# Patient Record
Sex: Male | Born: 1965 | Hispanic: No | Marital: Single | Smoking: Never smoker
Health system: Southern US, Community
[De-identification: ages and names within clinical notes are randomized; demographics above are authoritative.]

## PROBLEM LIST (undated history)

## (undated) DIAGNOSIS — G4730 Sleep apnea, unspecified: Secondary | ICD-10-CM

## (undated) DIAGNOSIS — L570 Actinic keratosis: Secondary | ICD-10-CM

## (undated) DIAGNOSIS — F419 Anxiety disorder, unspecified: Secondary | ICD-10-CM

## (undated) DIAGNOSIS — F32A Depression, unspecified: Secondary | ICD-10-CM

## (undated) DIAGNOSIS — G2581 Restless legs syndrome: Secondary | ICD-10-CM

## (undated) DIAGNOSIS — K219 Gastro-esophageal reflux disease without esophagitis: Secondary | ICD-10-CM

## (undated) DIAGNOSIS — F329 Major depressive disorder, single episode, unspecified: Secondary | ICD-10-CM

## (undated) DIAGNOSIS — F79 Unspecified intellectual disabilities: Secondary | ICD-10-CM

## (undated) HISTORY — PX: OTHER SURGICAL HISTORY: SHX169

## (undated) HISTORY — PX: NOSE SURGERY: SHX723

## (undated) HISTORY — DX: Sleep apnea, unspecified: G47.30

## (undated) HISTORY — DX: Anxiety disorder, unspecified: F41.9

## (undated) HISTORY — PX: COLONOSCOPY: SHX174

## (undated) HISTORY — DX: Restless legs syndrome: G25.81

## (undated) HISTORY — DX: Depression, unspecified: F32.A

## (undated) HISTORY — DX: Major depressive disorder, single episode, unspecified: F32.9

---
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
## 2004-04-20 ENCOUNTER — Ambulatory Visit: Payer: Self-pay | Admitting: Family Medicine

## 2004-04-28 ENCOUNTER — Ambulatory Visit: Payer: Self-pay | Admitting: Family Medicine

## 2004-05-28 ENCOUNTER — Ambulatory Visit: Payer: Self-pay | Admitting: Family Medicine

## 2004-06-30 ENCOUNTER — Ambulatory Visit: Payer: Self-pay | Admitting: General Surgery

## 2004-07-01 ENCOUNTER — Ambulatory Visit: Payer: Self-pay | Admitting: Family Medicine

## 2004-07-12 ENCOUNTER — Emergency Department: Payer: Self-pay | Admitting: Emergency Medicine

## 2004-11-03 ENCOUNTER — Ambulatory Visit: Payer: Self-pay | Admitting: Psychiatry

## 2008-05-09 ENCOUNTER — Ambulatory Visit: Payer: Self-pay | Admitting: Gastroenterology

## 2008-11-15 ENCOUNTER — Ambulatory Visit: Payer: Self-pay | Admitting: Family Medicine

## 2008-11-27 ENCOUNTER — Encounter: Payer: Self-pay | Admitting: Family Medicine

## 2010-05-08 ENCOUNTER — Ambulatory Visit: Payer: Self-pay

## 2010-05-28 ENCOUNTER — Ambulatory Visit: Payer: Self-pay | Admitting: Family Medicine

## 2010-07-26 ENCOUNTER — Emergency Department: Payer: Self-pay | Admitting: Emergency Medicine

## 2010-11-11 ENCOUNTER — Ambulatory Visit: Payer: Self-pay | Admitting: Neurology

## 2010-11-20 ENCOUNTER — Ambulatory Visit: Payer: Self-pay | Admitting: Neurology

## 2011-07-01 DIAGNOSIS — J309 Allergic rhinitis, unspecified: Secondary | ICD-10-CM | POA: Diagnosis not present

## 2011-07-06 DIAGNOSIS — M79609 Pain in unspecified limb: Secondary | ICD-10-CM | POA: Diagnosis not present

## 2011-07-06 DIAGNOSIS — B351 Tinea unguium: Secondary | ICD-10-CM | POA: Diagnosis not present

## 2011-07-08 DIAGNOSIS — J309 Allergic rhinitis, unspecified: Secondary | ICD-10-CM | POA: Diagnosis not present

## 2011-07-09 DIAGNOSIS — E559 Vitamin D deficiency, unspecified: Secondary | ICD-10-CM | POA: Diagnosis not present

## 2011-07-09 DIAGNOSIS — E782 Mixed hyperlipidemia: Secondary | ICD-10-CM | POA: Diagnosis not present

## 2011-07-09 DIAGNOSIS — Z0000 Encounter for general adult medical examination without abnormal findings: Secondary | ICD-10-CM | POA: Diagnosis not present

## 2011-07-15 DIAGNOSIS — J309 Allergic rhinitis, unspecified: Secondary | ICD-10-CM | POA: Diagnosis not present

## 2011-07-22 DIAGNOSIS — G4730 Sleep apnea, unspecified: Secondary | ICD-10-CM | POA: Diagnosis not present

## 2011-07-22 DIAGNOSIS — E785 Hyperlipidemia, unspecified: Secondary | ICD-10-CM | POA: Diagnosis not present

## 2011-07-22 DIAGNOSIS — F319 Bipolar disorder, unspecified: Secondary | ICD-10-CM | POA: Diagnosis not present

## 2011-07-22 DIAGNOSIS — G40309 Generalized idiopathic epilepsy and epileptic syndromes, not intractable, without status epilepticus: Secondary | ICD-10-CM | POA: Diagnosis not present

## 2011-07-22 DIAGNOSIS — R6250 Unspecified lack of expected normal physiological development in childhood: Secondary | ICD-10-CM | POA: Diagnosis not present

## 2011-07-22 DIAGNOSIS — Z0000 Encounter for general adult medical examination without abnormal findings: Secondary | ICD-10-CM | POA: Diagnosis not present

## 2011-07-22 DIAGNOSIS — J309 Allergic rhinitis, unspecified: Secondary | ICD-10-CM | POA: Diagnosis not present

## 2011-07-22 DIAGNOSIS — E559 Vitamin D deficiency, unspecified: Secondary | ICD-10-CM | POA: Diagnosis not present

## 2011-07-22 DIAGNOSIS — R5381 Other malaise: Secondary | ICD-10-CM | POA: Diagnosis not present

## 2011-07-22 DIAGNOSIS — G4710 Hypersomnia, unspecified: Secondary | ICD-10-CM | POA: Diagnosis not present

## 2011-07-29 DIAGNOSIS — J309 Allergic rhinitis, unspecified: Secondary | ICD-10-CM | POA: Diagnosis not present

## 2011-08-05 DIAGNOSIS — J309 Allergic rhinitis, unspecified: Secondary | ICD-10-CM | POA: Diagnosis not present

## 2011-08-12 DIAGNOSIS — J309 Allergic rhinitis, unspecified: Secondary | ICD-10-CM | POA: Diagnosis not present

## 2011-08-19 DIAGNOSIS — J309 Allergic rhinitis, unspecified: Secondary | ICD-10-CM | POA: Diagnosis not present

## 2011-08-26 DIAGNOSIS — J309 Allergic rhinitis, unspecified: Secondary | ICD-10-CM | POA: Diagnosis not present

## 2011-09-02 DIAGNOSIS — J309 Allergic rhinitis, unspecified: Secondary | ICD-10-CM | POA: Diagnosis not present

## 2011-09-07 DIAGNOSIS — J309 Allergic rhinitis, unspecified: Secondary | ICD-10-CM | POA: Diagnosis not present

## 2011-09-07 DIAGNOSIS — E669 Obesity, unspecified: Secondary | ICD-10-CM | POA: Diagnosis not present

## 2011-09-09 DIAGNOSIS — J309 Allergic rhinitis, unspecified: Secondary | ICD-10-CM | POA: Diagnosis not present

## 2011-09-16 DIAGNOSIS — J309 Allergic rhinitis, unspecified: Secondary | ICD-10-CM | POA: Diagnosis not present

## 2011-09-23 DIAGNOSIS — J309 Allergic rhinitis, unspecified: Secondary | ICD-10-CM | POA: Diagnosis not present

## 2011-09-30 DIAGNOSIS — J309 Allergic rhinitis, unspecified: Secondary | ICD-10-CM | POA: Diagnosis not present

## 2011-10-01 DIAGNOSIS — G4720 Circadian rhythm sleep disorder, unspecified type: Secondary | ICD-10-CM | POA: Diagnosis not present

## 2011-10-01 DIAGNOSIS — G4730 Sleep apnea, unspecified: Secondary | ICD-10-CM | POA: Diagnosis not present

## 2011-10-06 DIAGNOSIS — M79609 Pain in unspecified limb: Secondary | ICD-10-CM | POA: Diagnosis not present

## 2011-10-06 DIAGNOSIS — B351 Tinea unguium: Secondary | ICD-10-CM | POA: Diagnosis not present

## 2011-10-07 DIAGNOSIS — G4720 Circadian rhythm sleep disorder, unspecified type: Secondary | ICD-10-CM | POA: Diagnosis not present

## 2011-10-07 DIAGNOSIS — G4710 Hypersomnia, unspecified: Secondary | ICD-10-CM | POA: Diagnosis not present

## 2011-10-07 DIAGNOSIS — G4730 Sleep apnea, unspecified: Secondary | ICD-10-CM | POA: Diagnosis not present

## 2011-10-13 DIAGNOSIS — G4710 Hypersomnia, unspecified: Secondary | ICD-10-CM | POA: Diagnosis not present

## 2011-10-14 DIAGNOSIS — J309 Allergic rhinitis, unspecified: Secondary | ICD-10-CM | POA: Diagnosis not present

## 2011-10-19 DIAGNOSIS — G4733 Obstructive sleep apnea (adult) (pediatric): Secondary | ICD-10-CM | POA: Diagnosis not present

## 2011-10-21 DIAGNOSIS — G4710 Hypersomnia, unspecified: Secondary | ICD-10-CM | POA: Diagnosis not present

## 2011-10-21 DIAGNOSIS — G4730 Sleep apnea, unspecified: Secondary | ICD-10-CM | POA: Diagnosis not present

## 2011-10-21 DIAGNOSIS — F319 Bipolar disorder, unspecified: Secondary | ICD-10-CM | POA: Diagnosis not present

## 2011-10-21 DIAGNOSIS — E785 Hyperlipidemia, unspecified: Secondary | ICD-10-CM | POA: Diagnosis not present

## 2011-10-21 DIAGNOSIS — R6250 Unspecified lack of expected normal physiological development in childhood: Secondary | ICD-10-CM | POA: Diagnosis not present

## 2011-10-21 DIAGNOSIS — G40909 Epilepsy, unspecified, not intractable, without status epilepticus: Secondary | ICD-10-CM | POA: Diagnosis not present

## 2011-10-21 DIAGNOSIS — J309 Allergic rhinitis, unspecified: Secondary | ICD-10-CM | POA: Diagnosis not present

## 2011-10-28 DIAGNOSIS — J309 Allergic rhinitis, unspecified: Secondary | ICD-10-CM | POA: Diagnosis not present

## 2011-11-03 DIAGNOSIS — G4710 Hypersomnia, unspecified: Secondary | ICD-10-CM | POA: Diagnosis not present

## 2011-11-03 DIAGNOSIS — G4730 Sleep apnea, unspecified: Secondary | ICD-10-CM | POA: Diagnosis not present

## 2011-11-04 DIAGNOSIS — J309 Allergic rhinitis, unspecified: Secondary | ICD-10-CM | POA: Diagnosis not present

## 2011-11-18 DIAGNOSIS — J309 Allergic rhinitis, unspecified: Secondary | ICD-10-CM | POA: Diagnosis not present

## 2011-11-25 DIAGNOSIS — G4720 Circadian rhythm sleep disorder, unspecified type: Secondary | ICD-10-CM | POA: Diagnosis not present

## 2011-11-25 DIAGNOSIS — G4730 Sleep apnea, unspecified: Secondary | ICD-10-CM | POA: Diagnosis not present

## 2011-11-29 DIAGNOSIS — F319 Bipolar disorder, unspecified: Secondary | ICD-10-CM | POA: Diagnosis not present

## 2011-11-29 DIAGNOSIS — J309 Allergic rhinitis, unspecified: Secondary | ICD-10-CM | POA: Diagnosis not present

## 2011-11-29 DIAGNOSIS — Z0000 Encounter for general adult medical examination without abnormal findings: Secondary | ICD-10-CM | POA: Diagnosis not present

## 2011-11-29 DIAGNOSIS — R300 Dysuria: Secondary | ICD-10-CM | POA: Diagnosis not present

## 2011-11-29 DIAGNOSIS — G40309 Generalized idiopathic epilepsy and epileptic syndromes, not intractable, without status epilepticus: Secondary | ICD-10-CM | POA: Diagnosis not present

## 2011-11-29 DIAGNOSIS — E785 Hyperlipidemia, unspecified: Secondary | ICD-10-CM | POA: Diagnosis not present

## 2011-11-29 DIAGNOSIS — G4733 Obstructive sleep apnea (adult) (pediatric): Secondary | ICD-10-CM | POA: Diagnosis not present

## 2011-12-01 DIAGNOSIS — M722 Plantar fascial fibromatosis: Secondary | ICD-10-CM | POA: Diagnosis not present

## 2011-12-02 DIAGNOSIS — J309 Allergic rhinitis, unspecified: Secondary | ICD-10-CM | POA: Diagnosis not present

## 2011-12-16 DIAGNOSIS — J309 Allergic rhinitis, unspecified: Secondary | ICD-10-CM | POA: Diagnosis not present

## 2011-12-29 DIAGNOSIS — J309 Allergic rhinitis, unspecified: Secondary | ICD-10-CM | POA: Diagnosis not present

## 2011-12-29 DIAGNOSIS — M722 Plantar fascial fibromatosis: Secondary | ICD-10-CM | POA: Diagnosis not present

## 2012-01-12 DIAGNOSIS — B351 Tinea unguium: Secondary | ICD-10-CM | POA: Diagnosis not present

## 2012-01-12 DIAGNOSIS — M79609 Pain in unspecified limb: Secondary | ICD-10-CM | POA: Diagnosis not present

## 2012-01-13 DIAGNOSIS — J309 Allergic rhinitis, unspecified: Secondary | ICD-10-CM | POA: Diagnosis not present

## 2012-01-28 DIAGNOSIS — E785 Hyperlipidemia, unspecified: Secondary | ICD-10-CM | POA: Diagnosis not present

## 2012-01-28 DIAGNOSIS — G40309 Generalized idiopathic epilepsy and epileptic syndromes, not intractable, without status epilepticus: Secondary | ICD-10-CM | POA: Diagnosis not present

## 2012-01-28 DIAGNOSIS — R6250 Unspecified lack of expected normal physiological development in childhood: Secondary | ICD-10-CM | POA: Diagnosis not present

## 2012-01-28 DIAGNOSIS — J309 Allergic rhinitis, unspecified: Secondary | ICD-10-CM | POA: Diagnosis not present

## 2012-01-28 DIAGNOSIS — R5381 Other malaise: Secondary | ICD-10-CM | POA: Diagnosis not present

## 2012-01-28 DIAGNOSIS — G4733 Obstructive sleep apnea (adult) (pediatric): Secondary | ICD-10-CM | POA: Diagnosis not present

## 2012-01-28 DIAGNOSIS — R5383 Other fatigue: Secondary | ICD-10-CM | POA: Diagnosis not present

## 2012-02-09 DIAGNOSIS — G4730 Sleep apnea, unspecified: Secondary | ICD-10-CM | POA: Diagnosis not present

## 2012-02-09 DIAGNOSIS — G4710 Hypersomnia, unspecified: Secondary | ICD-10-CM | POA: Diagnosis not present

## 2012-02-10 DIAGNOSIS — J309 Allergic rhinitis, unspecified: Secondary | ICD-10-CM | POA: Diagnosis not present

## 2012-02-16 DIAGNOSIS — G4720 Circadian rhythm sleep disorder, unspecified type: Secondary | ICD-10-CM | POA: Diagnosis not present

## 2012-02-16 DIAGNOSIS — G4710 Hypersomnia, unspecified: Secondary | ICD-10-CM | POA: Diagnosis not present

## 2012-02-16 DIAGNOSIS — G4730 Sleep apnea, unspecified: Secondary | ICD-10-CM | POA: Diagnosis not present

## 2012-03-02 DIAGNOSIS — J309 Allergic rhinitis, unspecified: Secondary | ICD-10-CM | POA: Diagnosis not present

## 2012-03-09 DIAGNOSIS — G4720 Circadian rhythm sleep disorder, unspecified type: Secondary | ICD-10-CM | POA: Diagnosis not present

## 2012-03-09 DIAGNOSIS — G4710 Hypersomnia, unspecified: Secondary | ICD-10-CM | POA: Diagnosis not present

## 2012-03-15 DIAGNOSIS — G4730 Sleep apnea, unspecified: Secondary | ICD-10-CM | POA: Diagnosis not present

## 2012-03-16 DIAGNOSIS — J309 Allergic rhinitis, unspecified: Secondary | ICD-10-CM | POA: Diagnosis not present

## 2012-03-30 DIAGNOSIS — J309 Allergic rhinitis, unspecified: Secondary | ICD-10-CM | POA: Diagnosis not present

## 2012-04-13 DIAGNOSIS — J309 Allergic rhinitis, unspecified: Secondary | ICD-10-CM | POA: Diagnosis not present

## 2012-04-21 DIAGNOSIS — F3112 Bipolar disorder, current episode manic without psychotic features, moderate: Secondary | ICD-10-CM | POA: Diagnosis not present

## 2012-04-26 DIAGNOSIS — M79609 Pain in unspecified limb: Secondary | ICD-10-CM | POA: Diagnosis not present

## 2012-04-26 DIAGNOSIS — L97509 Non-pressure chronic ulcer of other part of unspecified foot with unspecified severity: Secondary | ICD-10-CM | POA: Diagnosis not present

## 2012-04-26 DIAGNOSIS — Q666 Other congenital valgus deformities of feet: Secondary | ICD-10-CM | POA: Diagnosis not present

## 2012-04-26 DIAGNOSIS — M216X9 Other acquired deformities of unspecified foot: Secondary | ICD-10-CM | POA: Diagnosis not present

## 2012-04-26 DIAGNOSIS — B351 Tinea unguium: Secondary | ICD-10-CM | POA: Diagnosis not present

## 2012-04-26 DIAGNOSIS — M76829 Posterior tibial tendinitis, unspecified leg: Secondary | ICD-10-CM | POA: Diagnosis not present

## 2012-04-27 DIAGNOSIS — J309 Allergic rhinitis, unspecified: Secondary | ICD-10-CM | POA: Diagnosis not present

## 2012-05-11 DIAGNOSIS — J309 Allergic rhinitis, unspecified: Secondary | ICD-10-CM | POA: Diagnosis not present

## 2012-05-24 DIAGNOSIS — J309 Allergic rhinitis, unspecified: Secondary | ICD-10-CM | POA: Diagnosis not present

## 2012-05-30 DIAGNOSIS — G40309 Generalized idiopathic epilepsy and epileptic syndromes, not intractable, without status epilepticus: Secondary | ICD-10-CM | POA: Diagnosis not present

## 2012-05-30 DIAGNOSIS — E785 Hyperlipidemia, unspecified: Secondary | ICD-10-CM | POA: Diagnosis not present

## 2012-05-30 DIAGNOSIS — G4710 Hypersomnia, unspecified: Secondary | ICD-10-CM | POA: Diagnosis not present

## 2012-05-30 DIAGNOSIS — G40909 Epilepsy, unspecified, not intractable, without status epilepticus: Secondary | ICD-10-CM | POA: Diagnosis not present

## 2012-05-30 DIAGNOSIS — G4733 Obstructive sleep apnea (adult) (pediatric): Secondary | ICD-10-CM | POA: Diagnosis not present

## 2012-05-30 DIAGNOSIS — R5383 Other fatigue: Secondary | ICD-10-CM | POA: Diagnosis not present

## 2012-06-15 DIAGNOSIS — J309 Allergic rhinitis, unspecified: Secondary | ICD-10-CM | POA: Diagnosis not present

## 2012-06-29 DIAGNOSIS — J309 Allergic rhinitis, unspecified: Secondary | ICD-10-CM | POA: Diagnosis not present

## 2012-07-13 DIAGNOSIS — J309 Allergic rhinitis, unspecified: Secondary | ICD-10-CM | POA: Diagnosis not present

## 2012-07-20 DIAGNOSIS — G4730 Sleep apnea, unspecified: Secondary | ICD-10-CM | POA: Diagnosis not present

## 2012-07-20 DIAGNOSIS — G4710 Hypersomnia, unspecified: Secondary | ICD-10-CM | POA: Diagnosis not present

## 2012-07-20 DIAGNOSIS — G4720 Circadian rhythm sleep disorder, unspecified type: Secondary | ICD-10-CM | POA: Diagnosis not present

## 2012-07-20 DIAGNOSIS — G4733 Obstructive sleep apnea (adult) (pediatric): Secondary | ICD-10-CM | POA: Diagnosis not present

## 2012-07-20 DIAGNOSIS — G2581 Restless legs syndrome: Secondary | ICD-10-CM | POA: Diagnosis not present

## 2012-07-27 DIAGNOSIS — J309 Allergic rhinitis, unspecified: Secondary | ICD-10-CM | POA: Diagnosis not present

## 2012-08-02 DIAGNOSIS — B351 Tinea unguium: Secondary | ICD-10-CM | POA: Diagnosis not present

## 2012-08-02 DIAGNOSIS — M79609 Pain in unspecified limb: Secondary | ICD-10-CM | POA: Diagnosis not present

## 2012-08-07 DIAGNOSIS — G4710 Hypersomnia, unspecified: Secondary | ICD-10-CM | POA: Diagnosis not present

## 2012-08-07 DIAGNOSIS — G40309 Generalized idiopathic epilepsy and epileptic syndromes, not intractable, without status epilepticus: Secondary | ICD-10-CM | POA: Diagnosis not present

## 2012-08-07 DIAGNOSIS — I10 Essential (primary) hypertension: Secondary | ICD-10-CM | POA: Diagnosis not present

## 2012-08-07 DIAGNOSIS — E069 Thyroiditis, unspecified: Secondary | ICD-10-CM | POA: Diagnosis not present

## 2012-08-07 DIAGNOSIS — R6250 Unspecified lack of expected normal physiological development in childhood: Secondary | ICD-10-CM | POA: Diagnosis not present

## 2012-08-07 DIAGNOSIS — R1310 Dysphagia, unspecified: Secondary | ICD-10-CM | POA: Diagnosis not present

## 2012-08-07 DIAGNOSIS — G4730 Sleep apnea, unspecified: Secondary | ICD-10-CM | POA: Diagnosis not present

## 2012-08-07 DIAGNOSIS — R269 Unspecified abnormalities of gait and mobility: Secondary | ICD-10-CM | POA: Diagnosis not present

## 2012-08-17 DIAGNOSIS — J309 Allergic rhinitis, unspecified: Secondary | ICD-10-CM | POA: Diagnosis not present

## 2012-08-24 ENCOUNTER — Ambulatory Visit: Payer: Self-pay

## 2012-08-24 DIAGNOSIS — R1310 Dysphagia, unspecified: Secondary | ICD-10-CM | POA: Diagnosis not present

## 2012-08-31 DIAGNOSIS — J309 Allergic rhinitis, unspecified: Secondary | ICD-10-CM | POA: Diagnosis not present

## 2012-09-05 DIAGNOSIS — R1310 Dysphagia, unspecified: Secondary | ICD-10-CM | POA: Diagnosis not present

## 2012-09-05 DIAGNOSIS — E069 Thyroiditis, unspecified: Secondary | ICD-10-CM | POA: Diagnosis not present

## 2012-09-08 DIAGNOSIS — E039 Hypothyroidism, unspecified: Secondary | ICD-10-CM | POA: Diagnosis not present

## 2012-09-08 DIAGNOSIS — F3112 Bipolar disorder, current episode manic without psychotic features, moderate: Secondary | ICD-10-CM | POA: Diagnosis not present

## 2012-09-14 DIAGNOSIS — J309 Allergic rhinitis, unspecified: Secondary | ICD-10-CM | POA: Diagnosis not present

## 2012-09-28 DIAGNOSIS — J309 Allergic rhinitis, unspecified: Secondary | ICD-10-CM | POA: Diagnosis not present

## 2012-10-02 DIAGNOSIS — R1310 Dysphagia, unspecified: Secondary | ICD-10-CM | POA: Diagnosis not present

## 2012-10-02 DIAGNOSIS — E782 Mixed hyperlipidemia: Secondary | ICD-10-CM | POA: Diagnosis not present

## 2012-10-02 DIAGNOSIS — G2581 Restless legs syndrome: Secondary | ICD-10-CM | POA: Diagnosis not present

## 2012-10-02 DIAGNOSIS — E069 Thyroiditis, unspecified: Secondary | ICD-10-CM | POA: Diagnosis not present

## 2012-10-03 DIAGNOSIS — E039 Hypothyroidism, unspecified: Secondary | ICD-10-CM | POA: Diagnosis not present

## 2012-10-05 ENCOUNTER — Ambulatory Visit: Payer: Self-pay | Admitting: Internal Medicine

## 2012-10-12 DIAGNOSIS — J309 Allergic rhinitis, unspecified: Secondary | ICD-10-CM | POA: Diagnosis not present

## 2012-10-17 DIAGNOSIS — R6889 Other general symptoms and signs: Secondary | ICD-10-CM | POA: Diagnosis not present

## 2012-10-17 DIAGNOSIS — K219 Gastro-esophageal reflux disease without esophagitis: Secondary | ICD-10-CM | POA: Diagnosis not present

## 2012-10-17 DIAGNOSIS — H9319 Tinnitus, unspecified ear: Secondary | ICD-10-CM | POA: Diagnosis not present

## 2012-10-17 DIAGNOSIS — E041 Nontoxic single thyroid nodule: Secondary | ICD-10-CM | POA: Diagnosis not present

## 2012-10-17 DIAGNOSIS — R1310 Dysphagia, unspecified: Secondary | ICD-10-CM | POA: Diagnosis not present

## 2012-10-25 DIAGNOSIS — M79609 Pain in unspecified limb: Secondary | ICD-10-CM | POA: Diagnosis not present

## 2012-10-25 DIAGNOSIS — J309 Allergic rhinitis, unspecified: Secondary | ICD-10-CM | POA: Diagnosis not present

## 2012-10-25 DIAGNOSIS — B351 Tinea unguium: Secondary | ICD-10-CM | POA: Diagnosis not present

## 2012-11-02 DIAGNOSIS — G4733 Obstructive sleep apnea (adult) (pediatric): Secondary | ICD-10-CM | POA: Diagnosis not present

## 2012-11-02 DIAGNOSIS — J309 Allergic rhinitis, unspecified: Secondary | ICD-10-CM | POA: Diagnosis not present

## 2012-11-02 DIAGNOSIS — M25559 Pain in unspecified hip: Secondary | ICD-10-CM | POA: Diagnosis not present

## 2012-11-02 DIAGNOSIS — G40909 Epilepsy, unspecified, not intractable, without status epilepticus: Secondary | ICD-10-CM | POA: Diagnosis not present

## 2012-11-02 DIAGNOSIS — I10 Essential (primary) hypertension: Secondary | ICD-10-CM | POA: Diagnosis not present

## 2012-11-03 ENCOUNTER — Ambulatory Visit: Payer: Self-pay

## 2012-11-03 DIAGNOSIS — M25559 Pain in unspecified hip: Secondary | ICD-10-CM | POA: Diagnosis not present

## 2012-11-14 DIAGNOSIS — J3489 Other specified disorders of nose and nasal sinuses: Secondary | ICD-10-CM | POA: Diagnosis not present

## 2012-11-14 DIAGNOSIS — J343 Hypertrophy of nasal turbinates: Secondary | ICD-10-CM | POA: Diagnosis not present

## 2012-11-14 DIAGNOSIS — J342 Deviated nasal septum: Secondary | ICD-10-CM | POA: Diagnosis not present

## 2012-11-14 DIAGNOSIS — J328 Other chronic sinusitis: Secondary | ICD-10-CM | POA: Diagnosis not present

## 2012-11-16 DIAGNOSIS — J309 Allergic rhinitis, unspecified: Secondary | ICD-10-CM | POA: Diagnosis not present

## 2012-11-30 DIAGNOSIS — J309 Allergic rhinitis, unspecified: Secondary | ICD-10-CM | POA: Diagnosis not present

## 2012-12-04 DIAGNOSIS — R5383 Other fatigue: Secondary | ICD-10-CM | POA: Diagnosis not present

## 2012-12-04 DIAGNOSIS — G4733 Obstructive sleep apnea (adult) (pediatric): Secondary | ICD-10-CM | POA: Diagnosis not present

## 2012-12-04 DIAGNOSIS — Z006 Encounter for examination for normal comparison and control in clinical research program: Secondary | ICD-10-CM | POA: Diagnosis not present

## 2012-12-04 DIAGNOSIS — G40909 Epilepsy, unspecified, not intractable, without status epilepticus: Secondary | ICD-10-CM | POA: Diagnosis not present

## 2012-12-04 DIAGNOSIS — I10 Essential (primary) hypertension: Secondary | ICD-10-CM | POA: Diagnosis not present

## 2012-12-04 DIAGNOSIS — Z0000 Encounter for general adult medical examination without abnormal findings: Secondary | ICD-10-CM | POA: Diagnosis not present

## 2012-12-04 DIAGNOSIS — E669 Obesity, unspecified: Secondary | ICD-10-CM | POA: Diagnosis not present

## 2012-12-04 DIAGNOSIS — J310 Chronic rhinitis: Secondary | ICD-10-CM | POA: Diagnosis not present

## 2012-12-07 ENCOUNTER — Ambulatory Visit: Payer: Self-pay | Admitting: Otolaryngology

## 2012-12-07 ENCOUNTER — Emergency Department: Payer: Self-pay | Admitting: Emergency Medicine

## 2012-12-07 DIAGNOSIS — Z8249 Family history of ischemic heart disease and other diseases of the circulatory system: Secondary | ICD-10-CM | POA: Diagnosis not present

## 2012-12-07 DIAGNOSIS — R609 Edema, unspecified: Secondary | ICD-10-CM | POA: Diagnosis not present

## 2012-12-07 DIAGNOSIS — J3489 Other specified disorders of nose and nasal sinuses: Secondary | ICD-10-CM | POA: Diagnosis not present

## 2012-12-07 DIAGNOSIS — Z79899 Other long term (current) drug therapy: Secondary | ICD-10-CM | POA: Diagnosis not present

## 2012-12-07 DIAGNOSIS — R0601 Orthopnea: Secondary | ICD-10-CM | POA: Diagnosis not present

## 2012-12-07 DIAGNOSIS — J342 Deviated nasal septum: Secondary | ICD-10-CM | POA: Diagnosis not present

## 2012-12-07 DIAGNOSIS — J343 Hypertrophy of nasal turbinates: Secondary | ICD-10-CM | POA: Diagnosis not present

## 2012-12-07 DIAGNOSIS — Z833 Family history of diabetes mellitus: Secondary | ICD-10-CM | POA: Diagnosis not present

## 2012-12-07 DIAGNOSIS — E041 Nontoxic single thyroid nodule: Secondary | ICD-10-CM | POA: Diagnosis not present

## 2012-12-07 DIAGNOSIS — K219 Gastro-esophageal reflux disease without esophagitis: Secondary | ICD-10-CM | POA: Diagnosis not present

## 2012-12-07 DIAGNOSIS — G4730 Sleep apnea, unspecified: Secondary | ICD-10-CM | POA: Diagnosis not present

## 2012-12-19 DIAGNOSIS — J3489 Other specified disorders of nose and nasal sinuses: Secondary | ICD-10-CM | POA: Diagnosis not present

## 2012-12-19 DIAGNOSIS — J343 Hypertrophy of nasal turbinates: Secondary | ICD-10-CM | POA: Diagnosis not present

## 2012-12-19 DIAGNOSIS — J342 Deviated nasal septum: Secondary | ICD-10-CM | POA: Diagnosis not present

## 2012-12-21 DIAGNOSIS — J309 Allergic rhinitis, unspecified: Secondary | ICD-10-CM | POA: Diagnosis not present

## 2012-12-28 DIAGNOSIS — G4733 Obstructive sleep apnea (adult) (pediatric): Secondary | ICD-10-CM | POA: Diagnosis not present

## 2012-12-28 DIAGNOSIS — G4720 Circadian rhythm sleep disorder, unspecified type: Secondary | ICD-10-CM | POA: Diagnosis not present

## 2012-12-28 DIAGNOSIS — G4710 Hypersomnia, unspecified: Secondary | ICD-10-CM | POA: Diagnosis not present

## 2012-12-28 DIAGNOSIS — G4730 Sleep apnea, unspecified: Secondary | ICD-10-CM | POA: Diagnosis not present

## 2013-01-04 DIAGNOSIS — J309 Allergic rhinitis, unspecified: Secondary | ICD-10-CM | POA: Diagnosis not present

## 2013-01-18 DIAGNOSIS — J309 Allergic rhinitis, unspecified: Secondary | ICD-10-CM | POA: Diagnosis not present

## 2013-01-23 DIAGNOSIS — J342 Deviated nasal septum: Secondary | ICD-10-CM | POA: Diagnosis not present

## 2013-01-23 DIAGNOSIS — J328 Other chronic sinusitis: Secondary | ICD-10-CM | POA: Diagnosis not present

## 2013-01-23 DIAGNOSIS — J3489 Other specified disorders of nose and nasal sinuses: Secondary | ICD-10-CM | POA: Diagnosis not present

## 2013-01-24 DIAGNOSIS — M79609 Pain in unspecified limb: Secondary | ICD-10-CM | POA: Diagnosis not present

## 2013-01-24 DIAGNOSIS — B351 Tinea unguium: Secondary | ICD-10-CM | POA: Diagnosis not present

## 2013-02-01 DIAGNOSIS — J309 Allergic rhinitis, unspecified: Secondary | ICD-10-CM | POA: Diagnosis not present

## 2013-02-02 DIAGNOSIS — F3112 Bipolar disorder, current episode manic without psychotic features, moderate: Secondary | ICD-10-CM | POA: Diagnosis not present

## 2013-02-15 DIAGNOSIS — J309 Allergic rhinitis, unspecified: Secondary | ICD-10-CM | POA: Diagnosis not present

## 2013-03-08 DIAGNOSIS — J309 Allergic rhinitis, unspecified: Secondary | ICD-10-CM | POA: Diagnosis not present

## 2013-03-22 DIAGNOSIS — J309 Allergic rhinitis, unspecified: Secondary | ICD-10-CM | POA: Diagnosis not present

## 2013-04-05 DIAGNOSIS — J309 Allergic rhinitis, unspecified: Secondary | ICD-10-CM | POA: Diagnosis not present

## 2013-04-13 DIAGNOSIS — R1310 Dysphagia, unspecified: Secondary | ICD-10-CM | POA: Diagnosis not present

## 2013-04-13 DIAGNOSIS — J309 Allergic rhinitis, unspecified: Secondary | ICD-10-CM | POA: Diagnosis not present

## 2013-04-13 DIAGNOSIS — M19029 Primary osteoarthritis, unspecified elbow: Secondary | ICD-10-CM | POA: Diagnosis not present

## 2013-04-13 DIAGNOSIS — Z23 Encounter for immunization: Secondary | ICD-10-CM | POA: Diagnosis not present

## 2013-04-13 DIAGNOSIS — J449 Chronic obstructive pulmonary disease, unspecified: Secondary | ICD-10-CM | POA: Diagnosis not present

## 2013-04-13 DIAGNOSIS — G40909 Epilepsy, unspecified, not intractable, without status epilepticus: Secondary | ICD-10-CM | POA: Diagnosis not present

## 2013-04-13 DIAGNOSIS — R6250 Unspecified lack of expected normal physiological development in childhood: Secondary | ICD-10-CM | POA: Diagnosis not present

## 2013-04-18 ENCOUNTER — Ambulatory Visit: Payer: Self-pay | Admitting: Otolaryngology

## 2013-04-18 DIAGNOSIS — E079 Disorder of thyroid, unspecified: Secondary | ICD-10-CM | POA: Diagnosis not present

## 2013-04-18 DIAGNOSIS — E041 Nontoxic single thyroid nodule: Secondary | ICD-10-CM | POA: Diagnosis not present

## 2013-04-27 DIAGNOSIS — F3112 Bipolar disorder, current episode manic without psychotic features, moderate: Secondary | ICD-10-CM | POA: Diagnosis not present

## 2013-05-02 DIAGNOSIS — M79609 Pain in unspecified limb: Secondary | ICD-10-CM | POA: Diagnosis not present

## 2013-05-02 DIAGNOSIS — B351 Tinea unguium: Secondary | ICD-10-CM | POA: Diagnosis not present

## 2013-05-03 DIAGNOSIS — J309 Allergic rhinitis, unspecified: Secondary | ICD-10-CM | POA: Diagnosis not present

## 2013-05-17 DIAGNOSIS — J309 Allergic rhinitis, unspecified: Secondary | ICD-10-CM | POA: Diagnosis not present

## 2013-05-31 DIAGNOSIS — J309 Allergic rhinitis, unspecified: Secondary | ICD-10-CM | POA: Diagnosis not present

## 2013-06-14 DIAGNOSIS — J309 Allergic rhinitis, unspecified: Secondary | ICD-10-CM | POA: Diagnosis not present

## 2013-06-27 DIAGNOSIS — G4733 Obstructive sleep apnea (adult) (pediatric): Secondary | ICD-10-CM | POA: Diagnosis not present

## 2013-06-27 DIAGNOSIS — J310 Chronic rhinitis: Secondary | ICD-10-CM | POA: Diagnosis not present

## 2013-07-08 ENCOUNTER — Emergency Department: Payer: Self-pay | Admitting: Emergency Medicine

## 2013-07-08 DIAGNOSIS — R52 Pain, unspecified: Secondary | ICD-10-CM | POA: Diagnosis not present

## 2013-07-08 DIAGNOSIS — K219 Gastro-esophageal reflux disease without esophagitis: Secondary | ICD-10-CM | POA: Diagnosis not present

## 2013-07-08 DIAGNOSIS — Z79899 Other long term (current) drug therapy: Secondary | ICD-10-CM | POA: Diagnosis not present

## 2013-07-08 DIAGNOSIS — R079 Chest pain, unspecified: Secondary | ICD-10-CM | POA: Diagnosis not present

## 2013-07-08 DIAGNOSIS — E785 Hyperlipidemia, unspecified: Secondary | ICD-10-CM | POA: Diagnosis not present

## 2013-07-08 LAB — BASIC METABOLIC PANEL
ANION GAP: 6 — AB (ref 7–16)
BUN: 13 mg/dL (ref 7–18)
CO2: 27 mmol/L (ref 21–32)
Calcium, Total: 9.1 mg/dL (ref 8.5–10.1)
Chloride: 107 mmol/L (ref 98–107)
Creatinine: 0.94 mg/dL (ref 0.60–1.30)
EGFR (Non-African Amer.): >60
Glucose: 105 mg/dL — ABNORMAL HIGH (ref 65–99)
Osmolality: 280 (ref 275–301)
Potassium: 3.7 mmol/L (ref 3.5–5.1)
Sodium: 140 mmol/L (ref 136–145)

## 2013-07-08 LAB — CBC
HCT: 44.2 % (ref 40.0–52.0)
HGB: 14.9 g/dL (ref 13.0–18.0)
MCH: 29.2 pg (ref 26.0–34.0)
MCHC: 33.6 g/dL (ref 32.0–36.0)
MCV: 87 fL (ref 80–100)
Platelet: 246 10*3/uL (ref 150–440)
RBC: 5.09 10*6/uL (ref 4.40–5.90)
RDW: 14.1 % (ref 11.5–14.5)
WBC: 7.5 10*3/uL (ref 3.8–10.6)

## 2013-07-08 LAB — TROPONIN I

## 2013-07-12 DIAGNOSIS — J309 Allergic rhinitis, unspecified: Secondary | ICD-10-CM | POA: Diagnosis not present

## 2013-07-25 DIAGNOSIS — J309 Allergic rhinitis, unspecified: Secondary | ICD-10-CM | POA: Diagnosis not present

## 2013-07-25 DIAGNOSIS — G4710 Hypersomnia, unspecified: Secondary | ICD-10-CM | POA: Diagnosis not present

## 2013-07-27 DIAGNOSIS — F3112 Bipolar disorder, current episode manic without psychotic features, moderate: Secondary | ICD-10-CM | POA: Diagnosis not present

## 2013-08-07 DIAGNOSIS — R143 Flatulence: Secondary | ICD-10-CM | POA: Diagnosis not present

## 2013-08-07 DIAGNOSIS — R141 Gas pain: Secondary | ICD-10-CM | POA: Diagnosis not present

## 2013-08-07 DIAGNOSIS — H109 Unspecified conjunctivitis: Secondary | ICD-10-CM | POA: Diagnosis not present

## 2013-08-07 DIAGNOSIS — K219 Gastro-esophageal reflux disease without esophagitis: Secondary | ICD-10-CM | POA: Diagnosis not present

## 2013-08-07 DIAGNOSIS — R079 Chest pain, unspecified: Secondary | ICD-10-CM | POA: Diagnosis not present

## 2013-08-07 DIAGNOSIS — J309 Allergic rhinitis, unspecified: Secondary | ICD-10-CM | POA: Diagnosis not present

## 2013-08-08 DIAGNOSIS — B351 Tinea unguium: Secondary | ICD-10-CM | POA: Diagnosis not present

## 2013-08-08 DIAGNOSIS — M79609 Pain in unspecified limb: Secondary | ICD-10-CM | POA: Diagnosis not present

## 2013-08-24 DIAGNOSIS — J309 Allergic rhinitis, unspecified: Secondary | ICD-10-CM | POA: Diagnosis not present

## 2013-09-06 DIAGNOSIS — J309 Allergic rhinitis, unspecified: Secondary | ICD-10-CM | POA: Diagnosis not present

## 2013-09-11 DIAGNOSIS — R141 Gas pain: Secondary | ICD-10-CM | POA: Diagnosis not present

## 2013-09-11 DIAGNOSIS — I10 Essential (primary) hypertension: Secondary | ICD-10-CM | POA: Diagnosis not present

## 2013-09-11 DIAGNOSIS — K219 Gastro-esophageal reflux disease without esophagitis: Secondary | ICD-10-CM | POA: Diagnosis not present

## 2013-09-20 DIAGNOSIS — J309 Allergic rhinitis, unspecified: Secondary | ICD-10-CM | POA: Diagnosis not present

## 2013-10-04 DIAGNOSIS — J309 Allergic rhinitis, unspecified: Secondary | ICD-10-CM | POA: Diagnosis not present

## 2013-10-18 DIAGNOSIS — F329 Major depressive disorder, single episode, unspecified: Secondary | ICD-10-CM | POA: Diagnosis not present

## 2013-10-18 DIAGNOSIS — F3289 Other specified depressive episodes: Secondary | ICD-10-CM | POA: Diagnosis not present

## 2013-10-19 DIAGNOSIS — J309 Allergic rhinitis, unspecified: Secondary | ICD-10-CM | POA: Diagnosis not present

## 2013-10-26 DIAGNOSIS — F3289 Other specified depressive episodes: Secondary | ICD-10-CM | POA: Diagnosis not present

## 2013-10-26 DIAGNOSIS — F329 Major depressive disorder, single episode, unspecified: Secondary | ICD-10-CM | POA: Diagnosis not present

## 2013-10-31 DIAGNOSIS — G4710 Hypersomnia, unspecified: Secondary | ICD-10-CM | POA: Diagnosis not present

## 2013-10-31 DIAGNOSIS — J309 Allergic rhinitis, unspecified: Secondary | ICD-10-CM | POA: Diagnosis not present

## 2013-10-31 DIAGNOSIS — G4730 Sleep apnea, unspecified: Secondary | ICD-10-CM | POA: Diagnosis not present

## 2013-11-14 DIAGNOSIS — M79609 Pain in unspecified limb: Secondary | ICD-10-CM | POA: Diagnosis not present

## 2013-11-14 DIAGNOSIS — B351 Tinea unguium: Secondary | ICD-10-CM | POA: Diagnosis not present

## 2013-11-15 DIAGNOSIS — J309 Allergic rhinitis, unspecified: Secondary | ICD-10-CM | POA: Diagnosis not present

## 2013-12-06 DIAGNOSIS — J309 Allergic rhinitis, unspecified: Secondary | ICD-10-CM | POA: Diagnosis not present

## 2013-12-20 DIAGNOSIS — J309 Allergic rhinitis, unspecified: Secondary | ICD-10-CM | POA: Diagnosis not present

## 2013-12-26 DIAGNOSIS — G4710 Hypersomnia, unspecified: Secondary | ICD-10-CM | POA: Diagnosis not present

## 2013-12-26 DIAGNOSIS — J309 Allergic rhinitis, unspecified: Secondary | ICD-10-CM | POA: Diagnosis not present

## 2014-01-03 DIAGNOSIS — J309 Allergic rhinitis, unspecified: Secondary | ICD-10-CM | POA: Diagnosis not present

## 2014-01-08 DIAGNOSIS — E782 Mixed hyperlipidemia: Secondary | ICD-10-CM | POA: Diagnosis not present

## 2014-01-08 DIAGNOSIS — Z125 Encounter for screening for malignant neoplasm of prostate: Secondary | ICD-10-CM | POA: Diagnosis not present

## 2014-01-08 DIAGNOSIS — H109 Unspecified conjunctivitis: Secondary | ICD-10-CM | POA: Diagnosis not present

## 2014-01-08 DIAGNOSIS — R300 Dysuria: Secondary | ICD-10-CM | POA: Diagnosis not present

## 2014-01-08 DIAGNOSIS — G40309 Generalized idiopathic epilepsy and epileptic syndromes, not intractable, without status epilepticus: Secondary | ICD-10-CM | POA: Diagnosis not present

## 2014-01-08 DIAGNOSIS — G4730 Sleep apnea, unspecified: Secondary | ICD-10-CM | POA: Diagnosis not present

## 2014-01-08 DIAGNOSIS — Z0000 Encounter for general adult medical examination without abnormal findings: Secondary | ICD-10-CM | POA: Diagnosis not present

## 2014-01-08 DIAGNOSIS — G4710 Hypersomnia, unspecified: Secondary | ICD-10-CM | POA: Diagnosis not present

## 2014-01-08 DIAGNOSIS — J449 Chronic obstructive pulmonary disease, unspecified: Secondary | ICD-10-CM | POA: Diagnosis not present

## 2014-01-08 DIAGNOSIS — J309 Allergic rhinitis, unspecified: Secondary | ICD-10-CM | POA: Diagnosis not present

## 2014-01-15 DIAGNOSIS — R5381 Other malaise: Secondary | ICD-10-CM | POA: Diagnosis not present

## 2014-01-15 DIAGNOSIS — L0291 Cutaneous abscess, unspecified: Secondary | ICD-10-CM | POA: Diagnosis not present

## 2014-01-15 DIAGNOSIS — I889 Nonspecific lymphadenitis, unspecified: Secondary | ICD-10-CM | POA: Diagnosis not present

## 2014-01-15 DIAGNOSIS — L0390 Cellulitis, unspecified: Secondary | ICD-10-CM | POA: Diagnosis not present

## 2014-01-15 DIAGNOSIS — G40909 Epilepsy, unspecified, not intractable, without status epilepticus: Secondary | ICD-10-CM | POA: Diagnosis not present

## 2014-01-15 DIAGNOSIS — R5383 Other fatigue: Secondary | ICD-10-CM | POA: Diagnosis not present

## 2014-01-15 DIAGNOSIS — J309 Allergic rhinitis, unspecified: Secondary | ICD-10-CM | POA: Diagnosis not present

## 2014-01-15 DIAGNOSIS — I10 Essential (primary) hypertension: Secondary | ICD-10-CM | POA: Diagnosis not present

## 2014-01-15 DIAGNOSIS — G4730 Sleep apnea, unspecified: Secondary | ICD-10-CM | POA: Diagnosis not present

## 2014-01-15 DIAGNOSIS — G4710 Hypersomnia, unspecified: Secondary | ICD-10-CM | POA: Diagnosis not present

## 2014-01-15 DIAGNOSIS — R6250 Unspecified lack of expected normal physiological development in childhood: Secondary | ICD-10-CM | POA: Diagnosis not present

## 2014-01-17 DIAGNOSIS — J309 Allergic rhinitis, unspecified: Secondary | ICD-10-CM | POA: Diagnosis not present

## 2014-01-18 DIAGNOSIS — F3289 Other specified depressive episodes: Secondary | ICD-10-CM | POA: Diagnosis not present

## 2014-01-18 DIAGNOSIS — F329 Major depressive disorder, single episode, unspecified: Secondary | ICD-10-CM | POA: Diagnosis not present

## 2014-01-29 DIAGNOSIS — J309 Allergic rhinitis, unspecified: Secondary | ICD-10-CM | POA: Diagnosis not present

## 2014-02-13 DIAGNOSIS — G4730 Sleep apnea, unspecified: Secondary | ICD-10-CM | POA: Diagnosis not present

## 2014-02-13 DIAGNOSIS — G4710 Hypersomnia, unspecified: Secondary | ICD-10-CM | POA: Diagnosis not present

## 2014-02-13 DIAGNOSIS — J309 Allergic rhinitis, unspecified: Secondary | ICD-10-CM | POA: Diagnosis not present

## 2014-02-14 IMAGING — RF DG UGI W/ KUB
9 of 13 series · 15 of 21 positions shown · non-contrast
Comparison: none

REASON FOR EXAM: Dysphagia
COMMENTS:

[Series 1: fluoro_barium 2fps_bw · 0.18mm/px · 3 of 14 frames shown (1 of 9)]
[frame 3/14]
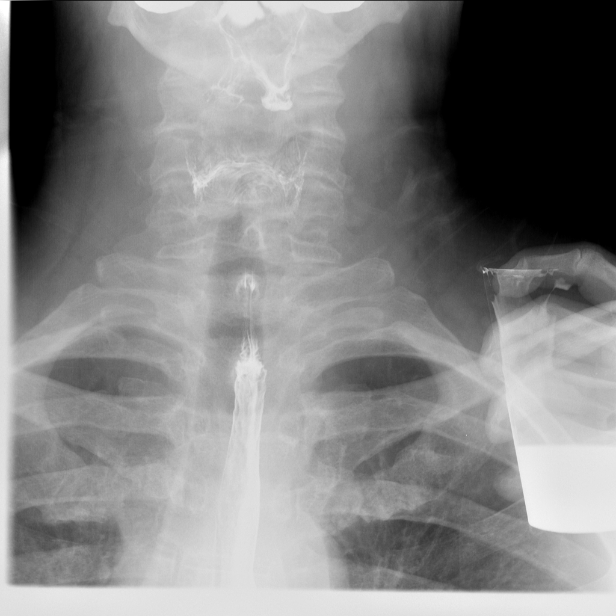
[frame 8/14]
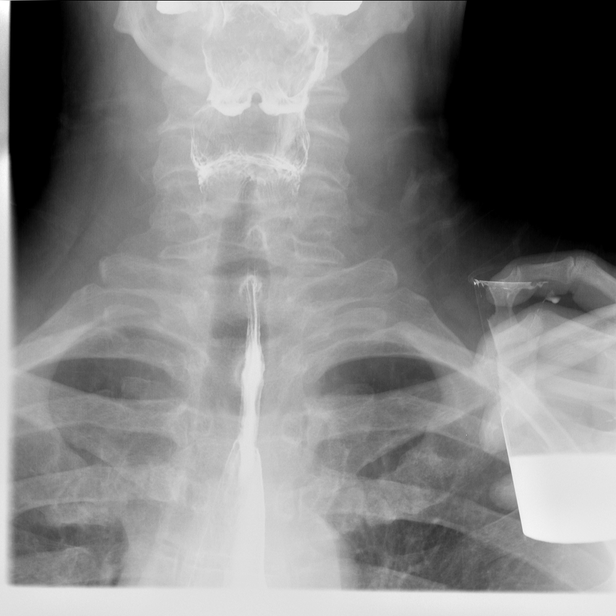
[frame 12/14]
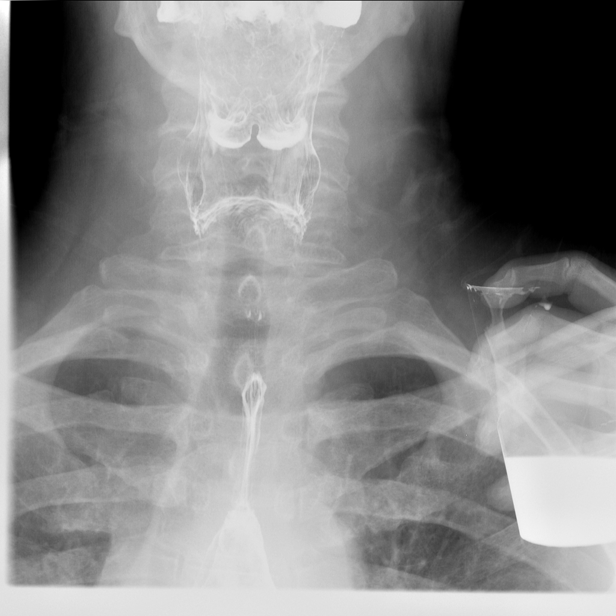

[Series 2: fluoro_barium 2fps_bw · 0.17mm/px · 3 of 13 frames shown (2 of 9)]
[frame 2/13]
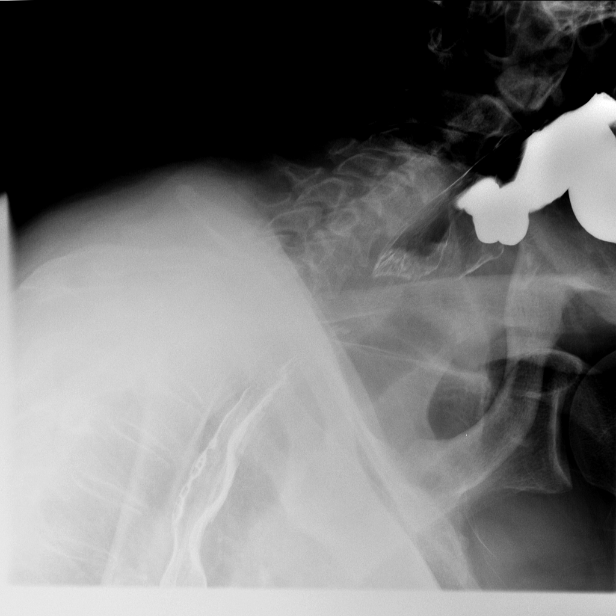
[frame 7/13]
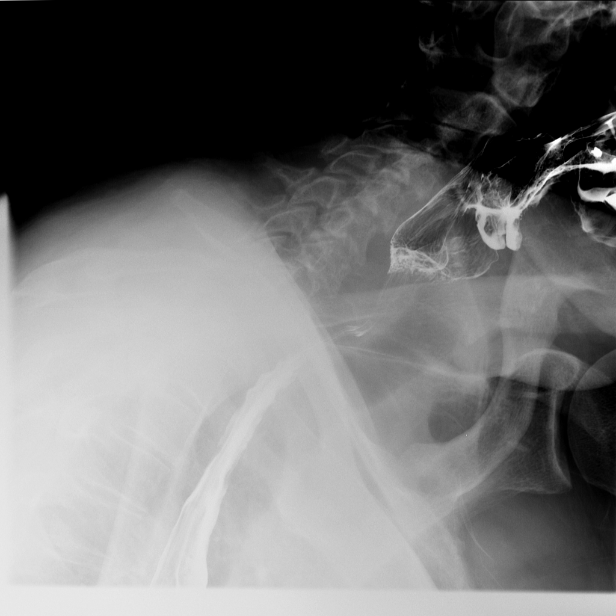
[frame 12/13]
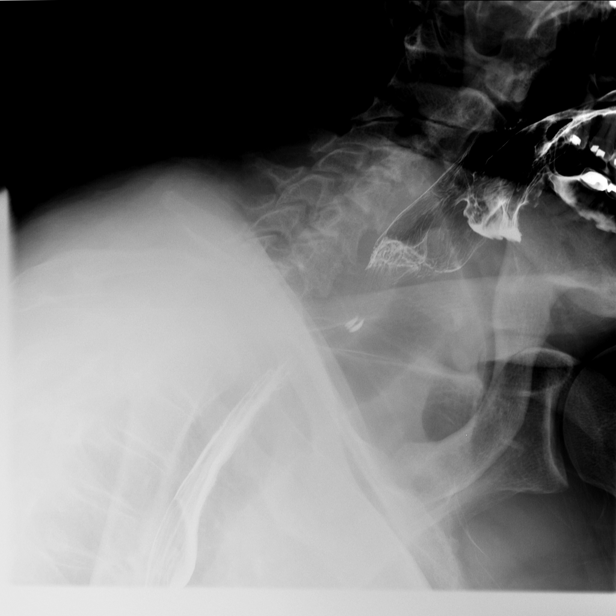

[Series 4: fluoro_barium 2fps_bw · 0.18mm/px · 1 of 1 slices shown (3 of 9)]
[im 1/1]
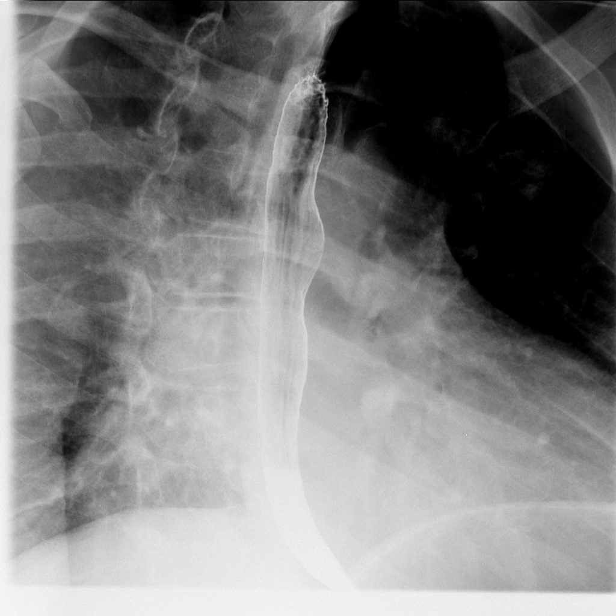

[Series 5: fluoro_barium 2fps_bw · 0.18mm/px · 2 of 2 frames shown (4 of 9)]
[frame 1/2]
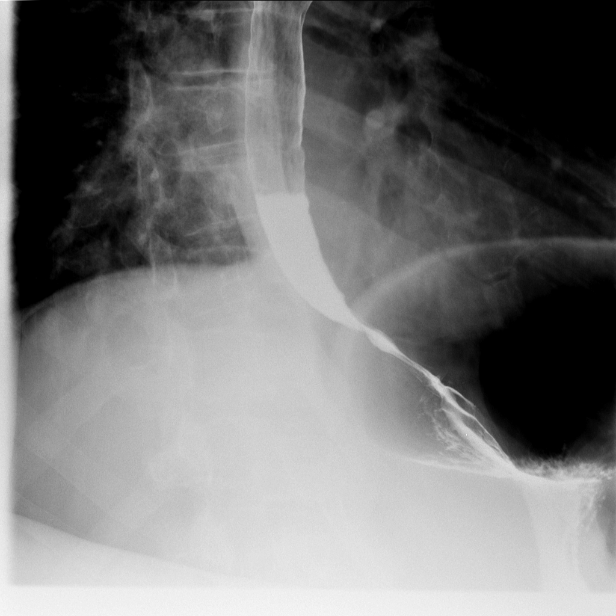
[frame 2/2]
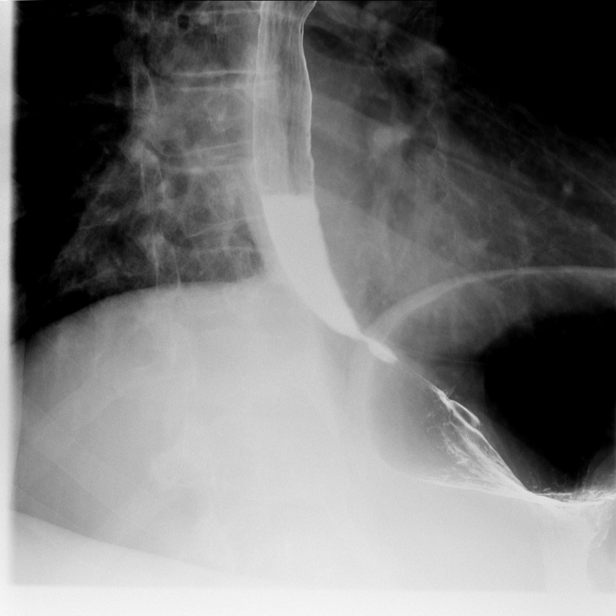

[Series 7: fluoro_barium 2fps_bw · 0.17mm/px · 1 of 1 slices shown (5 of 9)]
[im 1/1]
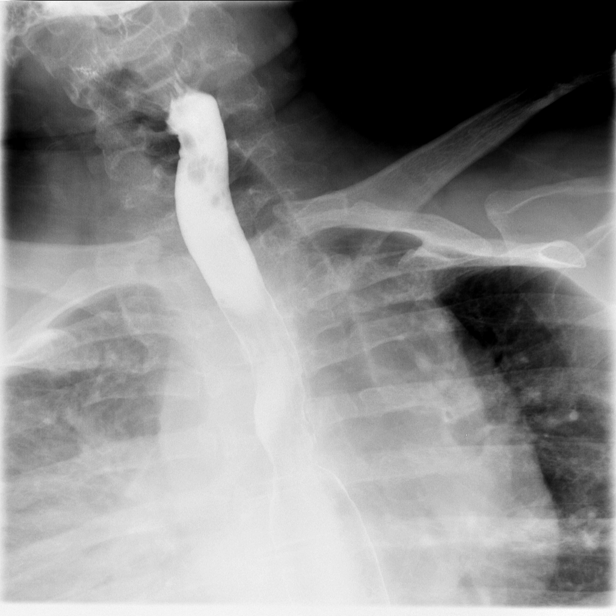

[Series 8: fluoro_barium 2fps_bw · 0.18mm/px · 1 of 1 slices shown (6 of 9)]
[im 1/1]
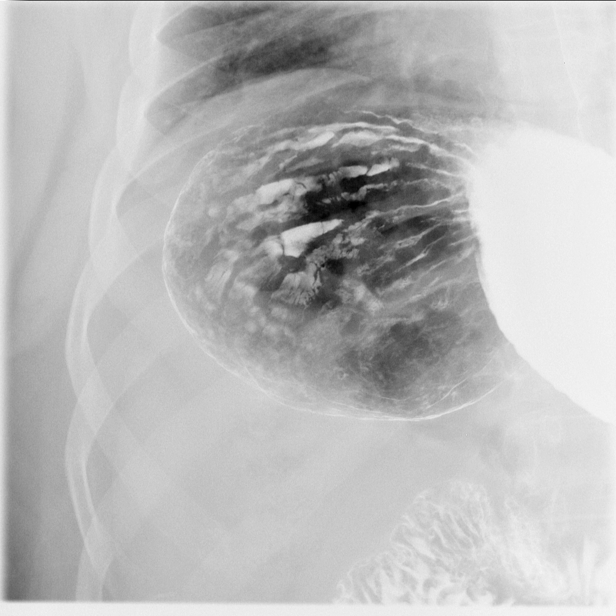

[Series 10: fluoro_barium 2fps_bw · 0.18mm/px · 2 of 2 frames shown (7 of 9)]
[frame 1/2]
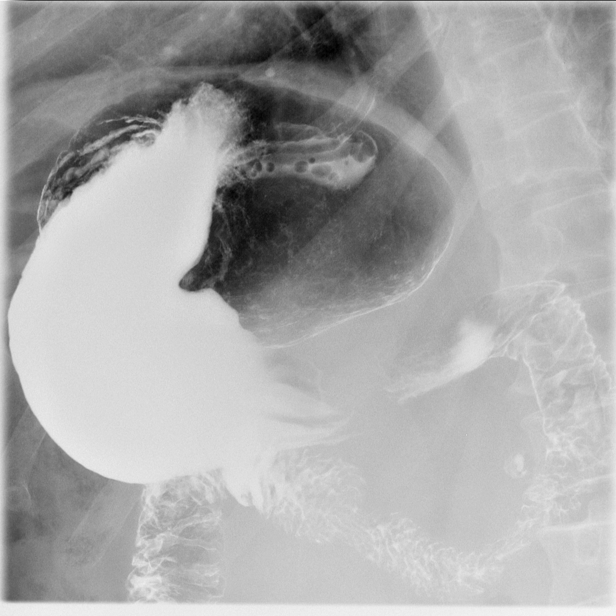
[frame 2/2]
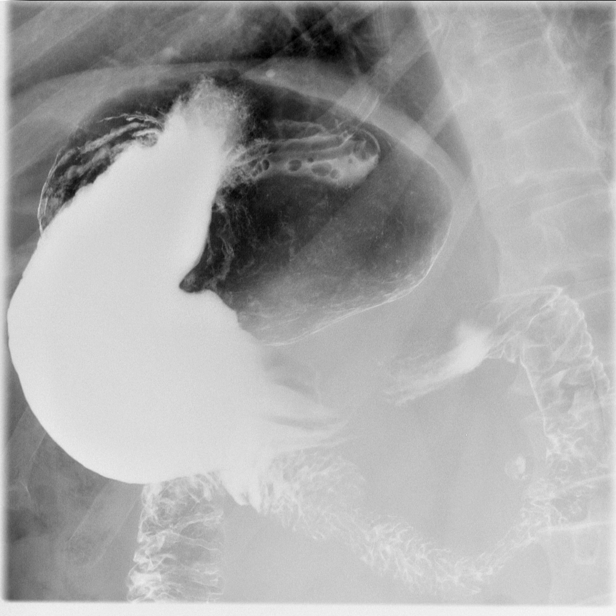

[Series 11: fluoro_barium 2fps_bw · 0.19mm/px · 1 of 1 slices shown (8 of 9)]
[im 1/1]
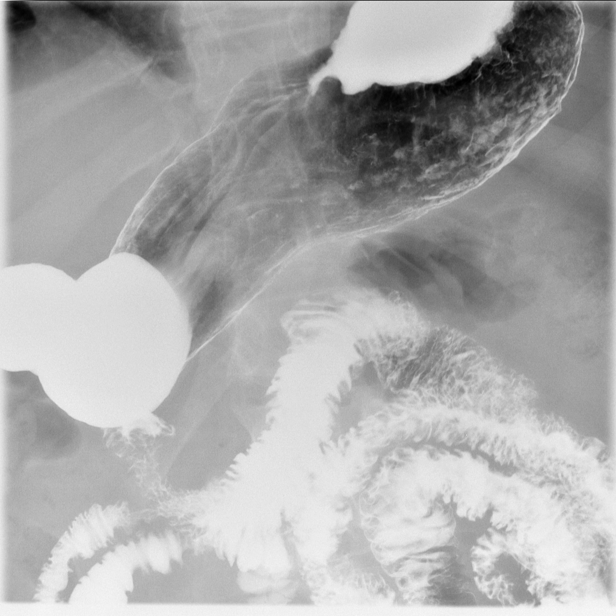

[Series 13: fluoro_barium 2fps_bw · 0.19mm/px · 1 of 1 slices shown (9 of 9)]
[im 1/1]
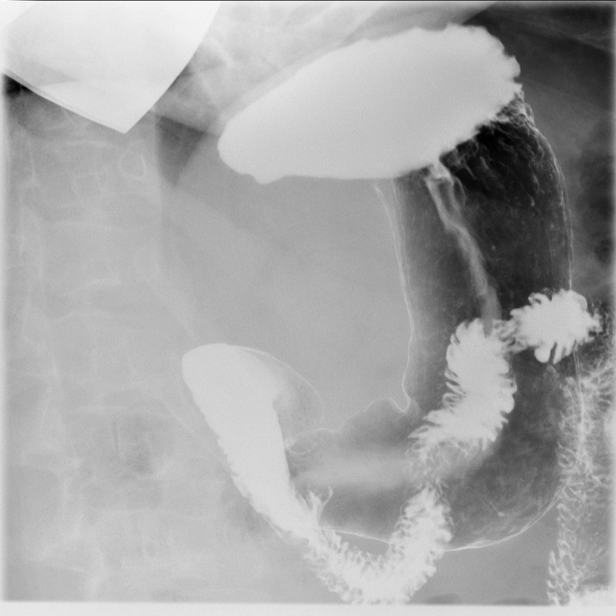

[15 of 21 positions shown; findings below may reference images not displayed]

PROCEDURE:     FL  - FL UPPER GI W/ BARIUM SWALLOW  - August 24, 2012  [DATE]

RESULT:     Standard barium swallow upper GI performed. Cervical and
thoracic esophagus reveal no focal abnormality. The stomach and duodenum are
normal. No evidence of reflux or peptic ulcer disease. Slightly limited exam
due to the patient's condition.
IMPRESSION: Negative exam.

## 2014-02-20 DIAGNOSIS — B351 Tinea unguium: Secondary | ICD-10-CM | POA: Diagnosis not present

## 2014-02-20 DIAGNOSIS — M79609 Pain in unspecified limb: Secondary | ICD-10-CM | POA: Diagnosis not present

## 2014-02-28 DIAGNOSIS — J309 Allergic rhinitis, unspecified: Secondary | ICD-10-CM | POA: Diagnosis not present

## 2014-03-08 DIAGNOSIS — Z125 Encounter for screening for malignant neoplasm of prostate: Secondary | ICD-10-CM | POA: Diagnosis not present

## 2014-03-08 DIAGNOSIS — E782 Mixed hyperlipidemia: Secondary | ICD-10-CM | POA: Diagnosis not present

## 2014-03-08 DIAGNOSIS — I10 Essential (primary) hypertension: Secondary | ICD-10-CM | POA: Diagnosis not present

## 2014-03-08 DIAGNOSIS — Z0000 Encounter for general adult medical examination without abnormal findings: Secondary | ICD-10-CM | POA: Diagnosis not present

## 2014-03-08 DIAGNOSIS — E559 Vitamin D deficiency, unspecified: Secondary | ICD-10-CM | POA: Diagnosis not present

## 2014-03-14 DIAGNOSIS — J309 Allergic rhinitis, unspecified: Secondary | ICD-10-CM | POA: Diagnosis not present

## 2014-03-28 DIAGNOSIS — J309 Allergic rhinitis, unspecified: Secondary | ICD-10-CM | POA: Diagnosis not present

## 2014-03-31 DIAGNOSIS — S80812A Abrasion, left lower leg, initial encounter: Secondary | ICD-10-CM | POA: Diagnosis not present

## 2014-03-31 DIAGNOSIS — S8012XA Contusion of left lower leg, initial encounter: Secondary | ICD-10-CM | POA: Diagnosis not present

## 2014-04-11 DIAGNOSIS — J3081 Allergic rhinitis due to animal (cat) (dog) hair and dander: Secondary | ICD-10-CM | POA: Diagnosis not present

## 2014-04-11 DIAGNOSIS — J301 Allergic rhinitis due to pollen: Secondary | ICD-10-CM | POA: Diagnosis not present

## 2014-04-23 DIAGNOSIS — J301 Allergic rhinitis due to pollen: Secondary | ICD-10-CM | POA: Diagnosis not present

## 2014-04-23 DIAGNOSIS — J3081 Allergic rhinitis due to animal (cat) (dog) hair and dander: Secondary | ICD-10-CM | POA: Diagnosis not present

## 2014-04-24 DIAGNOSIS — J3081 Allergic rhinitis due to animal (cat) (dog) hair and dander: Secondary | ICD-10-CM | POA: Diagnosis not present

## 2014-04-24 DIAGNOSIS — J301 Allergic rhinitis due to pollen: Secondary | ICD-10-CM | POA: Diagnosis not present

## 2014-04-25 DIAGNOSIS — F329 Major depressive disorder, single episode, unspecified: Secondary | ICD-10-CM | POA: Diagnosis not present

## 2014-04-25 DIAGNOSIS — Z79899 Other long term (current) drug therapy: Secondary | ICD-10-CM | POA: Diagnosis not present

## 2014-04-26 IMAGING — CR DG HIP COMPLETE 2+V*L*
1 series · 2 of 2 positions shown · non-contrast
Comparison: none

REASON FOR EXAM: pain
COMMENTS:

PROCEDURE:     KDR - KDXR HIP LEFT COMPLETE  - November 03, 2012  [DATE]
RESULT:     AP and lateral views of the left hip demonstrate no fracture,
dislocation or significant degenerative change.

[Series 1: ap · 0.17mm/px · 2 of 2 slices shown]
[im 1/2]
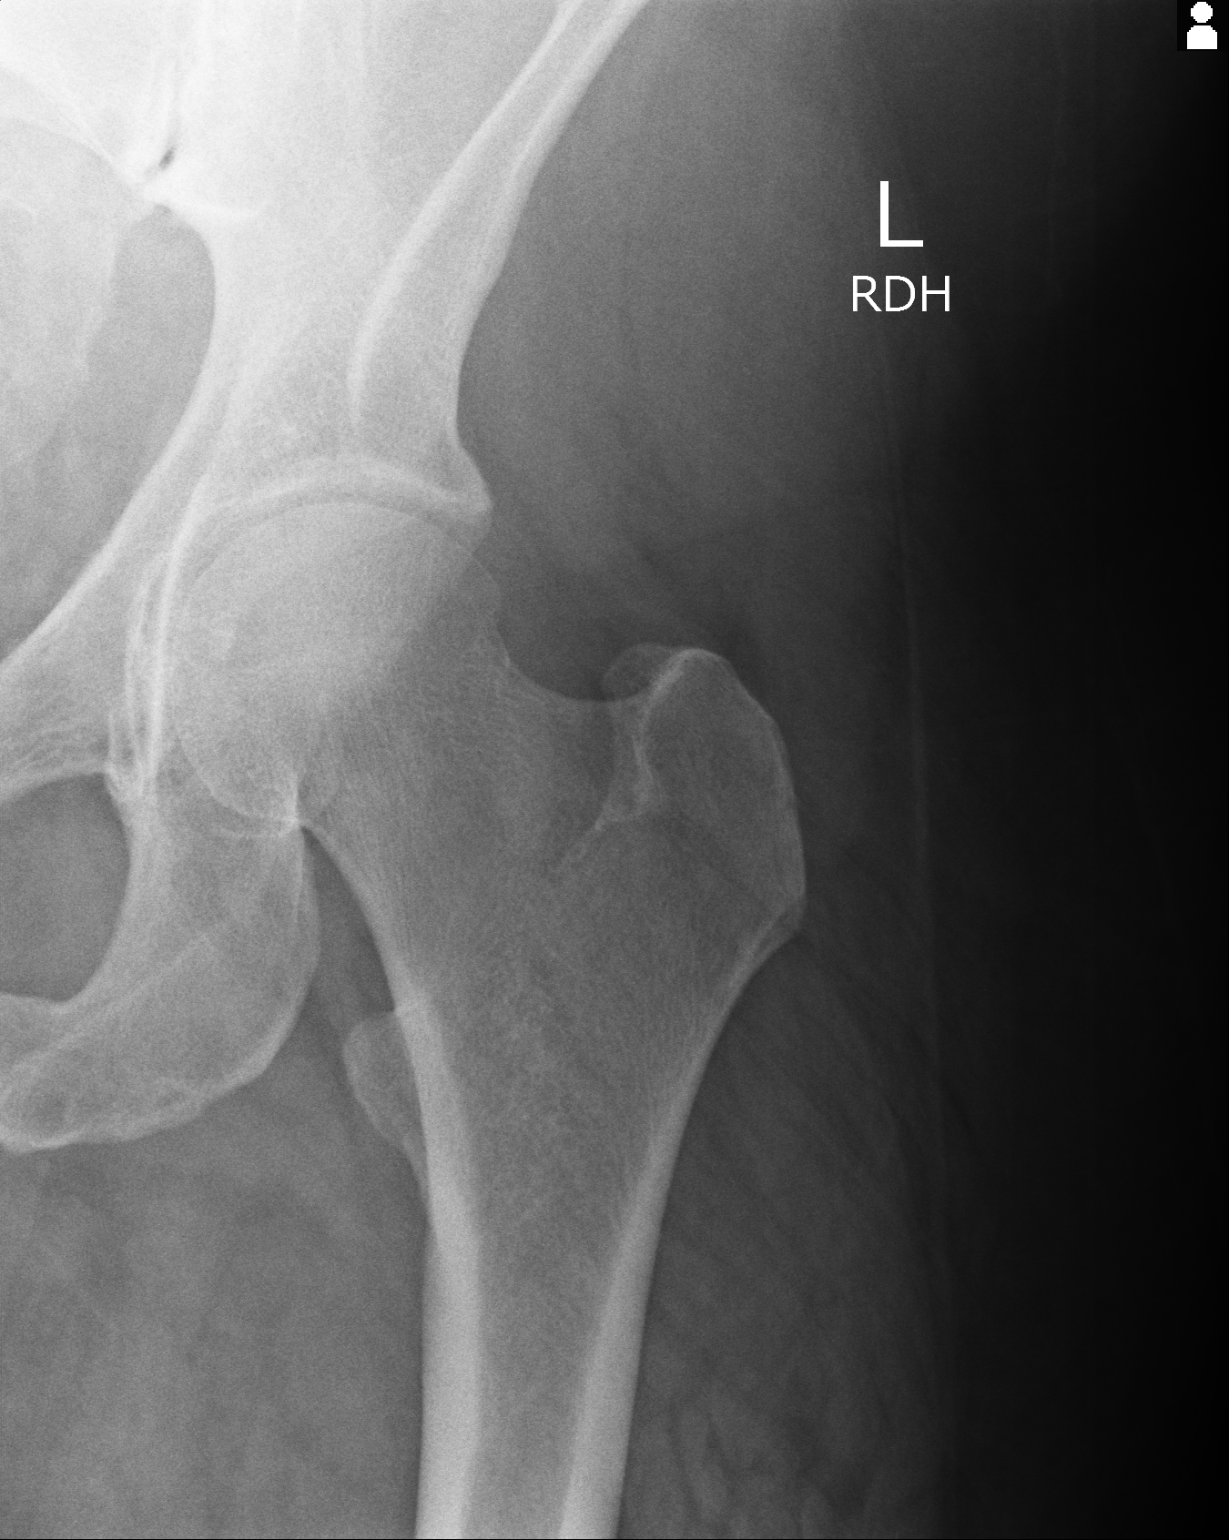
[im 2/2]
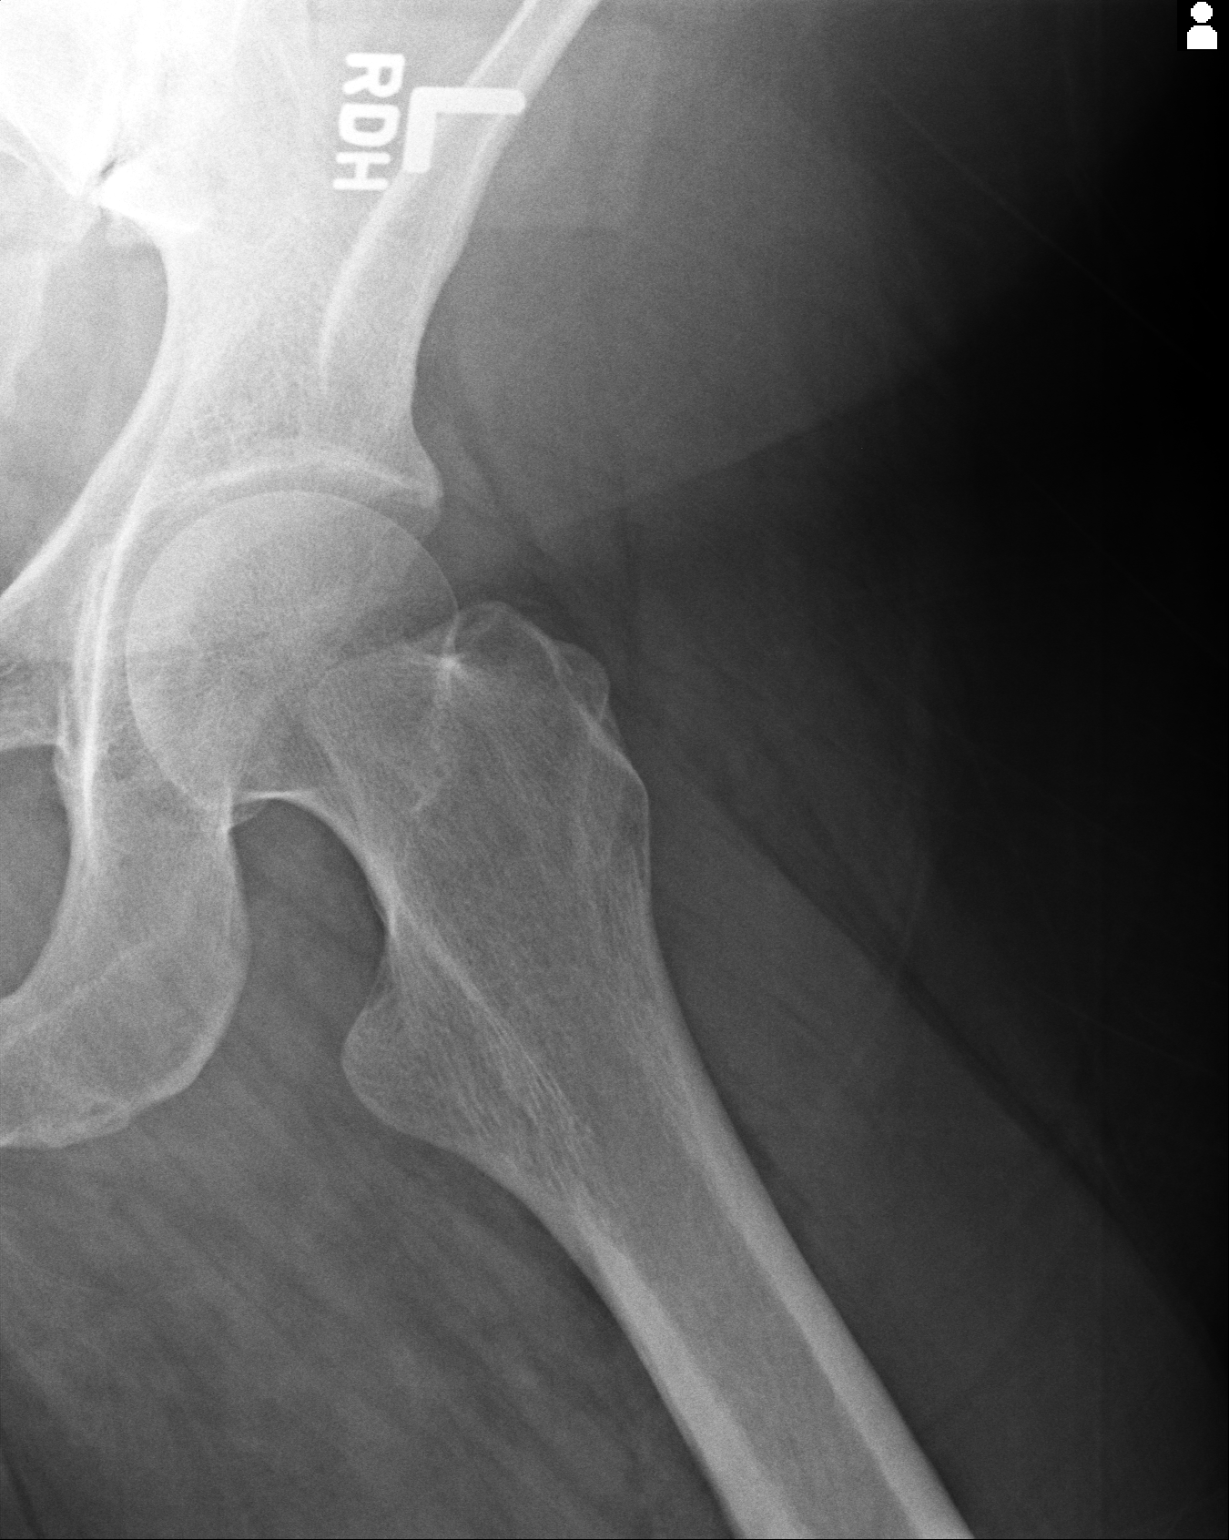

[2 of 2 positions shown; findings below may reference images not displayed]

IMPRESSION: No acute bony abnormality of the left hip.

[REDACTED]

## 2014-05-03 DIAGNOSIS — J3089 Other allergic rhinitis: Secondary | ICD-10-CM | POA: Diagnosis not present

## 2014-05-03 DIAGNOSIS — Z9889 Other specified postprocedural states: Secondary | ICD-10-CM | POA: Diagnosis not present

## 2014-05-03 DIAGNOSIS — E049 Nontoxic goiter, unspecified: Secondary | ICD-10-CM | POA: Diagnosis not present

## 2014-05-03 DIAGNOSIS — J324 Chronic pansinusitis: Secondary | ICD-10-CM | POA: Diagnosis not present

## 2014-05-09 DIAGNOSIS — J301 Allergic rhinitis due to pollen: Secondary | ICD-10-CM | POA: Diagnosis not present

## 2014-05-09 DIAGNOSIS — J3081 Allergic rhinitis due to animal (cat) (dog) hair and dander: Secondary | ICD-10-CM | POA: Diagnosis not present

## 2014-05-22 DIAGNOSIS — J301 Allergic rhinitis due to pollen: Secondary | ICD-10-CM | POA: Diagnosis not present

## 2014-05-22 DIAGNOSIS — J3081 Allergic rhinitis due to animal (cat) (dog) hair and dander: Secondary | ICD-10-CM | POA: Diagnosis not present

## 2014-05-28 DIAGNOSIS — M79672 Pain in left foot: Secondary | ICD-10-CM | POA: Diagnosis not present

## 2014-05-28 DIAGNOSIS — M79671 Pain in right foot: Secondary | ICD-10-CM | POA: Diagnosis not present

## 2014-05-28 DIAGNOSIS — B351 Tinea unguium: Secondary | ICD-10-CM | POA: Diagnosis not present

## 2014-06-06 DIAGNOSIS — F319 Bipolar disorder, unspecified: Secondary | ICD-10-CM | POA: Diagnosis not present

## 2014-06-06 DIAGNOSIS — J3081 Allergic rhinitis due to animal (cat) (dog) hair and dander: Secondary | ICD-10-CM | POA: Diagnosis not present

## 2014-06-06 DIAGNOSIS — Z6841 Body Mass Index (BMI) 40.0 and over, adult: Secondary | ICD-10-CM | POA: Diagnosis not present

## 2014-06-06 DIAGNOSIS — E782 Mixed hyperlipidemia: Secondary | ICD-10-CM | POA: Diagnosis not present

## 2014-06-06 DIAGNOSIS — E559 Vitamin D deficiency, unspecified: Secondary | ICD-10-CM | POA: Diagnosis not present

## 2014-06-06 DIAGNOSIS — J301 Allergic rhinitis due to pollen: Secondary | ICD-10-CM | POA: Diagnosis not present

## 2014-06-06 DIAGNOSIS — G4733 Obstructive sleep apnea (adult) (pediatric): Secondary | ICD-10-CM | POA: Diagnosis not present

## 2014-06-19 DIAGNOSIS — J3081 Allergic rhinitis due to animal (cat) (dog) hair and dander: Secondary | ICD-10-CM | POA: Diagnosis not present

## 2014-06-19 DIAGNOSIS — J301 Allergic rhinitis due to pollen: Secondary | ICD-10-CM | POA: Diagnosis not present

## 2014-07-04 DIAGNOSIS — G4733 Obstructive sleep apnea (adult) (pediatric): Secondary | ICD-10-CM | POA: Diagnosis not present

## 2014-07-04 DIAGNOSIS — G2581 Restless legs syndrome: Secondary | ICD-10-CM | POA: Diagnosis not present

## 2014-07-04 DIAGNOSIS — Z6841 Body Mass Index (BMI) 40.0 and over, adult: Secondary | ICD-10-CM | POA: Diagnosis not present

## 2014-07-04 DIAGNOSIS — J301 Allergic rhinitis due to pollen: Secondary | ICD-10-CM | POA: Diagnosis not present

## 2014-07-18 DIAGNOSIS — J301 Allergic rhinitis due to pollen: Secondary | ICD-10-CM | POA: Diagnosis not present

## 2014-07-24 DIAGNOSIS — G4733 Obstructive sleep apnea (adult) (pediatric): Secondary | ICD-10-CM | POA: Diagnosis not present

## 2014-07-26 DIAGNOSIS — F329 Major depressive disorder, single episode, unspecified: Secondary | ICD-10-CM | POA: Diagnosis not present

## 2014-08-07 DIAGNOSIS — J301 Allergic rhinitis due to pollen: Secondary | ICD-10-CM | POA: Diagnosis not present

## 2014-08-22 DIAGNOSIS — J301 Allergic rhinitis due to pollen: Secondary | ICD-10-CM | POA: Diagnosis not present

## 2014-08-23 DIAGNOSIS — J301 Allergic rhinitis due to pollen: Secondary | ICD-10-CM | POA: Diagnosis not present

## 2014-08-28 DIAGNOSIS — M79671 Pain in right foot: Secondary | ICD-10-CM | POA: Diagnosis not present

## 2014-08-28 DIAGNOSIS — B351 Tinea unguium: Secondary | ICD-10-CM | POA: Diagnosis not present

## 2014-08-28 DIAGNOSIS — M79672 Pain in left foot: Secondary | ICD-10-CM | POA: Diagnosis not present

## 2014-09-05 DIAGNOSIS — J301 Allergic rhinitis due to pollen: Secondary | ICD-10-CM | POA: Diagnosis not present

## 2014-09-19 DIAGNOSIS — J301 Allergic rhinitis due to pollen: Secondary | ICD-10-CM | POA: Diagnosis not present

## 2014-10-03 DIAGNOSIS — J301 Allergic rhinitis due to pollen: Secondary | ICD-10-CM | POA: Diagnosis not present

## 2014-10-08 DIAGNOSIS — F319 Bipolar disorder, unspecified: Secondary | ICD-10-CM | POA: Diagnosis not present

## 2014-10-09 IMAGING — US THYROID ULTRASOUND
1 series · 14 of 25 positions shown · non-contrast
Comparison: none

REASON FOR EXAM: 6 Mo Follow Up Thyroid Nodule
COMMENTS:

[Series 1: thyroid ultrasound · 0.08mm/px · 14 of 59 slices shown]
[im 1/59]
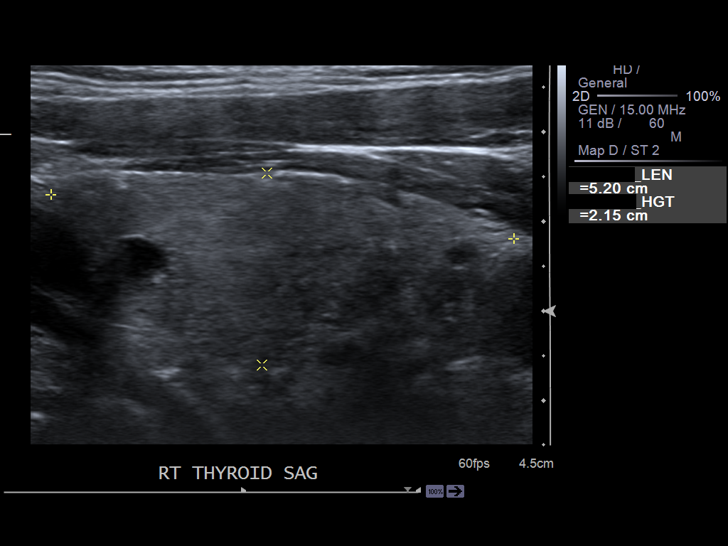
[im 5/59]
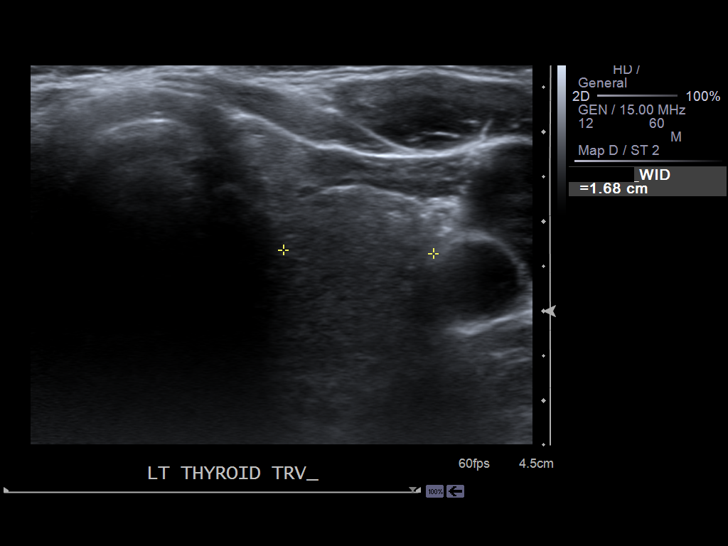
[im 10/59]
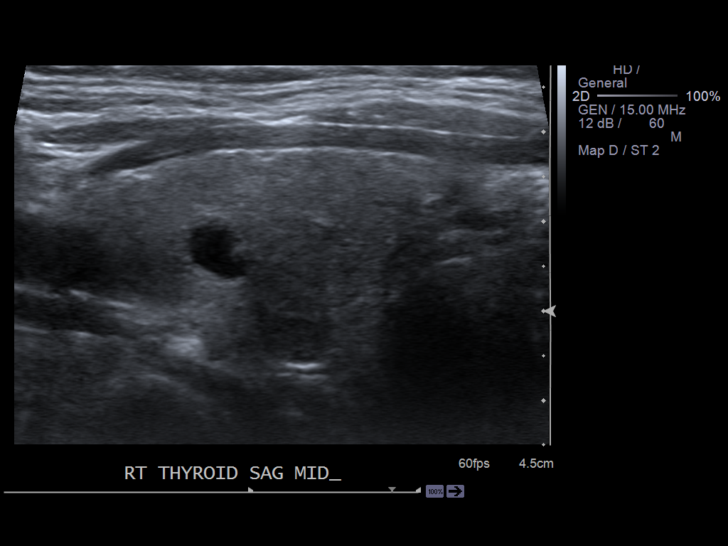
[im 15/59]
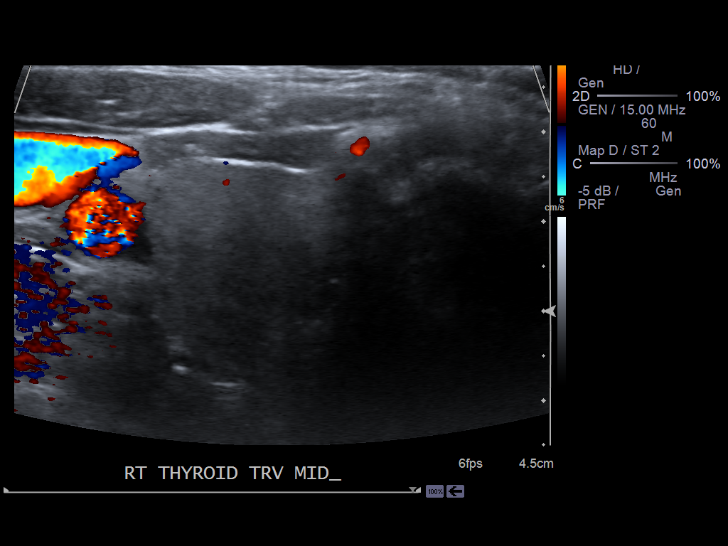
[im 20/59]
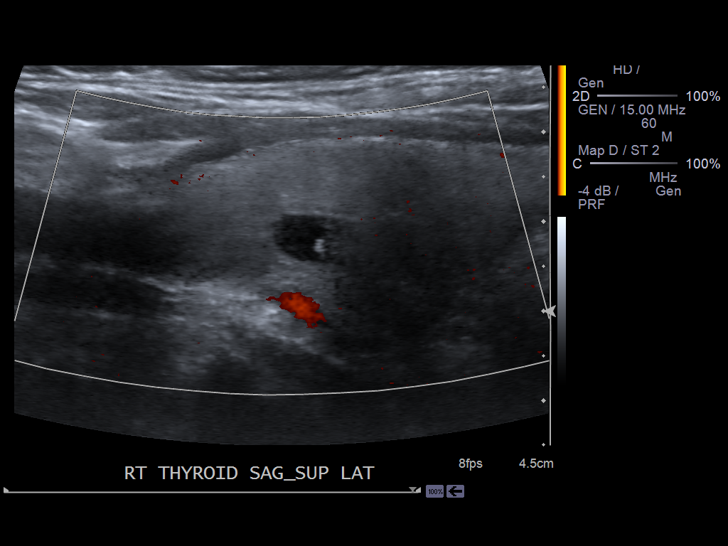
[im 22/59]
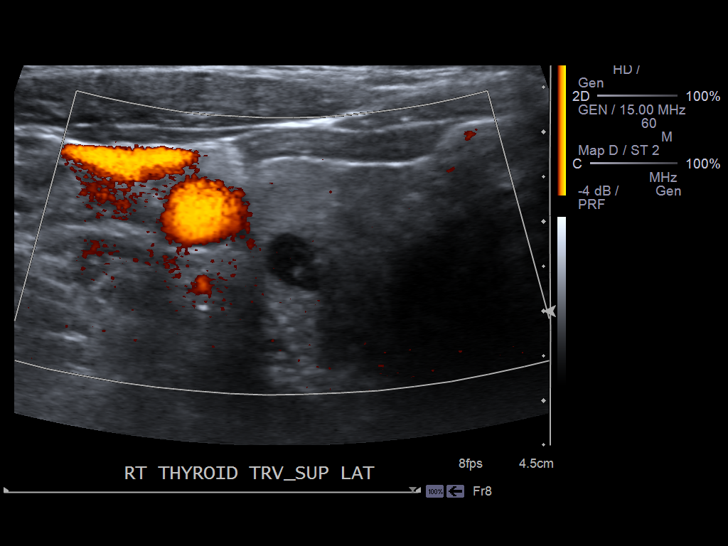
[im 27/59]
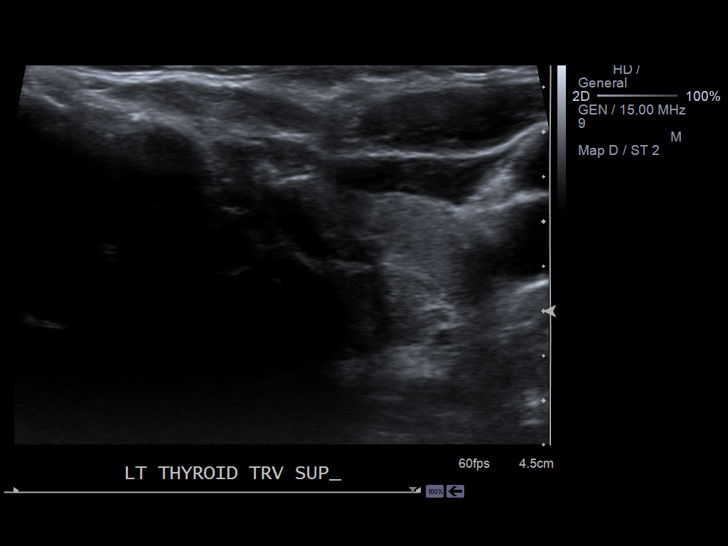
[im 32/59]
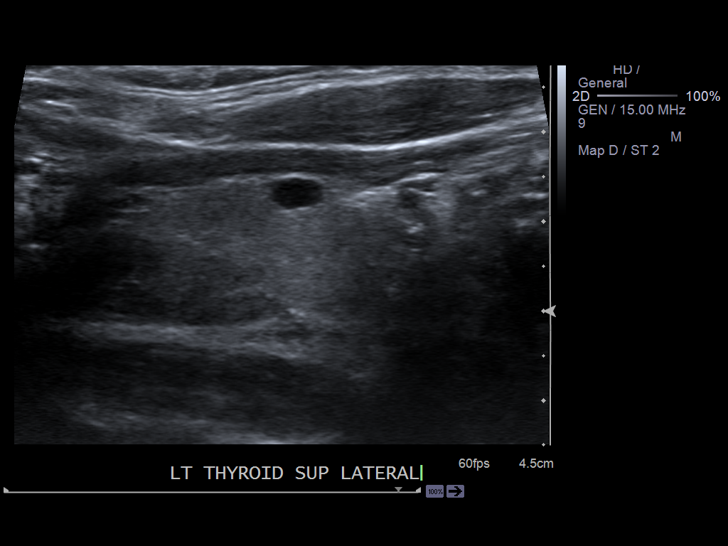
[im 37/59]
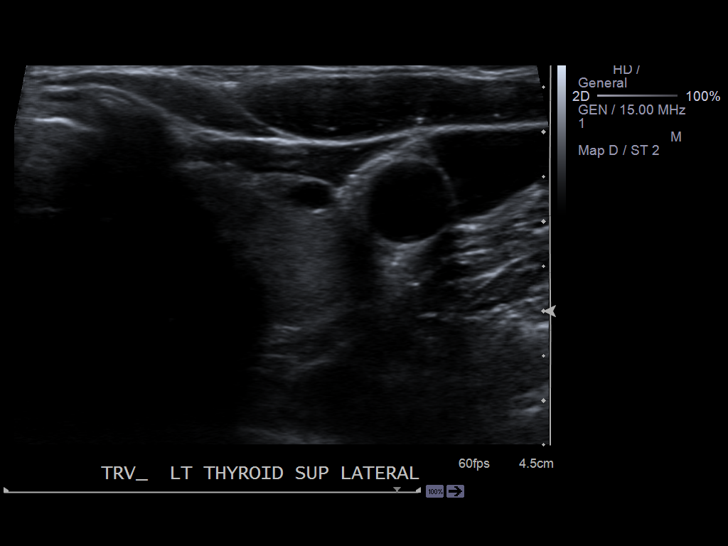
[im 39/59]
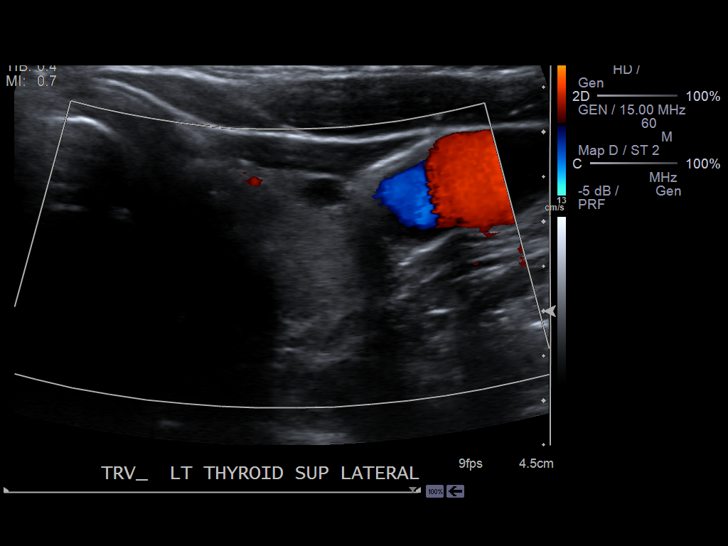
[im 44/59]
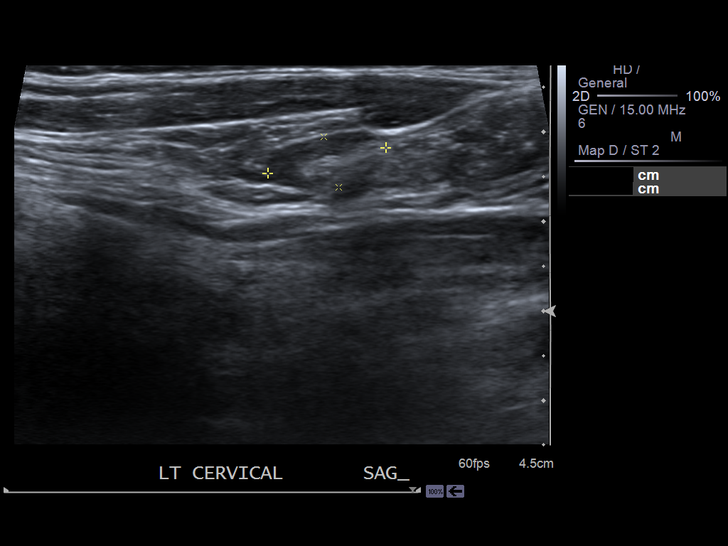
[im 49/59]
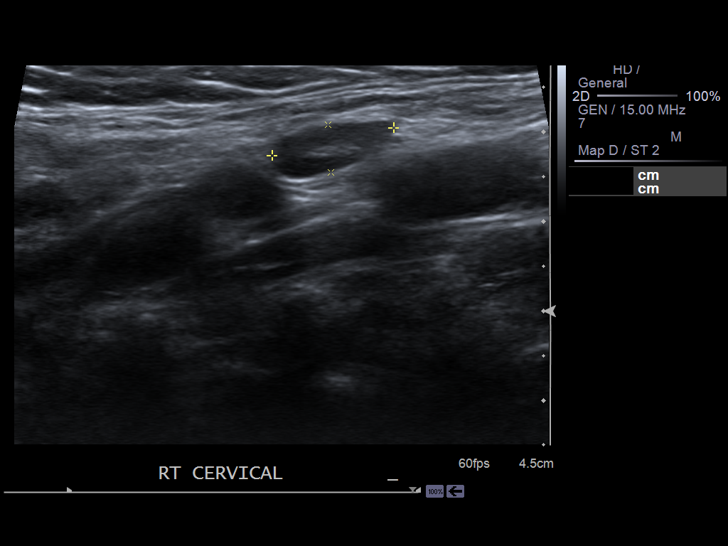
[im 54/59]
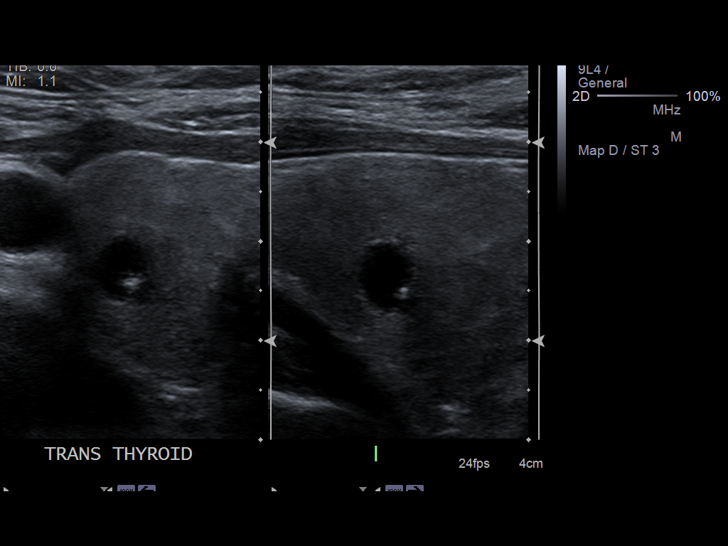
[im 59/59]
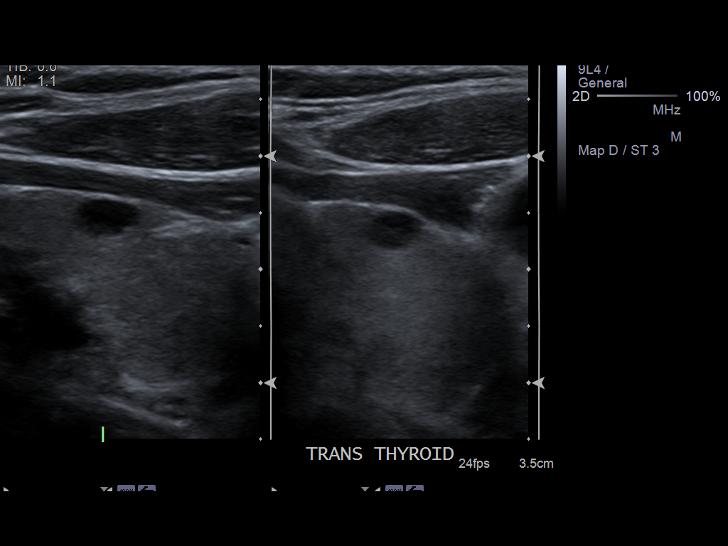

[14 of 25 positions shown; findings below may reference images not displayed]

PROCEDURE:     US  - US SOFT TISSUE HEAD/NECK/THYROID  - April 18, 2013 [DATE]

RESULT:     Grayscale and color flow Doppler techniques were employed to
evaluate the thyroid gland. No previous studies are available for comparison.

The right thyroid lobe measures 4.2 x 2.1 x 2.4 cm. In the mid to upper pole
laterally there is a 0.6 x 0.6 x 0.5 cm diameter hypoechoic nodule with
internal echoes which may reflect a colloid cyst.

The left thyroid lobe measures 3.9 x 1.6 x 1.8 cm. A cystic-appearing
structure in the upper pole is demonstrated. It measures 0.6 x 0.3 x 0.5 cm.
The thyroid isthmus appears normal measuring 0.4 cm in thickness.
IMPRESSION: 1. In the right thyroid lobe in the mid upper pole there is a hypoechoic
nodule with internal echoes which measures 6 mm in greatest dimension.
2. In the left thyroid lobe superiorly there is a cystic-appearing nodule
which measures 6 mm in greatest dimension.

[REDACTED]

## 2014-10-17 DIAGNOSIS — R635 Abnormal weight gain: Secondary | ICD-10-CM | POA: Diagnosis not present

## 2014-10-17 DIAGNOSIS — I10 Essential (primary) hypertension: Secondary | ICD-10-CM | POA: Diagnosis not present

## 2014-10-17 DIAGNOSIS — J3081 Allergic rhinitis due to animal (cat) (dog) hair and dander: Secondary | ICD-10-CM | POA: Diagnosis not present

## 2014-10-17 DIAGNOSIS — J301 Allergic rhinitis due to pollen: Secondary | ICD-10-CM | POA: Diagnosis not present

## 2014-10-17 DIAGNOSIS — K219 Gastro-esophageal reflux disease without esophagitis: Secondary | ICD-10-CM | POA: Diagnosis not present

## 2014-10-17 DIAGNOSIS — Z6841 Body Mass Index (BMI) 40.0 and over, adult: Secondary | ICD-10-CM | POA: Diagnosis not present

## 2014-10-18 NOTE — Op Note (Signed)
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   PROCEDURES: 1.  Internal nasal valve stenosis repair.  2.  Septoplasty.  3.  Harvest of conchal cartilage from the left ear.  4.  Bilateral submucous resection the inferior turbinates.   ANESTHESIA:  General endotracheal.  DESCRIPTION OF PROCEDURE:  The patient was placed in the supine position on the operating room table.  After general endotracheal anesthesia had been induced, the patient was turned 90 degrees counterclockwise from anesthesia and placed in a beach chair position.  The left ear was locally anesthetized with 0.5% Lidocaine, 0.25% Bupivacaine mixed 1:150,000 with epinephrine.  The same local was used to perform infraorbital nerve blocks and to inject the hemitransfixion incisions in the septum, the intercartilaginous incisions and subnasal SMAS plane over the nasal dorsum.  Phenylephrine/lidocaine soaked pledgets, two on each side, were placed intranasally.  The left ear and nose were prepped and draped in the usual fashion.  Additional local was placed in the inferior turbinates after removal of the phenylephrine/lidocaine soaked pledgets.    A 15 blade was used to make an incision in the antihelical fold and an anteriorly-based skin-perichondrial flap was elevated.  A concha cymba and concha caval graft was harvested in the usual fashion preserving the posterior perichondrium.  Meticulous hemostasis was achieved in the wound and the incision was closed with a running interlocking 5-0 fast absorbing gut suture.  A Telfa sandwich type dressing was  placed and secured with a through-and-through 3-0 nylon suture.    Attention was directed to the nose.  A left-sided hemitransfixion incision was made and subperichondrial, subperiosteal pockets were elevated.  The deformed portions of cartilage and bone were resected sequentially, enough to allow the septum to move back into the midline.  The septal leaflets were reapproximated with quilting-type 4-0 chromic suture.  A 4-0 plain gut on a mini-Keith needle was used to close the left hemitransfixion incision in an interrupted fashion.    The intercartilaginous incisions were then made with a 15 blade a subnasal SMAS plane was elevated over the nasal dorsum. The upper lateral cartilages were then disarticulated from the dorsal septum and the conchal cartilage graft was carved to and sutured at the two points medially and laterally at the caudal margin of the upper lateral cartilage and the caudal margin of the graft. The intercartilaginous incisions were then closed with 5-0 chromic gut. Surgiflo and Telfa pledgets were placed. The nose was then stabilized with Mastisol and 1/2-inch paper tape.   Attention was directed to the inferior turbinates.  A 15 blade was used to make an incision at the head of the inferior turbinate on the right and extended along the inferior margin.  Medial mucoperiosteum was then elevated with a Cottle elevator and lateral mucoperiosteum as well as conchal bone were resected with a Optometrist.  The inferior turbinate remnant was then lateralized with a Bouie elevator and the inferior margin was cauterized with suction cautery.  An identical procedure was performed on the left inferior turbinate.  Surgiflo was placed bilaterally.    The patient was returned to anesthesia, allowed to emerge from anesthesia in the operating room, and  taken to the recovery room in stable condition. There were no complications. Estimated blood loss 20 mL.       ____________________________ J.  Nadeen Landau, MD jmc:cc D: 12/07/2012 21:59:49 ET T: 12/07/2012 22:47:01 ET JOB#: [id number]  cc: Janalee Dane, MD, <Dictator> Nicholos Johns MD ELECTRONICALLY SIGNED 12/20/2012 7:49

## 2014-11-05 DIAGNOSIS — F319 Bipolar disorder, unspecified: Secondary | ICD-10-CM | POA: Diagnosis not present

## 2014-11-07 DIAGNOSIS — J301 Allergic rhinitis due to pollen: Secondary | ICD-10-CM | POA: Diagnosis not present

## 2014-11-18 DIAGNOSIS — Z6839 Body mass index (BMI) 39.0-39.9, adult: Secondary | ICD-10-CM | POA: Diagnosis not present

## 2014-11-18 DIAGNOSIS — R635 Abnormal weight gain: Secondary | ICD-10-CM | POA: Diagnosis not present

## 2014-11-18 DIAGNOSIS — I10 Essential (primary) hypertension: Secondary | ICD-10-CM | POA: Diagnosis not present

## 2014-11-18 DIAGNOSIS — J301 Allergic rhinitis due to pollen: Secondary | ICD-10-CM | POA: Diagnosis not present

## 2014-12-03 DIAGNOSIS — M79672 Pain in left foot: Secondary | ICD-10-CM | POA: Diagnosis not present

## 2014-12-03 DIAGNOSIS — B351 Tinea unguium: Secondary | ICD-10-CM | POA: Diagnosis not present

## 2014-12-03 DIAGNOSIS — M79671 Pain in right foot: Secondary | ICD-10-CM | POA: Diagnosis not present

## 2014-12-05 DIAGNOSIS — J301 Allergic rhinitis due to pollen: Secondary | ICD-10-CM | POA: Diagnosis not present

## 2014-12-07 ENCOUNTER — Emergency Department
Admission: EM | Admit: 2014-12-07 | Discharge: 2014-12-07 | Disposition: A | Discharge status: Home / Self Care | Payer: Medicare Other | Attending: Emergency Medicine | Admitting: Emergency Medicine

## 2014-12-07 ENCOUNTER — Other Ambulatory Visit: Payer: Self-pay

## 2014-12-07 ENCOUNTER — Encounter: Payer: Self-pay | Admitting: Emergency Medicine

## 2014-12-07 DIAGNOSIS — R42 Dizziness and giddiness: Secondary | ICD-10-CM | POA: Diagnosis not present

## 2014-12-07 DIAGNOSIS — Z91040 Latex allergy status: Secondary | ICD-10-CM | POA: Insufficient documentation

## 2014-12-07 DIAGNOSIS — R531 Weakness: Secondary | ICD-10-CM | POA: Diagnosis not present

## 2014-12-07 HISTORY — DX: Unspecified intellectual disabilities: F79

## 2014-12-07 HISTORY — DX: Gastro-esophageal reflux disease without esophagitis: K21.9

## 2014-12-07 LAB — URINALYSIS COMPLETE WITH MICROSCOPIC (ARMC ONLY)
BACTERIA UA: NONE SEEN
BILIRUBIN URINE: NEGATIVE
Glucose, UA: NEGATIVE mg/dL
Hgb urine dipstick: NEGATIVE
Ketones, ur: NEGATIVE mg/dL
Leukocytes, UA: NEGATIVE
Nitrite: NEGATIVE
PROTEIN: NEGATIVE mg/dL
Specific Gravity, Urine: 1.018 (ref 1.005–1.030)
pH: 7 (ref 5.0–8.0)

## 2014-12-07 LAB — COMPREHENSIVE METABOLIC PANEL
ALBUMIN: 4.3 g/dL (ref 3.5–5.0)
ALT: 23 U/L (ref 17–63)
AST: 24 U/L (ref 15–41)
Alkaline Phosphatase: 45 U/L (ref 38–126)
Anion gap: 9 (ref 5–15)
BILIRUBIN TOTAL: 0.5 mg/dL (ref 0.3–1.2)
BUN: 16 mg/dL (ref 6–20)
CALCIUM: 9.2 mg/dL (ref 8.9–10.3)
CHLORIDE: 107 mmol/L (ref 101–111)
CO2: 25 mmol/L (ref 22–32)
CREATININE: 1.02 mg/dL (ref 0.61–1.24)
Glucose, Bld: 101 mg/dL — ABNORMAL HIGH (ref 65–99)
POTASSIUM: 3.6 mmol/L (ref 3.5–5.1)
Sodium: 141 mmol/L (ref 135–145)
TOTAL PROTEIN: 7.1 g/dL (ref 6.5–8.1)

## 2014-12-07 LAB — CBC
HEMATOCRIT: 46 % (ref 40.0–52.0)
HEMOGLOBIN: 14.8 g/dL (ref 13.0–18.0)
MCH: 28.1 pg (ref 26.0–34.0)
MCHC: 32.2 g/dL (ref 32.0–36.0)
MCV: 87.1 fL (ref 80.0–100.0)
Platelets: 240 10*3/uL (ref 150–440)
RBC: 5.28 MIL/uL (ref 4.40–5.90)
RDW: 14.1 % (ref 11.5–14.5)
WBC: 6.6 10*3/uL (ref 3.8–10.6)

## 2014-12-07 LAB — TROPONIN I: Troponin I: <0.03 ng/mL (ref <0.031)

## 2014-12-07 NOTE — ED Notes (Signed)
States felt dizzy at home, lives with mom, states felt nauseated at time, seen by EMS, has normal VS and FSBS, states has diminished but continues, denies headache at this time but then alternately states headache is 8/10, has developmental delay

## 2014-12-07 NOTE — Discharge Instructions (Signed)

## 2014-12-07 NOTE — ED Provider Notes (Signed)
[facility] Emergency Department Provider Note  Time seen: 8:06 PM  I have reviewed the triage vital signs and the nursing notes.   HISTORY  Chief Complaint Dizziness    HPI Mario Silva is a 49 y.o. male with a history of mental retardation, gastric reflux presents the emergency department with intermittent dizzy symptoms. According to the patient and the patient's mother, the patient lives at a group home during the week but lives with his mother on the weekends. He states today he has been feeling dizzy especially when standing, mom was concerned that he might fall so she called EMS to have him evaluated. Mom states that the patient frequently has dizzy spells since he has been placed on a diet to lose weight. They have been trying to give him snacks throughout the day, and have him drink plenty of fluids. They do note that today he has only had one bottle of water as the only fluids that he is taken in. Patient denies any chest pain, shortness breath at any time. Denies any dizziness symptoms currently. States he feels very normal at this time.     Past Medical History  Diagnosis Date  . Mental retardation   . Acid reflux     There are no active problems to display for this patient.   Past Surgical History  Procedure Laterality Date  . Diviated septum      No current outpatient prescriptions on file.  Allergies Latex  No family history on file.  Social History History  Substance Use Topics  . Smoking status: Never Smoker   . Smokeless tobacco: Not on file  . Alcohol Use: No    Review of Systems Constitutional: Negative for fever. Eyes: Negative for visual changes. Cardiovascular: Negative for chest pain. Respiratory: Negative for shortness of breath. Gastrointestinal: Negative for abdominal pain Musculoskeletal: Negative for back pain. Neurological: Negative for headaches, focal weakness or numbness. Positive for intermittent  dizziness.  10-point ROS otherwise negative.  ____________________________________________   PHYSICAL EXAM:  VITAL SIGNS: ED Triage Vitals  Enc Vitals Group     BP 12/07/14 1715 123/82 mmHg     Pulse Rate 12/07/14 1715 76     Resp 12/07/14 1715 20     Temp 12/07/14 1715 98.4 F (36.9 C)     Temp src --      SpO2 12/07/14 1715 94 %     Weight 12/07/14 1715 314 lb (142.429 kg)     Height 12/07/14 1715 6\' 4"  (1.93 m)     Head Cir --      Peak Flow --      Pain Score 12/07/14 1719 8     Pain Loc --      Pain Edu? --      Excl. in [place]? --     Constitutional: Alert and oriented. Well appearing and in no distress. Eyes: Normal exam, mild strabismus. ENT   Head: Normocephalic and atraumatic.   Mouth/Throat: Mucous membranes are moist. Cardiovascular: Normal rate, regular rhythm. No murmurs Respiratory: Normal respiratory effort without tachypnea nor retractions. Breath sounds are clear  Gastrointestinal: Soft and nontender. No distention.   Musculoskeletal: Nontender with normal range of motion in all extremities. Neurologic:  Normal speech and language. No gross focal neurologic deficits no pronator drift, normal/5/5 motor in all extremities. Psychiatric: Mood and affect are normal. Speech and behavior are normal.  ____________________________________________    EKG  EKG reviewed interpreted by myself shows normal sinus rhythm  at 69 bpm, narrow QRS, left axis deviation, nonspecific but no concerning ST changes noted.  ____________________________________________   INITIAL IMPRESSION / ASSESSMENT AND PLAN / ED COURSE  Pertinent labs & imaging results that were available during my care of the patient were reviewed by me and considered in my medical decision making (see chart for details).  Patient with intermittent dizziness, labs are within normal limits, EKG is largely within normal limits. Patient appears very well currently. He states he is not drinking about  one bottle of water all day today. I discussed with the patient the need to increase by mouth fluids, and pulp with his primary care doctor exam, patient and mom are agreeable to plan. We'll discharge on this time.  ____________________________________________   FINAL CLINICAL IMPRESSION(S) / ED DIAGNOSES  Dizziness   Harvest Dark, MD 12/07/14 2018

## 2014-12-19 DIAGNOSIS — J301 Allergic rhinitis due to pollen: Secondary | ICD-10-CM | POA: Diagnosis not present

## 2014-12-24 DIAGNOSIS — H1013 Acute atopic conjunctivitis, bilateral: Secondary | ICD-10-CM | POA: Diagnosis not present

## 2014-12-24 DIAGNOSIS — R0782 Intercostal pain: Secondary | ICD-10-CM | POA: Diagnosis not present

## 2014-12-24 DIAGNOSIS — F319 Bipolar disorder, unspecified: Secondary | ICD-10-CM | POA: Diagnosis not present

## 2014-12-24 DIAGNOSIS — J301 Allergic rhinitis due to pollen: Secondary | ICD-10-CM | POA: Diagnosis not present

## 2014-12-24 DIAGNOSIS — G4733 Obstructive sleep apnea (adult) (pediatric): Secondary | ICD-10-CM | POA: Diagnosis not present

## 2014-12-29 IMAGING — CR DG CHEST 2V
1 series · 2 of 2 positions shown · non-contrast
Comparison: None.

CLINICAL DATA: Chest pain for 1 day.

EXAM:
CHEST  2 VIEW

[Series 1: w chest pa · 0.14mm/px · 2 of 2 slices shown]
[im 1/2]
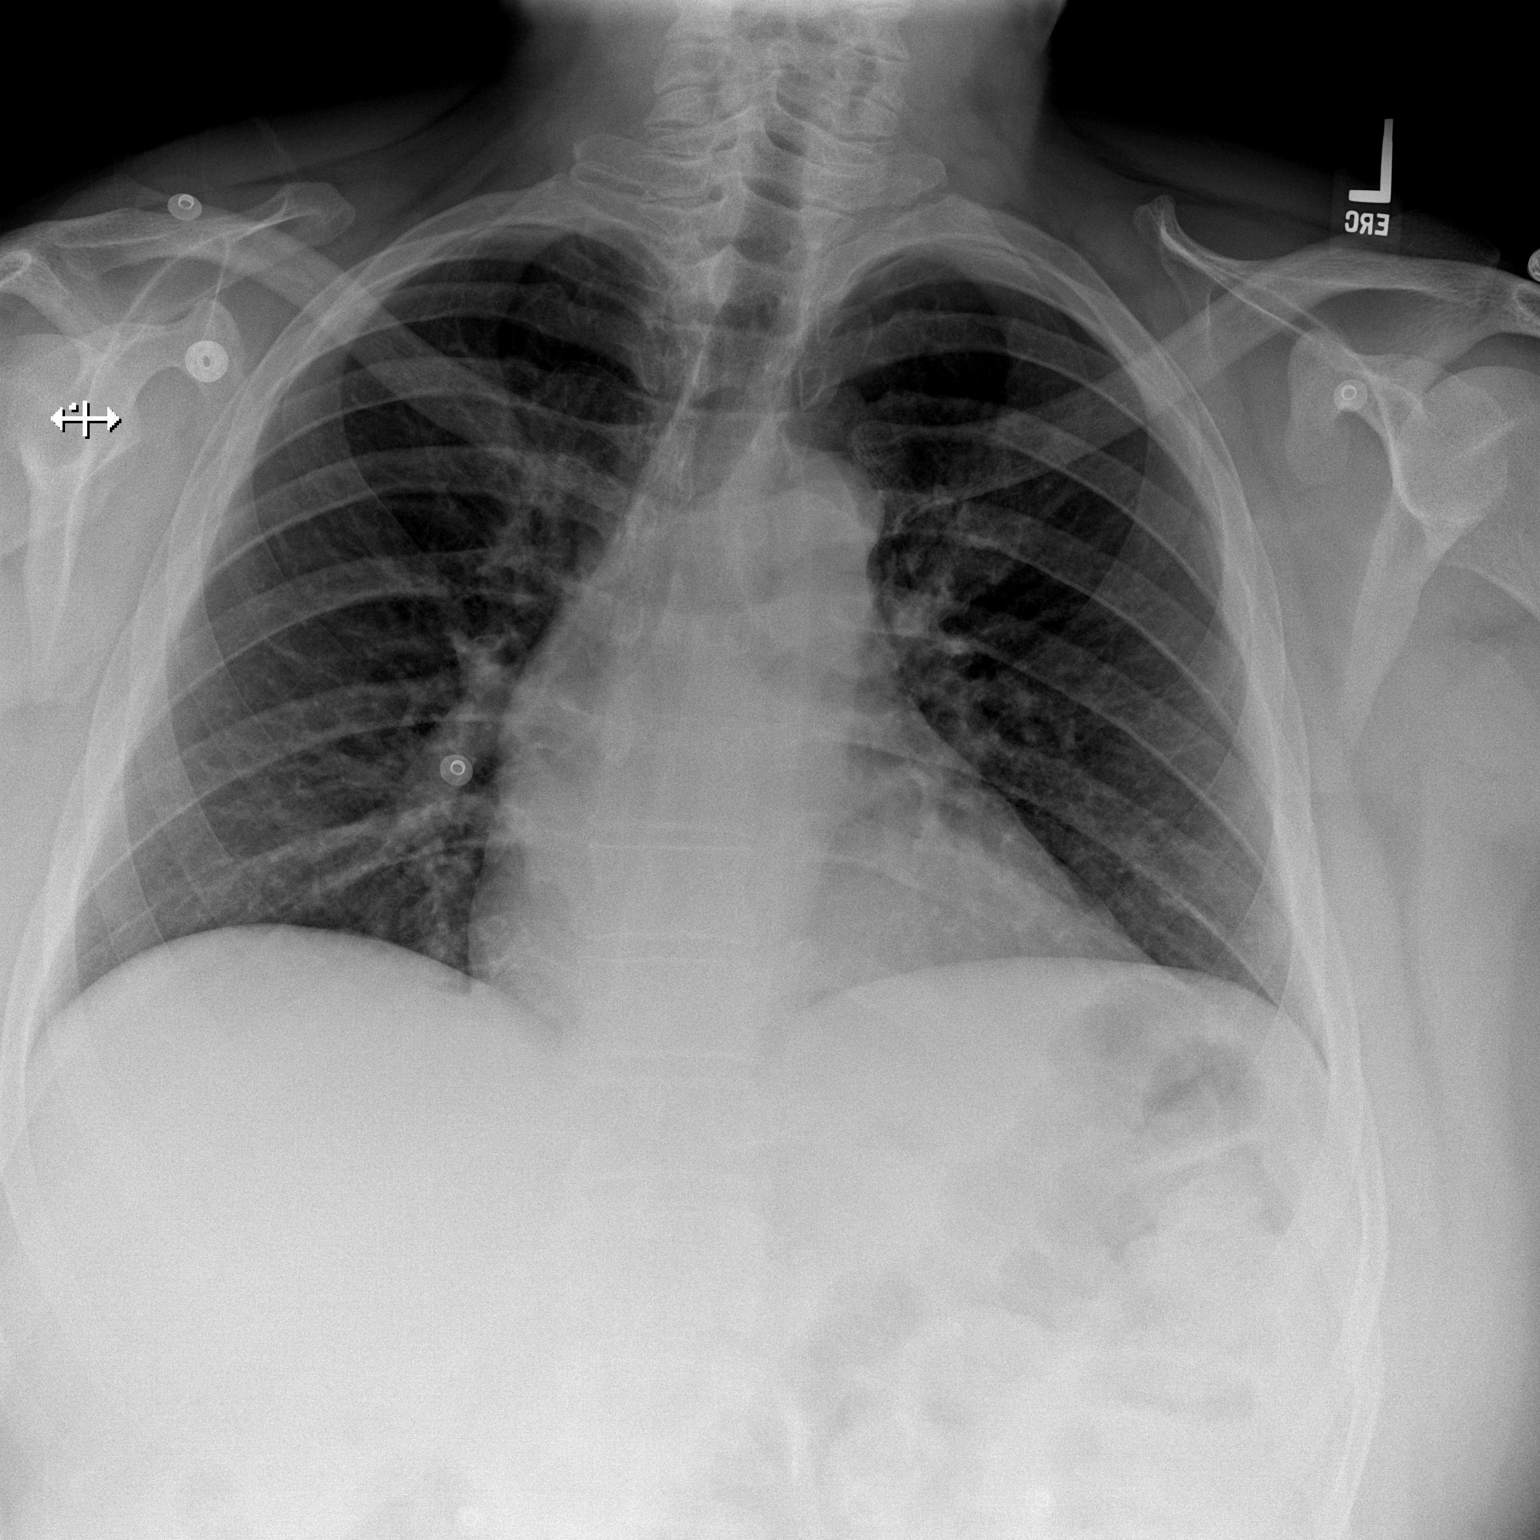
[im 2/2]
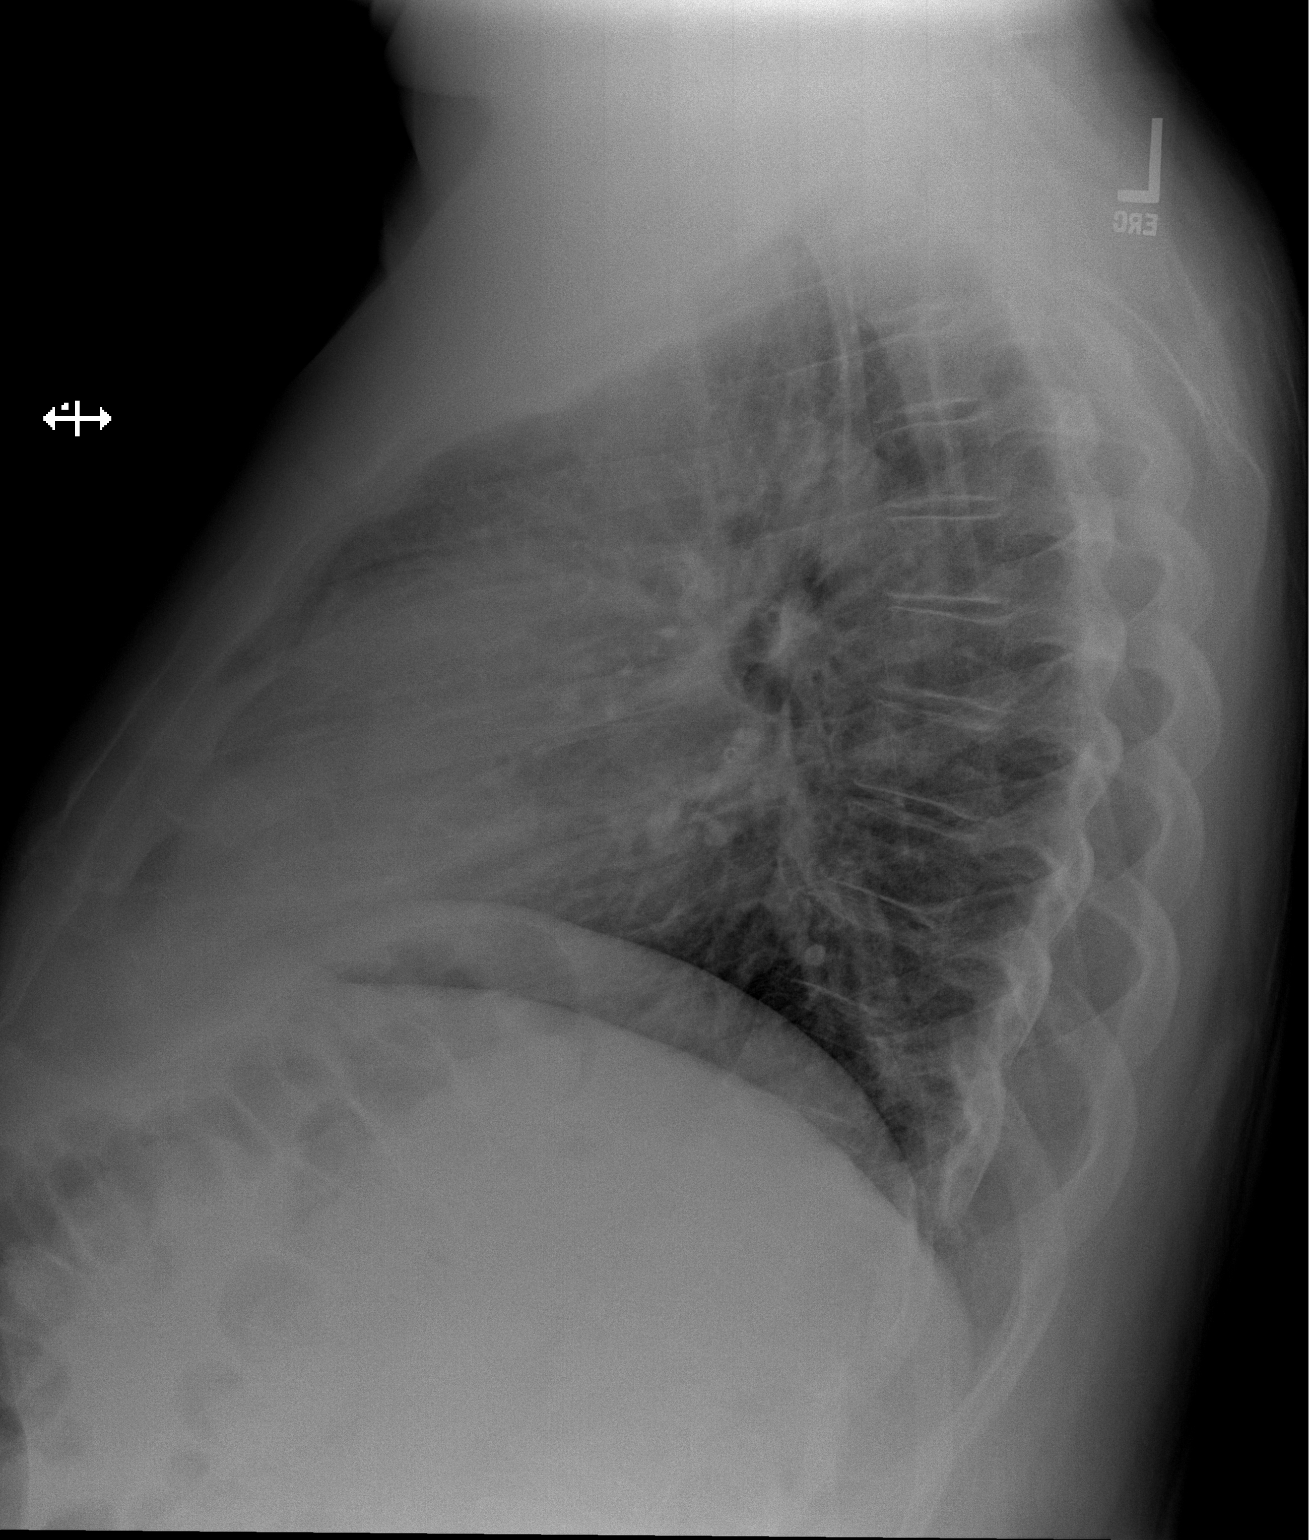

[2 of 2 positions shown; findings below may reference images not displayed]

FINDINGS: Heart size and mediastinal contours are within normal limits. Both
lungs are clear. Visualized skeletal structures are unremarkable.
IMPRESSION: Negative exam.

## 2015-01-02 DIAGNOSIS — J301 Allergic rhinitis due to pollen: Secondary | ICD-10-CM | POA: Diagnosis not present

## 2015-01-07 DIAGNOSIS — F331 Major depressive disorder, recurrent, moderate: Secondary | ICD-10-CM | POA: Diagnosis not present

## 2015-01-07 DIAGNOSIS — J301 Allergic rhinitis due to pollen: Secondary | ICD-10-CM | POA: Diagnosis not present

## 2015-01-14 DIAGNOSIS — H1013 Acute atopic conjunctivitis, bilateral: Secondary | ICD-10-CM | POA: Diagnosis not present

## 2015-01-14 DIAGNOSIS — J301 Allergic rhinitis due to pollen: Secondary | ICD-10-CM | POA: Diagnosis not present

## 2015-01-14 DIAGNOSIS — I83893 Varicose veins of bilateral lower extremities with other complications: Secondary | ICD-10-CM | POA: Diagnosis not present

## 2015-01-14 DIAGNOSIS — L03032 Cellulitis of left toe: Secondary | ICD-10-CM | POA: Diagnosis not present

## 2015-01-14 DIAGNOSIS — G4733 Obstructive sleep apnea (adult) (pediatric): Secondary | ICD-10-CM | POA: Diagnosis not present

## 2015-01-14 DIAGNOSIS — E162 Hypoglycemia, unspecified: Secondary | ICD-10-CM | POA: Diagnosis not present

## 2015-01-14 DIAGNOSIS — R0782 Intercostal pain: Secondary | ICD-10-CM | POA: Diagnosis not present

## 2015-01-14 DIAGNOSIS — Z0001 Encounter for general adult medical examination with abnormal findings: Secondary | ICD-10-CM | POA: Diagnosis not present

## 2015-01-16 DIAGNOSIS — R069 Unspecified abnormalities of breathing: Secondary | ICD-10-CM | POA: Diagnosis not present

## 2015-01-16 DIAGNOSIS — Z125 Encounter for screening for malignant neoplasm of prostate: Secondary | ICD-10-CM | POA: Diagnosis not present

## 2015-01-16 DIAGNOSIS — Z0001 Encounter for general adult medical examination with abnormal findings: Secondary | ICD-10-CM | POA: Diagnosis not present

## 2015-01-16 DIAGNOSIS — E559 Vitamin D deficiency, unspecified: Secondary | ICD-10-CM | POA: Diagnosis not present

## 2015-01-22 DIAGNOSIS — G4733 Obstructive sleep apnea (adult) (pediatric): Secondary | ICD-10-CM | POA: Diagnosis not present

## 2015-01-30 DIAGNOSIS — J301 Allergic rhinitis due to pollen: Secondary | ICD-10-CM | POA: Diagnosis not present

## 2015-02-05 DIAGNOSIS — F319 Bipolar disorder, unspecified: Secondary | ICD-10-CM | POA: Diagnosis not present

## 2015-02-05 DIAGNOSIS — B353 Tinea pedis: Secondary | ICD-10-CM | POA: Diagnosis not present

## 2015-02-05 DIAGNOSIS — I10 Essential (primary) hypertension: Secondary | ICD-10-CM | POA: Diagnosis not present

## 2015-02-05 DIAGNOSIS — R7301 Impaired fasting glucose: Secondary | ICD-10-CM | POA: Diagnosis not present

## 2015-02-05 DIAGNOSIS — E782 Mixed hyperlipidemia: Secondary | ICD-10-CM | POA: Diagnosis not present

## 2015-02-05 DIAGNOSIS — G4733 Obstructive sleep apnea (adult) (pediatric): Secondary | ICD-10-CM | POA: Diagnosis not present

## 2015-02-11 DIAGNOSIS — J301 Allergic rhinitis due to pollen: Secondary | ICD-10-CM | POA: Diagnosis not present

## 2015-02-27 DIAGNOSIS — J301 Allergic rhinitis due to pollen: Secondary | ICD-10-CM | POA: Diagnosis not present

## 2015-03-12 DIAGNOSIS — M79672 Pain in left foot: Secondary | ICD-10-CM | POA: Diagnosis not present

## 2015-03-12 DIAGNOSIS — M79671 Pain in right foot: Secondary | ICD-10-CM | POA: Diagnosis not present

## 2015-03-12 DIAGNOSIS — B351 Tinea unguium: Secondary | ICD-10-CM | POA: Diagnosis not present

## 2015-03-12 DIAGNOSIS — H2513 Age-related nuclear cataract, bilateral: Secondary | ICD-10-CM | POA: Diagnosis not present

## 2015-03-13 DIAGNOSIS — G4733 Obstructive sleep apnea (adult) (pediatric): Secondary | ICD-10-CM | POA: Diagnosis not present

## 2015-03-13 DIAGNOSIS — J301 Allergic rhinitis due to pollen: Secondary | ICD-10-CM | POA: Diagnosis not present

## 2015-03-13 DIAGNOSIS — J3081 Allergic rhinitis due to animal (cat) (dog) hair and dander: Secondary | ICD-10-CM | POA: Diagnosis not present

## 2015-04-08 DIAGNOSIS — J301 Allergic rhinitis due to pollen: Secondary | ICD-10-CM | POA: Diagnosis not present

## 2015-04-29 DIAGNOSIS — F259 Schizoaffective disorder, unspecified: Secondary | ICD-10-CM | POA: Diagnosis not present

## 2015-04-29 DIAGNOSIS — F3112 Bipolar disorder, current episode manic without psychotic features, moderate: Secondary | ICD-10-CM | POA: Diagnosis not present

## 2015-05-09 DIAGNOSIS — G4733 Obstructive sleep apnea (adult) (pediatric): Secondary | ICD-10-CM | POA: Diagnosis not present

## 2015-05-09 DIAGNOSIS — R7301 Impaired fasting glucose: Secondary | ICD-10-CM | POA: Diagnosis not present

## 2015-05-09 DIAGNOSIS — I10 Essential (primary) hypertension: Secondary | ICD-10-CM | POA: Diagnosis not present

## 2015-05-09 DIAGNOSIS — Z23 Encounter for immunization: Secondary | ICD-10-CM | POA: Diagnosis not present

## 2015-05-09 DIAGNOSIS — J301 Allergic rhinitis due to pollen: Secondary | ICD-10-CM | POA: Diagnosis not present

## 2015-05-21 DIAGNOSIS — G4733 Obstructive sleep apnea (adult) (pediatric): Secondary | ICD-10-CM | POA: Diagnosis not present

## 2015-06-04 DIAGNOSIS — F319 Bipolar disorder, unspecified: Secondary | ICD-10-CM | POA: Diagnosis not present

## 2015-06-26 DIAGNOSIS — I7389 Other specified peripheral vascular diseases: Secondary | ICD-10-CM | POA: Diagnosis not present

## 2015-06-26 DIAGNOSIS — L03116 Cellulitis of left lower limb: Secondary | ICD-10-CM | POA: Diagnosis not present

## 2015-06-27 DIAGNOSIS — L03116 Cellulitis of left lower limb: Secondary | ICD-10-CM | POA: Diagnosis not present

## 2015-06-30 DIAGNOSIS — L03116 Cellulitis of left lower limb: Secondary | ICD-10-CM | POA: Diagnosis not present

## 2015-06-30 DIAGNOSIS — I779 Disorder of arteries and arterioles, unspecified: Secondary | ICD-10-CM | POA: Diagnosis not present

## 2017-01-05 DIAGNOSIS — G4733 Obstructive sleep apnea (adult) (pediatric): Secondary | ICD-10-CM | POA: Diagnosis not present

## 2017-01-18 DIAGNOSIS — J449 Chronic obstructive pulmonary disease, unspecified: Secondary | ICD-10-CM | POA: Diagnosis not present

## 2017-01-18 DIAGNOSIS — G4733 Obstructive sleep apnea (adult) (pediatric): Secondary | ICD-10-CM | POA: Diagnosis not present

## 2017-01-20 DIAGNOSIS — Z0001 Encounter for general adult medical examination with abnormal findings: Secondary | ICD-10-CM | POA: Diagnosis not present

## 2017-01-20 DIAGNOSIS — E559 Vitamin D deficiency, unspecified: Secondary | ICD-10-CM | POA: Diagnosis not present

## 2017-01-20 DIAGNOSIS — E782 Mixed hyperlipidemia: Secondary | ICD-10-CM | POA: Diagnosis not present

## 2017-01-20 DIAGNOSIS — R7301 Impaired fasting glucose: Secondary | ICD-10-CM | POA: Diagnosis not present

## 2017-01-20 DIAGNOSIS — E069 Thyroiditis, unspecified: Secondary | ICD-10-CM | POA: Diagnosis not present

## 2017-01-20 DIAGNOSIS — I10 Essential (primary) hypertension: Secondary | ICD-10-CM | POA: Diagnosis not present

## 2017-02-15 DIAGNOSIS — G4733 Obstructive sleep apnea (adult) (pediatric): Secondary | ICD-10-CM | POA: Diagnosis not present

## 2017-02-24 DIAGNOSIS — G4733 Obstructive sleep apnea (adult) (pediatric): Secondary | ICD-10-CM | POA: Diagnosis not present

## 2017-02-24 DIAGNOSIS — J449 Chronic obstructive pulmonary disease, unspecified: Secondary | ICD-10-CM | POA: Diagnosis not present

## 2017-02-24 DIAGNOSIS — G2581 Restless legs syndrome: Secondary | ICD-10-CM | POA: Diagnosis not present

## 2017-03-02 DIAGNOSIS — K625 Hemorrhage of anus and rectum: Secondary | ICD-10-CM | POA: Diagnosis not present

## 2017-03-02 DIAGNOSIS — G4733 Obstructive sleep apnea (adult) (pediatric): Secondary | ICD-10-CM | POA: Diagnosis not present

## 2017-03-09 DIAGNOSIS — G4733 Obstructive sleep apnea (adult) (pediatric): Secondary | ICD-10-CM | POA: Diagnosis not present

## 2017-03-10 ENCOUNTER — Encounter: Payer: Self-pay | Admitting: *Deleted

## 2017-03-14 DIAGNOSIS — G4733 Obstructive sleep apnea (adult) (pediatric): Secondary | ICD-10-CM | POA: Diagnosis not present

## 2017-03-16 ENCOUNTER — Encounter: Payer: Self-pay | Admitting: General Surgery

## 2017-03-16 ENCOUNTER — Ambulatory Visit (INDEPENDENT_AMBULATORY_CARE_PROVIDER_SITE_OTHER): Payer: Medicare Other | Admitting: General Surgery

## 2017-03-16 VITALS — BP 130/74 | HR 80 | Resp 12 | Ht 76.0 in | Wt 279.0 lb

## 2017-03-16 DIAGNOSIS — K625 Hemorrhage of anus and rectum: Secondary | ICD-10-CM | POA: Diagnosis not present

## 2017-03-16 LAB — POC HEMOCCULT BLD/STL (OFFICE/1-CARD/DIAGNOSTIC): Fecal Occult Blood, POC: NEGATIVE

## 2017-03-16 NOTE — Patient Instructions (Addendum)
Return as needed. When ready scheduled colonoscopy. The patient will be encouraged to make use of a daily fiber supplement.  The patient is aware to call back for any questions or concerns.

## 2017-03-16 NOTE — Progress Notes (Signed)
Patient ID: Mario Silva, male   DOB: 1965-11-24, 51 y.o.   MRN: [id number]  Chief Complaint  Patient presents with  . Rectal Bleeding    HPI Mario Silva is a 51 y.o. male here today for evaluation of rectal bleeding. Patient states he noticed some bright red blood in his underwear about three weeks ago. No blood noticed on the paper or no pain. Moves his bowels daily. Mom, Mario Silva is present at visit.  HPI  Past Medical History:  Diagnosis Date  . Depression   . Restless leg syndrome   . Sleep apnea     Past Surgical History:  Procedure Laterality Date  . COLONOSCOPY    . NOSE SURGERY      History reviewed. No pertinent family history.  Social History Social History  Substance Use Topics  . Smoking status: Never Smoker  . Smokeless tobacco: Never Used  . Alcohol use No    Allergies  Allergen Reactions  . Penicillins   . Latex Rash    Current Outpatient Prescriptions  Medication Sig Dispense Refill  . ARIPiprazole (ABILIFY) 5 MG tablet Take 5 mg by mouth daily.  0  . LORazepam (ATIVAN) 0.5 MG tablet Take 0.5 mg by mouth every 8 (eight) hours.     No current facility-administered medications for this visit.     Review of Systems Review of Systems  Constitutional: Negative.   Respiratory: Negative.   Cardiovascular: Negative.   Gastrointestinal: Positive for anal bleeding.    Blood pressure 130/74, pulse 80, resp. rate 12, height 6\' 4"  (1.93 m), weight 279 lb (126.6 kg).  Physical Exam Physical Exam  Constitutional: He appears well-developed and well-nourished.  Cardiovascular: Normal rate, regular rhythm and normal heart sounds.   Pulmonary/Chest: Effort normal and breath sounds normal.  Neurological: He is alert.  Skin: Skin is warm and dry.  There is a small planters wart on the lateral aspect of the left forefoot.  Data Reviewed Anoscopy showed mild prominence of internal hemorrhoids with no source of bleeding.  Stools Hemoccult  negative.  No evidence of fissure or fistula.  Assessment    History of rectal bleeding on several occasions several weeks ago. None recurrent recent episodes.    Plan    Options for management include simple observation versus screening colonoscopy based on the patient's age.    Return as needed. There are some family health issues that the patient's mother is attending 2, and  she'll likely contact the office when these of resolved to schedule colonoscopy. Pick up Occlusal HP at Drug store. The patient will be encouraged to make use of a daily fiber supplement.  The patient is aware to call back for any questions or concerns.   HPI, Physical Exam, Assessment and Plan have been scribed under the direction and in the presence of Hervey Ard, MD.  Gaspar Cola, CMA  I have completed the exam and reviewed the above documentation for accuracy and completeness.  I agree with the above.  Haematologist has been used and any errors in dictation or transcription are unintentional.  Hervey Ard, M.D., F.A.C.S.  Mario Silva 03/18/2017, 7:55 PM

## 2017-03-18 DIAGNOSIS — K625 Hemorrhage of anus and rectum: Secondary | ICD-10-CM | POA: Insufficient documentation

## 2017-04-03 ENCOUNTER — Encounter: Payer: Self-pay | Admitting: General Surgery

## 2017-04-13 DIAGNOSIS — G4733 Obstructive sleep apnea (adult) (pediatric): Secondary | ICD-10-CM | POA: Diagnosis not present

## 2017-04-29 DIAGNOSIS — G4733 Obstructive sleep apnea (adult) (pediatric): Secondary | ICD-10-CM | POA: Diagnosis not present

## 2017-04-29 DIAGNOSIS — Z23 Encounter for immunization: Secondary | ICD-10-CM | POA: Diagnosis not present

## 2017-05-14 DIAGNOSIS — G4733 Obstructive sleep apnea (adult) (pediatric): Secondary | ICD-10-CM | POA: Diagnosis not present

## 2017-06-22 DIAGNOSIS — Z7689 Persons encountering health services in other specified circumstances: Secondary | ICD-10-CM | POA: Diagnosis not present

## 2017-06-22 DIAGNOSIS — F809 Developmental disorder of speech and language, unspecified: Secondary | ICD-10-CM | POA: Diagnosis not present

## 2017-06-22 DIAGNOSIS — G4733 Obstructive sleep apnea (adult) (pediatric): Secondary | ICD-10-CM | POA: Diagnosis not present

## 2017-06-22 DIAGNOSIS — R6250 Unspecified lack of expected normal physiological development in childhood: Secondary | ICD-10-CM | POA: Diagnosis not present

## 2017-08-25 ENCOUNTER — Ambulatory Visit: Payer: Self-pay | Admitting: Internal Medicine

## 2017-09-22 ENCOUNTER — Ambulatory Visit: Payer: Medicare Other | Admitting: Internal Medicine

## 2017-09-22 ENCOUNTER — Encounter: Payer: Self-pay | Admitting: Internal Medicine

## 2017-09-22 DIAGNOSIS — G2581 Restless legs syndrome: Secondary | ICD-10-CM

## 2017-09-22 DIAGNOSIS — G4730 Sleep apnea, unspecified: Secondary | ICD-10-CM | POA: Insufficient documentation

## 2017-09-22 DIAGNOSIS — G4733 Obstructive sleep apnea (adult) (pediatric): Secondary | ICD-10-CM | POA: Diagnosis not present

## 2017-09-22 DIAGNOSIS — Z9989 Dependence on other enabling machines and devices: Secondary | ICD-10-CM | POA: Diagnosis not present

## 2017-09-22 NOTE — Progress Notes (Signed)
[facility] [place], [place] [address]  Pulmonary Sleep Medicine  Office Visit Note  Patient Name: Mario Silva DOB: [birth year] MRN [id number]  Date of Service: 09/22/2017  Complaints/HPI:  Doing fairly well has been compliant with CPAP device.  Has had some issues with his mask and also cleaning.  High lipid is machine did some extensive reviewing of failure keeping the machine clean.  He is going to also call his DME provider for further instruction on his mask as well as is admitted for K shin changed.  Noted patient is to the hospital no chest pain no palpitations.  ROS  General: (-) fever, (-) chills, (-) night sweats, (-) weakness Skin: (-) rashes, (-) itching,. Eyes: (-) visual changes, (-) redness, (-) itching. Nose and Sinuses: (-) nasal stuffiness or itchiness, (-) postnasal drip, (-) nosebleeds, (-) sinus trouble. Mouth and Throat: (-) sore throat, (-) hoarseness. Neck: (-) swollen glands, (-) enlarged thyroid, (-) neck pain. Respiratory: - cough, (-) bloody sputum, - shortness of breath, - wheezing. Cardiovascular: - ankle swelling, (-) chest pain. Lymphatic: (-) lymph node enlargement. Neurologic: (-) numbness, (-) tingling. Psychiatric: (-) anxiety, (-) depression   Current Medication: Outpatient Encounter Medications as of 09/22/2017  Medication Sig  . ARIPiprazole (ABILIFY) 5 MG tablet Take 5 mg by mouth daily.  Marland Kitchen LORazepam (ATIVAN) 0.5 MG tablet Take 0.5 mg by mouth every 8 (eight) hours.   No facility-administered encounter medications on file as of 09/22/2017.     Surgical History: Past Surgical History:  Procedure Laterality Date  . COLONOSCOPY    . diviated septum    . NOSE SURGERY      Medical History: Past Medical History:  Diagnosis Date  . Acid reflux   . Depression   . Mental retardation   . Restless leg syndrome   . Sleep apnea     Family History: Family History  Problem Relation Age of Onset  .  Hypertension Mother   . Hyperlipidemia Mother   . Anxiety disorder Mother   . Thyroid disease Mother     Social History: Social History   Socioeconomic History  . Marital status: Single    Spouse name: Not on file  . Number of children: Not on file  . Years of education: Not on file  . Highest education level: Not on file  Occupational History  . Not on file  Social Needs  . Financial resource strain: Not on file  . Food insecurity:    Worry: Not on file    Inability: Not on file  . Transportation needs:    Medical: Not on file    Non-medical: Not on file  Tobacco Use  . Smoking status: Never Smoker  . Smokeless tobacco: Never Used  Substance and Sexual Activity  . Alcohol use: No  . Drug use: No  . Sexual activity: Not on file  Lifestyle  . Physical activity:    Days per week: Not on file    Minutes per session: Not on file  . Stress: Not on file  Relationships  . Social connections:    Talks on phone: Not on file    Gets together: Not on file    Attends religious service: Not on file    Active member of club or organization: Not on file    Attends meetings of clubs or organizations: Not on file    Relationship status: Not on file  . Intimate partner violence:    Fear  of current or ex partner: Not on file    Emotionally abused: Not on file    Physically abused: Not on file    Forced sexual activity: Not on file  Other Topics Concern  . Not on file  Social History Narrative   ** Merged History Encounter **        Vital Signs: Blood pressure 113/75, pulse 69, resp. rate 16, height 6\' 4"  (1.93 m), weight 282 lb 3.2 oz (128 kg), SpO2 95 %.  Examination: General Appearance: The patient is well-developed, well-nourished, and in no distress. Skin: Gross inspection of skin unremarkable. Head: normocephalic, no gross deformities. Eyes: no gross deformities noted. ENT: ears appear grossly normal no exudates. Neck: Supple. No thyromegaly. No LAD. Respiratory:  no rhonchi noted. Cardiovascular: Normal S1 and S2 without murmur or rub. Extremities: No cyanosis. pulses are equal. Neurologic: Alert and oriented. No involuntary movements.  LABS: No results found for this or any previous visit (from the past 2160 hour(s)).  Radiology: No results found.  No results found.  No results found.    Assessment and Plan: Patient Active Problem List   Diagnosis Date Noted  . Sleep apnea 09/22/2017  . Rectal bleeding 03/18/2017    1. OSA  Doing well on current pressures will be continued will continue monitor in the sleep clinic for his compliance 2. RLS under control at this time we will continue with supportive care 3. Obesity again discussed weight loss patient still wants to work on dietary control an exercise  General Counseling: I have discussed the findings of the evaluation and examination with [name].  I have also discussed any further diagnostic evaluation thatmay be needed or ordered today. [name] verbalizes understanding of the findings of todays visit. We also reviewed his medications today and discussed drug interactions and side effects including but not limited excessive drowsiness and altered mental states. We also discussed that there is always a risk not just to him but also people around him. he has been encouraged to call the office with any questions or concerns that should arise related to todays visit.    Time spent: 62min  I have personally obtained a history, examined the patient, evaluated laboratory and imaging results, formulated the assessment and plan and placed orders.    Allyne Gee, MD [facility] Pulmonary and Critical Care Sleep medicine

## 2017-09-22 NOTE — Patient Instructions (Signed)

## 2017-09-23 ENCOUNTER — Telehealth: Payer: Self-pay

## 2017-09-23 ENCOUNTER — Other Ambulatory Visit: Payer: Self-pay | Admitting: Internal Medicine

## 2017-09-23 ENCOUNTER — Other Ambulatory Visit: Payer: Self-pay

## 2017-09-26 NOTE — Telephone Encounter (Signed)
Sent Abilify rx to pharmacy.  dbs

## 2017-09-27 ENCOUNTER — Other Ambulatory Visit: Payer: Self-pay | Admitting: Internal Medicine

## 2017-10-19 ENCOUNTER — Other Ambulatory Visit: Payer: Self-pay | Admitting: Nurse Practitioner

## 2017-10-19 MED ORDER — ARIPIPRAZOLE 5 MG PO TABS: ORAL_TABLET | ORAL | 3 refills | Status: DC

## 2018-01-26 ENCOUNTER — Ambulatory Visit (INDEPENDENT_AMBULATORY_CARE_PROVIDER_SITE_OTHER): Payer: Medicare Other | Admitting: General Surgery

## 2018-01-26 ENCOUNTER — Encounter: Payer: Self-pay | Admitting: General Surgery

## 2018-01-26 VITALS — BP 114/78 | HR 68 | Resp 13 | Ht 74.0 in | Wt 280.0 lb

## 2018-01-26 DIAGNOSIS — Z1211 Encounter for screening for malignant neoplasm of colon: Secondary | ICD-10-CM | POA: Diagnosis not present

## 2018-01-26 NOTE — Patient Instructions (Signed)
Colonoscopy, Adult A colonoscopy is an exam to look at the entire large intestine. During the exam, a lubricated, bendable tube is inserted into the anus and then passed into the rectum, colon, and other parts of the large intestine. A colonoscopy is often done as a part of normal colorectal screening or in response to certain symptoms, such as anemia, persistent diarrhea, abdominal pain, and blood in the stool. The exam can help screen for and diagnose medical problems, including:  Tumors.  Polyps.  Inflammation.  Areas of bleeding.  Tell a health care provider about:  Any allergies you have.  All medicines you are taking, including vitamins, herbs, eye drops, creams, and over-the-counter medicines.  Any problems you or family members have had with anesthetic medicines.  Any blood disorders you have.  Any surgeries you have had.  Any medical conditions you have.  Any problems you have had passing stool. What are the risks? Generally, this is a safe procedure. However, problems may occur, including:  Bleeding.  A tear in the intestine.  A reaction to medicines given during the exam.  Infection (rare).  What happens before the procedure? Eating and drinking restrictions Follow instructions from your health care provider about eating and drinking, which may include:  A few days before the procedure - follow a low-fiber diet. Avoid nuts, seeds, dried fruit, raw fruits, and vegetables.  1-3 days before the procedure - follow a clear liquid diet. Drink only clear liquids, such as clear broth or bouillon, black coffee or tea, clear juice, clear soft drinks or sports drinks, gelatin dessert, and popsicles. Avoid any liquids that contain red or purple dye.  On the day of the procedure - do not eat or drink anything during the 2 hours before the procedure, or within the time period that your health care provider recommends.  Bowel prep If you were prescribed an oral bowel prep  to clean out your colon:  Take it as told by your health care provider. Starting the day before your procedure, you will need to drink a large amount of medicated liquid. The liquid will cause you to have multiple loose stools until your stool is almost clear or light green.  If your skin or anus gets irritated from diarrhea, you may use these to relieve the irritation: ? Medicated wipes, such as adult wet wipes with aloe and vitamin E. ? A skin soothing-product like petroleum jelly.  If you vomit while drinking the bowel prep, take a break for up to 60 minutes and then begin the bowel prep again. If vomiting continues and you cannot take the bowel prep without vomiting, call your health care provider.  General instructions  Ask your health care provider about changing or stopping your regular medicines. This is especially important if you are taking diabetes medicines or blood thinners.  Plan to have someone take you home from the hospital or clinic. What happens during the procedure?  An IV tube may be inserted into one of your veins.  You will be given medicine to help you relax (sedative).  To reduce your risk of infection: ? Your health care team will wash or sanitize their hands. ? Your anal area will be washed with soap.  You will be asked to lie on your side with your knees bent.  Your health care provider will lubricate a long, thin, flexible tube. The tube will have a camera and a light on the end.  The tube will be inserted into your   anus.  The tube will be gently eased through your rectum and colon.  Air will be delivered into your colon to keep it open. You may feel some pressure or cramping.  The camera will be used to take images during the procedure.  A small tissue sample may be removed from your body to be examined under a microscope (biopsy). If any potential problems are found, the tissue will be sent to a lab for testing.  If small polyps are found, your  health care provider may remove them and have them checked for cancer cells.  The tube that was inserted into your anus will be slowly removed. The procedure may vary among health care providers and hospitals. What happens after the procedure?  Your blood pressure, heart rate, breathing rate, and blood oxygen level will be monitored until the medicines you were given have worn off.  Do not drive for 24 hours after the exam.  You may have a small amount of blood in your stool.  You may pass gas and have mild abdominal cramping or bloating due to the air that was used to inflate your colon during the exam.  It is up to you to get the results of your procedure. Ask your health care provider, or the department performing the procedure, when your results will be ready. This information is not intended to replace advice given to you by your health care provider. Make sure you discuss any questions you have with your health care provider. Document Released: 06/11/2000 Document Revised: 04/14/2016 Document Reviewed: 08/26/2015 Elsevier Interactive Patient Education  2018 Elsevier Inc.  

## 2018-01-26 NOTE — Progress Notes (Signed)
Patient ID: Mario Silva, male   DOB: 05-May-1966, 52 y.o.   MRN: [id number]  Chief Complaint  Patient presents with  . Colonoscopy    HPI Mario Silva is a 52 y.o. male here today for a evaluation of a colonoscopy. No GI problems at this time. Moves his bowels daily. No rectal bleeding. Last colonoscopy 05/09/2008.  Mom, Mario Silva is present at visit.   HPI  Past Medical History:  Diagnosis Date  . Acid reflux   . Depression   . Mental retardation   . Restless leg syndrome   . Sleep apnea     Past Surgical History:  Procedure Laterality Date  . COLONOSCOPY    . diviated septum    . NOSE SURGERY      Family History  Problem Relation Age of Onset  . Hypertension Mother   . Hyperlipidemia Mother   . Anxiety disorder Mother   . Thyroid disease Mother     Social History Social History   Tobacco Use  . Smoking status: Never Smoker  . Smokeless tobacco: Never Used  Substance Use Topics  . Alcohol use: No  . Drug use: No    Allergies  Allergen Reactions  . Penicillins   . Latex Other (See Comments) and Rash    blister  . Latex Rash    Current Outpatient Medications  Medication Sig Dispense Refill  . ARIPiprazole (ABILIFY) 5 MG tablet TAKE 1 TABLET BY MOUTH EVERYDAY AT BEDTIME 90 tablet 3  . cholecalciferol (VITAMIN D) 1000 units tablet Take 1,000 Units by mouth daily.    . hydrocortisone 2.5 % cream APPLY THIN LAYER TO FACE TWICE DAILY AS NEEDED FOR 1 TO 2 WEEKS  2  . polycarbophil (FIBERCON) 625 MG tablet Take 625 mg by mouth daily.    . Thiamine HCl (VITAMIN B-1) 250 MG tablet Take 250 mg by mouth daily.     No current facility-administered medications for this visit.     Review of Systems Review of Systems  Constitutional: Negative.   Respiratory: Negative.   Cardiovascular: Negative.   Gastrointestinal: Negative.     Blood pressure 114/78, pulse 68, resp. rate 13, height 6\' 2"  (1.88 m), weight 280 lb (127 kg).  Physical Exam Physical  Exam  Constitutional: He is oriented to person, place, and time. He appears well-developed and well-nourished.  Cardiovascular: Normal rate, regular rhythm and normal heart sounds.  Pulmonary/Chest: Effort normal and breath sounds normal.  Neurological: He is alert and oriented to person, place, and time.  Skin: Skin is warm and dry.    Data Reviewed May 09, 2008 colonoscopy for chronic diarrhea was reviewed.  Normal colon.  Biopsies of the colon and terminal ileum completed.  Pathology showed ileal mucosa with intact villi.  Colonic mucosa with intact crypt architecture.  No evidence of lymphocytes or granulomas.  Laboratory studies of December 14, 2017 showed white blood cell count of 6400, hemoglobin 15.6 with an MCV of 88, platelet count of 220,000.  Normal differential.  Normal electrolytes.  Creatinine 0.8 with an estimated GFR of 102.  Normal liver function studies.  Hemoglobin A1c 5.6.  Assessment    Candidate for screening colonoscopy.    Plan    Colonoscopy with possible biopsy/polypectomy prn: Information regarding the procedure, including its potential risks and complications (including but not limited to perforation of the bowel, which may require emergency surgery to repair, and bleeding) was verbally given to the patient. Educational information regarding lower intestinal endoscopy was  given to the patient. Written instructions for how to complete the bowel prep using Miralax were provided. The importance of drinking ample fluids to avoid dehydration as a result of the prep emphasized.   The patient's mother is having follow-up mammogram films next week and wanted to defer scheduling until results of those images are available and any possible procedures completed.  HPI, Physical Exam, Assessment and Plan have been scribed under the direction and in the presence of Hervey Ard, MD.  Gaspar Cola, CMA  I have completed the exam and reviewed the above  documentation for accuracy and completeness.  I agree with the above.  Haematologist has been used and any errors in dictation or transcription are unintentional.  Hervey Ard, M.D., F.A.C.S.  [name] 01/27/2018, 4:51 PM

## 2018-01-27 DIAGNOSIS — Z1211 Encounter for screening for malignant neoplasm of colon: Secondary | ICD-10-CM | POA: Insufficient documentation

## 2018-01-30 ENCOUNTER — Telehealth: Payer: Self-pay

## 2018-01-30 MED ORDER — POLYETHYLENE GLYCOL 3350 17 GM/SCOOP PO POWD: 1.0000 | Freq: Once | ORAL | 0 refills | Status: AC

## 2018-01-30 NOTE — Telephone Encounter (Signed)
Patient's mother called to schedule the patient's colonoscopy. The patient is scheduled for a Colonoscopy at [facility] on 02/08/18. They are aware to call the day before to get their arrival time. He will bring his C-Pap machine with him the day of. Miralax prescription has been sent into the patient's pharmacy. The patient is aware of date and instructions.

## 2018-01-31 ENCOUNTER — Other Ambulatory Visit: Payer: Self-pay | Admitting: General Surgery

## 2018-01-31 DIAGNOSIS — Z1211 Encounter for screening for malignant neoplasm of colon: Secondary | ICD-10-CM

## 2018-02-02 ENCOUNTER — Telehealth: Payer: Self-pay | Admitting: *Deleted

## 2018-02-02 NOTE — Telephone Encounter (Signed)
Patient called and stated that he went to the doctor today with an infection in his right leg, they prescribed him doxycycline, he is unsure of the dosage as he has not picked up his prescription yet. He wanted to make sure it was okay to take doxycyline and still have his upcoming Colonoscopy on Wednesday 02/08/18 or would this need to be rescheduled.

## 2018-02-03 NOTE — Telephone Encounter (Signed)
OK to proceed with colonoscopy as scheduled.

## 2018-02-06 NOTE — Telephone Encounter (Signed)
Left message for patient to call the office back

## 2018-02-08 ENCOUNTER — Ambulatory Visit
Admission: RE | Admit: 2018-02-08 | Discharge: 2018-02-08 | Disposition: A | Discharge status: Home / Self Care | Payer: Medicare Other | Source: Ambulatory Visit | Attending: General Surgery | Admitting: General Surgery

## 2018-02-08 ENCOUNTER — Encounter
Admission: RE | Disposition: A | Discharge status: Home / Self Care | Payer: Self-pay | Source: Ambulatory Visit | Attending: General Surgery

## 2018-02-08 ENCOUNTER — Encounter: Payer: Self-pay | Admitting: Anesthesiology

## 2018-02-08 ENCOUNTER — Ambulatory Visit: Payer: Medicare Other | Admitting: Certified Registered Nurse Anesthetist

## 2018-02-08 DIAGNOSIS — Z79899 Other long term (current) drug therapy: Secondary | ICD-10-CM | POA: Insufficient documentation

## 2018-02-08 DIAGNOSIS — G2581 Restless legs syndrome: Secondary | ICD-10-CM | POA: Diagnosis not present

## 2018-02-08 DIAGNOSIS — Z1211 Encounter for screening for malignant neoplasm of colon: Secondary | ICD-10-CM | POA: Diagnosis not present

## 2018-02-08 DIAGNOSIS — F329 Major depressive disorder, single episode, unspecified: Secondary | ICD-10-CM | POA: Insufficient documentation

## 2018-02-08 DIAGNOSIS — F79 Unspecified intellectual disabilities: Secondary | ICD-10-CM | POA: Insufficient documentation

## 2018-02-08 DIAGNOSIS — G4730 Sleep apnea, unspecified: Secondary | ICD-10-CM | POA: Insufficient documentation

## 2018-02-08 HISTORY — PX: COLONOSCOPY WITH PROPOFOL: SHX5780

## 2018-02-08 SURGERY — COLONOSCOPY WITH PROPOFOL
Anesthesia: General

## 2018-02-08 MED ORDER — LIDOCAINE HCL (PF) 1 % IJ SOLN
INTRAMUSCULAR | Status: AC
  Administered 2018-02-08: 0.3 mL via INTRADERMAL
  Filled 2018-02-08: qty 2

## 2018-02-08 MED ORDER — SUCCINYLCHOLINE CHLORIDE 20 MG/ML IJ SOLN
INTRAMUSCULAR | Status: AC
  Filled 2018-02-08: qty 1

## 2018-02-08 MED ORDER — PHENYLEPHRINE HCL 10 MG/ML IJ SOLN
INTRAMUSCULAR | Status: DC | PRN
  Administered 2018-02-08 (×2): 50 ug via INTRAVENOUS

## 2018-02-08 MED ORDER — GLYCOPYRROLATE 0.2 MG/ML IJ SOLN
INTRAMUSCULAR | Status: AC
  Filled 2018-02-08: qty 1

## 2018-02-08 MED ORDER — PHENYLEPHRINE HCL 10 MG/ML IJ SOLN
INTRAMUSCULAR | Status: AC
  Filled 2018-02-08: qty 1

## 2018-02-08 MED ORDER — PROPOFOL 10 MG/ML IV BOLUS
INTRAVENOUS | Status: DC | PRN
  Administered 2018-02-08: 70 mg via INTRAVENOUS

## 2018-02-08 MED ORDER — LIDOCAINE HCL (PF) 1 % IJ SOLN
2.0000 mL | Freq: Once | INTRAMUSCULAR | Status: AC
  Administered 2018-02-08: 0.3 mL via INTRADERMAL

## 2018-02-08 MED ORDER — SODIUM CHLORIDE 0.9 % IV SOLN
INTRAVENOUS | Status: DC
  Administered 2018-02-08: 1000 mL via INTRAVENOUS

## 2018-02-08 MED ORDER — GLYCOPYRROLATE 0.2 MG/ML IJ SOLN
INTRAMUSCULAR | Status: DC | PRN
  Administered 2018-02-08: 0.2 mg via INTRAVENOUS

## 2018-02-08 MED ORDER — PROPOFOL 500 MG/50ML IV EMUL
INTRAVENOUS | Status: AC
  Filled 2018-02-08: qty 250

## 2018-02-08 MED ORDER — PROPOFOL 500 MG/50ML IV EMUL
INTRAVENOUS | Status: DC | PRN
  Administered 2018-02-08: 150 ug/kg/min via INTRAVENOUS

## 2018-02-08 NOTE — Anesthesia Postprocedure Evaluation (Signed)
Anesthesia Post Note  Patient: Josua Ferrebee Damman  Procedure(s) Performed: COLONOSCOPY WITH PROPOFOL (N/A )  Patient location during evaluation: Endoscopy Anesthesia Type: General Level of consciousness: awake and alert Pain management: pain level controlled Vital Signs Assessment: post-procedure vital signs reviewed and stable Respiratory status: spontaneous breathing, nonlabored ventilation, respiratory function stable and patient connected to nasal cannula oxygen Cardiovascular status: blood pressure returned to baseline and stable Postop Assessment: no apparent nausea or vomiting Anesthetic complications: no     Last Vitals:  Vitals:   02/08/18 0850 02/08/18 0927  BP: 108/80   Pulse:    Resp:  18  Temp:    SpO2:  99%    Last Pain:  Vitals:   02/08/18 0927  TempSrc:   PainSc: 0-No pain                 Martha Clan

## 2018-02-08 NOTE — Anesthesia Post-op Follow-up Note (Signed)
Anesthesia QCDR form completed.        

## 2018-02-08 NOTE — Transfer of Care (Signed)
Immediate Anesthesia Transfer of Care Note  Patient: Mario Silva  Procedure(s) Performed: COLONOSCOPY WITH PROPOFOL (N/A )  Patient Location: PACU  Anesthesia Type:General  Level of Consciousness: awake, alert  and oriented  Airway & Oxygen Therapy: Patient Spontanous Breathing and Patient connected to nasal cannula oxygen  Post-op Assessment: Report given to RN and Post -op Vital signs reviewed and stable  Post vital signs: Reviewed and stable  Last Vitals:  Vitals Value Taken Time  BP 94/61 02/08/2018  8:34 AM  Temp 36.1 C 02/08/2018  8:34 AM  Pulse 60 02/08/2018  8:38 AM  Resp 14 02/08/2018  8:38 AM  SpO2 98 % 02/08/2018  8:38 AM  Vitals shown include unvalidated device data.  Last Pain:  Vitals:   02/08/18 0834  TempSrc: Tympanic  PainSc: 0-No pain         Complications: No apparent anesthesia complications

## 2018-02-08 NOTE — Anesthesia Preprocedure Evaluation (Signed)
Anesthesia Evaluation  Patient identified by MRN, date of birth, ID band Patient awake    Reviewed: Allergy & Precautions, H&P , NPO status , Patient's Chart, lab work & pertinent test results, reviewed documented beta blocker date and time   History of Anesthesia Complications Negative for: history of anesthetic complications  Airway Mallampati: I  TM Distance: >3 FB Neck ROM: full    Dental  (+) Dental Advidsory Given, Caps, Teeth Intact   Pulmonary neg shortness of breath, sleep apnea and Continuous Positive Airway Pressure Ventilation , neg COPD, neg recent URI,           Cardiovascular Exercise Tolerance: Good negative cardio ROS       Neuro/Psych Seizures -, Well Controlled,  PSYCHIATRIC DISORDERS (Mental retardation) Depression    GI/Hepatic Neg liver ROS, GERD  ,  Endo/Other  negative endocrine ROS  Renal/GU negative Renal ROS  negative genitourinary   Musculoskeletal   Abdominal   Peds  Hematology negative hematology ROS (+)   Anesthesia Other Findings Past Medical History: No date: Acid reflux No date: Depression No date: Mental retardation No date: Restless leg syndrome No date: Sleep apnea   Reproductive/Obstetrics negative OB ROS                             Anesthesia Physical Anesthesia Plan  ASA: II  Anesthesia Plan: General   Post-op Pain Management:    Induction: Intravenous  PONV Risk Score and Plan: 2 and Propofol infusion and TIVA  Airway Management Planned: Nasal Cannula  Additional Equipment:   Intra-op Plan:   Post-operative Plan:   Informed Consent: I have reviewed the patients History and Physical, chart, labs and discussed the procedure including the risks, benefits and alternatives for the proposed anesthesia with the patient or authorized representative who has indicated his/her understanding and acceptance.   Dental Advisory Given  Plan  Discussed with: Anesthesiologist, CRNA and Surgeon  Anesthesia Plan Comments:         Anesthesia Quick Evaluation

## 2018-02-08 NOTE — Op Note (Addendum)
[facility] Patient Name: Mario Silva Procedure Date: 02/08/2018 7:21 AM MRN: [id number] Account #: [id number] Date of Birth: Sep 10, 1965 Admit Type: Outpatient Age: 52 Room: [facility] ENDO ROOM 1 Gender: Male Note Status: Finalized Procedure:            Colonoscopy Indications:          Screening for colorectal malignant neoplasm Providers:            Robert Bellow, MD Referring MD:         Tracie Harrier, MD (Referring MD) Medicines:            Monitored Anesthesia Care Complications:        No immediate complications. Procedure:            Pre-Anesthesia Assessment:                       - Prior to the procedure, a History and Physical was                        performed, and patient medications, allergies and                        sensitivities were reviewed. The patient's tolerance of                        previous anesthesia was reviewed.                       - The risks and benefits of the procedure and the                        sedation options and risks were discussed with the                        patient. All questions were answered and informed                        consent was obtained.                       After obtaining informed consent, the colonoscope was                        passed under direct vision. Throughout the procedure,                        the patient's blood pressure, pulse, and oxygen                        saturations were monitored continuously. The                        Colonoscope was introduced through the anus and                        advanced to the the cecum, identified by appendiceal                        orifice and ileocecal valve. The colonoscopy was  somewhat difficult due to poor bowel prep. The patient                        tolerated the procedure well. The quality of the bowel                        preparation was adequate to identify polyps 6 mm and               larger in size. Findings:      The entire examined colon appeared normal on direct and retroflexion       views. Impression:           - The entire examined colon is normal on direct and                        retroflexion views.                       - No specimens collected. Recommendation:       - Recommend Cologuard test in 5 years. Procedure Code(s):    --- Professional ---                       [phone], Colonoscopy, flexible; diagnostic, including                        collection of specimen(s) by brushing or washing, when                        performed (separate procedure) Diagnosis Code(s):    --- Professional ---                       Z12.11, Encounter for screening for malignant neoplasm                        of colon CPT copyright 2017 American Medical Association. All rights reserved. The codes documented in this report are preliminary and upon coder review may  be revised to meet current compliance requirements. Robert Bellow, MD 02/08/2018 8:24:12 AM This report has been signed electronically. Number of Addenda: 0 Note Initiated On: 02/08/2018 7:21 AM Scope Withdrawal Time: 0 hours 14 minutes 38 seconds  Total Procedure Duration: 0 hours 41 minutes 42 seconds       [facility]

## 2018-02-08 NOTE — Anesthesia Procedure Notes (Signed)
Performed by: Demetrius Charity, CRNA Pre-anesthesia Checklist: Patient identified, Emergency Drugs available, Patient being monitored, Suction available and Timeout performed Patient Re-evaluated:Patient Re-evaluated prior to induction Oxygen Delivery Method: Nasal cannula Induction Type: IV induction Comments: Nasal cannula with ETCO2 monitoring placed under pt's CPAP mask. CPAP on prior to induction.

## 2018-02-08 NOTE — H&P (Signed)
Mario Silva [id number] 03-05-1966     HPI:  52 y.o male for screening colonoscopy. Tolerated prep well with minimal nausea. Reported it took longer than last time for BM's to start. Clear at present.   Medications Prior to Admission  Medication Sig Dispense Refill Last Dose  . ARIPiprazole (ABILIFY) 5 MG tablet TAKE 1 TABLET BY MOUTH EVERYDAY AT BEDTIME 90 tablet 3 Taking  . cholecalciferol (VITAMIN D) 1000 units tablet Take 1,000 Units by mouth daily.   Taking  . hydrocortisone 2.5 % cream APPLY THIN LAYER TO FACE TWICE DAILY AS NEEDED FOR 1 TO 2 WEEKS  2 Taking  . polycarbophil (FIBERCON) 625 MG tablet Take 625 mg by mouth daily.   Taking  . Thiamine HCl (VITAMIN B-1) 250 MG tablet Take 250 mg by mouth daily.   Taking   Allergies  Allergen Reactions  . Penicillins   . Latex Other (See Comments) and Rash    blister  . Latex Rash   Past Medical History:  Diagnosis Date  . Acid reflux   . Depression   . Mental retardation   . Restless leg syndrome   . Sleep apnea    Past Surgical History:  Procedure Laterality Date  . COLONOSCOPY    . diviated septum    . NOSE SURGERY     Social History   Socioeconomic History  . Marital status: Single    Spouse name: Not on file  . Number of children: Not on file  . Years of education: Not on file  . Highest education level: Not on file  Occupational History  . Not on file  Social Needs  . Financial resource strain: Not on file  . Food insecurity:    Worry: Not on file    Inability: Not on file  . Transportation needs:    Medical: Not on file    Non-medical: Not on file  Tobacco Use  . Smoking status: Never Smoker  . Smokeless tobacco: Never Used  Substance and Sexual Activity  . Alcohol use: No  . Drug use: No  . Sexual activity: Not on file  Lifestyle  . Physical activity:    Days per week: Not on file    Minutes per session: Not on file  . Stress: Not on file  Relationships  . Social connections:    Talks on  phone: Not on file    Gets together: Not on file    Attends religious service: Not on file    Active member of club or organization: Not on file    Attends meetings of clubs or organizations: Not on file    Relationship status: Not on file  . Intimate partner violence:    Fear of current or ex partner: Not on file    Emotionally abused: Not on file    Physically abused: Not on file    Forced sexual activity: Not on file  Other Topics Concern  . Not on file  Social History Narrative   ** Merged History Encounter **       Social History   Social History Narrative   ** Merged History Encounter **         ROS: Negative.     PE: HEENT: Negative. Lungs: Clear. Cardio: RR.  Assessment/Plan:  Proceed with planned endoscopy. Forest Gleason [facility] 02/08/2018

## 2018-02-13 ENCOUNTER — Encounter: Payer: Self-pay | Admitting: General Surgery

## 2018-03-30 ENCOUNTER — Ambulatory Visit: Payer: Self-pay | Admitting: Internal Medicine

## 2018-04-26 DIAGNOSIS — F819 Developmental disorder of scholastic skills, unspecified: Secondary | ICD-10-CM | POA: Insufficient documentation

## 2018-04-26 DIAGNOSIS — F3341 Major depressive disorder, recurrent, in partial remission: Secondary | ICD-10-CM | POA: Insufficient documentation

## 2018-04-26 DIAGNOSIS — R6250 Unspecified lack of expected normal physiological development in childhood: Secondary | ICD-10-CM | POA: Insufficient documentation

## 2018-04-26 DIAGNOSIS — I872 Venous insufficiency (chronic) (peripheral): Secondary | ICD-10-CM | POA: Insufficient documentation

## 2019-01-29 ENCOUNTER — Ambulatory Visit (INDEPENDENT_AMBULATORY_CARE_PROVIDER_SITE_OTHER): Payer: Medicare Other | Admitting: Podiatry

## 2019-01-29 ENCOUNTER — Encounter: Payer: Self-pay | Admitting: Podiatry

## 2019-01-29 ENCOUNTER — Other Ambulatory Visit: Payer: Self-pay

## 2019-01-29 ENCOUNTER — Ambulatory Visit: Payer: Medicare Other

## 2019-01-29 VITALS — Temp 97.9°F

## 2019-01-29 DIAGNOSIS — M2042 Other hammer toe(s) (acquired), left foot: Secondary | ICD-10-CM

## 2019-01-29 NOTE — Progress Notes (Signed)
  Subjective:  Patient ID: Mario Silva, male    DOB: Sep 06, 1965,  MRN: [id number] HPI Chief Complaint  Patient presents with  . Callouses    Patient presents today for painful callouses tips of right 2nd,4th toes and left 2nd toe x months.  He states "it hurts when I wear shoes all day."  He has only taken Tylenol with relief    53 y.o. male presents with the above complaint.   ROS: Denies fever chills nausea vomiting muscle aches pains calf pain back pain chest pain shortness of breath.  Past Medical History:  Diagnosis Date  . Acid reflux   . Depression   . Mental retardation   . Restless leg syndrome   . Sleep apnea    Past Surgical History:  Procedure Laterality Date  . COLONOSCOPY    . COLONOSCOPY WITH PROPOFOL N/A 02/08/2018   Procedure: COLONOSCOPY WITH PROPOFOL;  Surgeon: Robert Bellow, MD;  Location: [facility] ENDOSCOPY;  Service: General;  Laterality: N/A;  . diviated septum    . NOSE SURGERY      Current Outpatient Medications:  .  Cholecalciferol (VITAMIN D3 PO), Take by mouth., Disp: , Rfl:  .  vitamin B-12 (CYANOCOBALAMIN) 100 MCG tablet, Take 100 mcg by mouth daily., Disp: , Rfl:  .  ARIPiprazole (ABILIFY) 5 MG tablet, TAKE 1 TABLET BY MOUTH EVERYDAY AT BEDTIME, Disp: 90 tablet, Rfl: 3 .  LORazepam (ATIVAN) 0.5 MG tablet, TAKE 1 TABLET BY MOUTH ONCE DAILY AS NEEDED FOR ANXIETY, Disp: , Rfl:  .  naproxen (NAPROSYN) 500 MG tablet, Take by mouth., Disp: , Rfl:   Allergies  Allergen Reactions  . Penicillins   . Latex Other (See Comments) and Rash    blister  . Latex Rash   Review of Systems Objective:   Vitals:   01/29/19 1523  Temp: 97.9 F (36.6 C)    General: Well developed, nourished, in no acute distress, alert and oriented x3   Dermatological: Skin is warm, dry and supple bilateral. Nails x 10 are well maintained; remaining integument appears unremarkable at this time. There are no open sores, no preulcerative lesions, no rash or signs of  infection present.  No open lesions or wounds distal clavi are noted associated with hammertoe deformity in shoe gear.  Vascular: Dorsalis Pedis artery and Posterior Tibial artery pedal pulses are 2/4 bilateral with immedate capillary fill time. Pedal hair growth present. No varicosities and no lower extremity edema present bilateral.   Neruologic: Grossly intact via light touch bilateral. Vibratory intact via tuning fork bilateral. Protective threshold with Semmes Wienstein monofilament intact to all pedal sites bilateral. Patellar and Achilles deep tendon reflexes 2+ bilateral. No Babinski or clonus noted bilateral.   Musculoskeletal: No gross boney pedal deformities bilateral. No pain, crepitus, or limitation noted with foot and ankle range of motion bilateral. Muscular strength 5/5 in all groups tested bilateral.  Severe rigid hammertoe deformity 2 through 5 bilateral.  Gait: Unassisted, Nonantalgic.    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  Plan: Debrided reactive hyperkeratotic tissue discussed surgical need.     Max T. [place], [place]

## 2019-02-23 ENCOUNTER — Ambulatory Visit (INDEPENDENT_AMBULATORY_CARE_PROVIDER_SITE_OTHER): Payer: Medicare Other | Admitting: Nurse Practitioner

## 2019-02-23 ENCOUNTER — Encounter: Payer: Self-pay | Admitting: Nurse Practitioner

## 2019-02-23 ENCOUNTER — Other Ambulatory Visit: Payer: Self-pay

## 2019-02-23 VITALS — BP 112/75 | HR 75 | Ht 74.0 in | Wt 296.8 lb

## 2019-02-23 DIAGNOSIS — E162 Hypoglycemia, unspecified: Secondary | ICD-10-CM

## 2019-02-23 DIAGNOSIS — Z23 Encounter for immunization: Secondary | ICD-10-CM | POA: Diagnosis not present

## 2019-02-23 DIAGNOSIS — Z7689 Persons encountering health services in other specified circumstances: Secondary | ICD-10-CM

## 2019-02-23 DIAGNOSIS — G629 Polyneuropathy, unspecified: Secondary | ICD-10-CM

## 2019-02-23 MED ORDER — GABAPENTIN 100 MG PO CAPS: 100.0000 mg | ORAL_CAPSULE | Freq: Two times a day (BID) | ORAL | 5 refills | Status: DC

## 2019-02-23 NOTE — Progress Notes (Signed)
Subjective:    Patient ID: Mario Silva, male    DOB: May 25, 1966, 53 y.o.   MRN: [id number]  Mario Silva is a 53 y.o. male presenting on 02/23/2019 for Establish Care   HPI Establish Care New Provider Pt last seen by PCP Dr. Ginette Pitman about 1 month ago.  Obtain records from [facility].  Hypoglycemia 2016-2017.  Last months ago.    Paresthesias bilateral feet  Patient is having pain in feet.  Patient has hammertoes recently evaluated by [facility] podiatry.  Problem has been present for over 1 year, instructed to wait another 6 months to address.  Patient has loss of feeling, tingling.  Improves at night when he lies down.   - Patient also has varicose veins, is concerned this is contributing.  Depression Patient has lorazepam 0.5 mg tab takes prn for overwhelming sadness.  Only takes this about 1-2 times per month.  Patient states sadness is getting better.  Walking helps some with the sadness - limited by foot pain. - Patient usually lives in a group home, but is currently living with his mother.   - Patient also has developmental delays/cognitive impairment  Depression screen [facility] 2/9 02/23/2019 09/22/2017  Decreased Interest 0 1  Down, Depressed, Hopeless 1 1  PHQ - 2 Score 1 2  Altered sleeping 3 -  Tired, decreased energy 0 -  Change in appetite 2 -  Feeling bad or failure about yourself  0 -  Trouble concentrating 0 -  Moving slowly or fidgety/restless 0 -  Suicidal thoughts 0 -  PHQ-9 Score 6 -  Difficult doing work/chores Not difficult at all -   GAD 7 : Generalized Anxiety Score 02/23/2019  Nervous, Anxious, on Edge 1  Control/stop worrying 0  Worry too much - different things 0  Trouble relaxing 0  Restless 0  Easily annoyed or irritable 1  Afraid - awful might happen 0  Total GAD 7 Score 2  Anxiety Difficulty Not difficult at all     Past Medical History:  Diagnosis Date  . Acid reflux   . Anxiety   . Depression   . Mental retardation   . Restless leg syndrome    . Sleep apnea    Past Surgical History:  Procedure Laterality Date  . COLONOSCOPY    . COLONOSCOPY WITH PROPOFOL N/A 02/08/2018   Procedure: COLONOSCOPY WITH PROPOFOL;  Surgeon: Robert Bellow, MD;  Location: [facility] ENDOSCOPY;  Service: General;  Laterality: N/A;  . diviated septum    . NOSE SURGERY     Social History   Socioeconomic History  . Marital status: Single    Spouse name: Not on file  . Number of children: Not on file  . Years of education: Not on file  . Highest education level: Not on file  Occupational History  . Not on file  Social Needs  . Financial resource strain: Not on file  . Food insecurity    Worry: Not on file    Inability: Not on file  . Transportation needs    Medical: Not on file    Non-medical: Not on file  Tobacco Use  . Smoking status: Never Smoker  . Smokeless tobacco: Never Used  Substance and Sexual Activity  . Alcohol use: No  . Drug use: No  . Sexual activity: Not on file  Lifestyle  . Physical activity    Days per week: Not on file    Minutes per session: Not on file  .  Stress: Not on file  Relationships  . Social Herbalist on phone: Not on file    Gets together: Not on file    Attends religious service: Not on file    Active member of club or organization: Not on file    Attends meetings of clubs or organizations: Not on file    Relationship status: Not on file  . Intimate partner violence    Fear of current or ex partner: Not on file    Emotionally abused: Not on file    Physically abused: Not on file    Forced sexual activity: Not on file  Other Topics Concern  . Not on file  Social History Narrative   ** Merged History Encounter **       Family History  Problem Relation Age of Onset  . Hypertension Mother   . Hyperlipidemia Mother   . Anxiety disorder Mother   . Thyroid disease Mother    Current Outpatient Medications on File Prior to Visit  Medication Sig  . ARIPiprazole (ABILIFY) 5 MG tablet  TAKE 1 TABLET BY MOUTH EVERYDAY AT BEDTIME  . b complex vitamins tablet Take 1 tablet by mouth daily.  . Calcium Polycarbophil (FIBER-CAPS PO) Take by mouth.  . Cholecalciferol (VITAMIN D3 PO) Take by mouth.  Marland Kitchen LORazepam (ATIVAN) 0.5 MG tablet TAKE 1 TABLET BY MOUTH ONCE DAILY AS NEEDED FOR ANXIETY  . Probiotic Product (PROBIOTIC DAILY PO) Take by mouth.  . vitamin B-12 (CYANOCOBALAMIN) 100 MCG tablet Take 100 mcg by mouth daily.  . naproxen (NAPROSYN) 500 MG tablet Take by mouth.   No current facility-administered medications on file prior to visit.     Review of Systems Per HPI unless specifically indicated above     Objective:    BP 112/75 (BP Location: Left Arm, Patient Position: Sitting, Cuff Size: Normal)   Pulse 75   Ht 6\' 2"  (1.88 m)   Wt 296 lb 12.8 oz (134.6 kg)   SpO2 97%   BMI 38.11 kg/m   Wt Readings from Last 3 Encounters:  02/23/19 296 lb 12.8 oz (134.6 kg)  02/08/18 272 lb (123.4 kg)  01/26/18 280 lb (127 kg)    Physical Exam Vitals signs reviewed.  Constitutional:      General: He is not in acute distress.    Appearance: He is well-developed. He is obese.  HENT:     Head: Normocephalic and atraumatic.  Cardiovascular:     Rate and Rhythm: Normal rate and regular rhythm.     Pulses:          Radial pulses are 2+ on the right side and 2+ on the left side.       Posterior tibial pulses are 1+ on the right side and 1+ on the left side.     Heart sounds: Normal heart sounds, S1 normal and S2 normal. No murmur. No friction rub. No gallop.   Pulmonary:     Effort: Pulmonary effort is normal. No respiratory distress.     Breath sounds: Normal breath sounds and air entry. No stridor. No wheezing or rhonchi.  Abdominal:     General: There is no distension.     Palpations: There is no mass.     Tenderness: There is no abdominal tenderness.     Hernia: No hernia is present.  Musculoskeletal:     Right lower leg: No edema.     Left lower leg: No edema.  Skin:  General: Skin is warm and dry.     Capillary Refill: Capillary refill takes less than 2 seconds.  Neurological:     Mental Status: He is alert and oriented to person, place, and time.     Sensory: Sensory deficit (Pt has loss of monofilament sensation on feet bilaterally except 5th toes) present.  Psychiatric:        Attention and Perception: Attention normal.        Mood and Affect: Mood and affect normal.        Behavior: Behavior normal. Behavior is cooperative.     Results for orders placed or performed in visit on 02/23/19  B12  Result Value Ref Range   Vitamin B-12 656 200 - 1,100 pg/mL  Hemoglobin A1c  Result Value Ref Range   Hgb A1c MFr Bld 5.3 <5.7 % of total Hgb   Mean Plasma Glucose 105 (calc)   eAG (mmol/L) 5.8 (calc)  Magnesium  Result Value Ref Range   Magnesium 2.0 1.5 - 2.5 mg/dL      Assessment & Plan:   Problem List Items Addressed This Visit    None    Visit Diagnoses    Neuropathy    -  Primary Paresthesias Patient with new paresthesias about 12 months ago.  No evaluation to date.  Patient has had discussion with prior PCP that could be related to B12 deficiency, but no check of lab.  Abnormal monofilament on exam suggests possible idiopathic cause.  Alcohol, DM not causes as these are not part of patient's history.    Plan: 1. Treat pain/numbness with gabapentin 100 mg bedtime x 7 days, then increase to 100 mg in am and pm. 2. Recommended consider neurology evaluation.  I do not recommend considering nerve conduction test at first visit due to patient cognitive limitations. 3. Educated mother than she can evaluate effectiveness of gabapentin if patient starts walking more. 4. Follow-up 3 months.   Relevant Medications   gabapentin (NEURONTIN) 100 MG capsule   Other Relevant Orders   B12 (Completed)   Magnesium (Completed)   Hypoglycemia     Patient with past hypoglycemic events.  Same presentation with hypoglycemia prior to DM dx for his family  member.  Lab today.  Follow-up prn.   Relevant Orders   Hemoglobin A1c (Completed)   Establish Care [facility].  [facility] are [facility] in [facility] be requested.  Past medical, family, and surgical history reviewed w/ pt.     Needs flu shot       Relevant Orders   Flu Vaccine QUAD 6+ mos PF IM (Fluarix Quad PF) (Completed)      Meds ordered this encounter  Medications  . gabapentin (NEURONTIN) 100 MG capsule    Sig: Take 1 capsule (100 mg total) by mouth 2 (two) times daily. Take only 1 tablet at bedtime for first 2 weeks.    Dispense:  60 capsule    Refill:  5    Order Specific Question:   Supervising Provider    Answer:   Olin Hauser [[id number]]     Follow up plan: Return in about 3 months (around 05/26/2019) for neuropathy.  Cassell Smiles, DNP, AGPCNP-BC Adult Gerontology Primary Care Nurse Practitioner [facility] 02/25/2019, 11:43 PM

## 2019-02-23 NOTE — Patient Instructions (Addendum)
Mario Silva,   Thank you for coming in to clinic today.  1. You have neuropathy in your feet.  There are many causes for this.  Sometimes it is not reversed.  2. START gabapentin 100 mg at bedtime.  After 2 weeks, start taking another dose in the morning.  - Take gabapentin 100 mg in morning and at bedtime.  Please schedule a follow-up appointment with Cassell Smiles, AGNP. Return in about 3 months (around 05/26/2019) for neuropathy.  If you have any other questions or concerns, please feel free to call the clinic or send a message through [facility]. You may also schedule an earlier appointment if necessary.  You will receive a survey after today's visit either digitally by e-mail or paper by [employer]. Your experiences and feedback matter to [place].  Please respond so we know how we are doing as we provide care for you.  Cassell Smiles, DNP, AGNP-BC Adult Gerontology Nurse Practitioner [facility], [facility]   Neuropathic Pain Neuropathic pain is pain caused by damage to the nerves that are responsible for certain sensations in your body (sensory nerves). The pain can be caused by:  Damage to the sensory nerves that send signals to your spinal cord and brain (peripheral nervous system).  Damage to the sensory nerves in your brain or spinal cord (central nervous system). Neuropathic pain can make you more sensitive to pain. Even a minor sensation can feel very painful. This is usually a long-term condition that can be difficult to treat. The type of pain differs from person to person. It may:  Start suddenly (acute), or it may develop slowly and last for a long time (chronic).  Come and go as damaged nerves heal, or it may stay at the same level for years.  Cause emotional distress, loss of sleep, and a lower quality of life. What are the causes? The most common cause of this condition is diabetes. Many other diseases and conditions can also cause neuropathic pain. Causes of  neuropathic pain can be classified as:  Toxic. This is caused by medicines and chemicals. The most common cause of toxic neuropathic pain is damage from cancer treatments (chemotherapy).  Metabolic. This can be caused by: ? Diabetes. This is the most common disease that damages the nerves. ? Lack of vitamin B from long-term alcohol abuse.  Traumatic. Any injury that cuts, crushes, or stretches a nerve can cause damage and pain. A common example is feeling pain after losing an arm or leg (phantom limb pain).  Compression-related. If a sensory nerve gets trapped or compressed for a long period of time, the blood supply to the nerve can be cut off.  Vascular. Many blood vessel diseases can cause neuropathic pain by decreasing blood supply and oxygen to nerves.  Autoimmune. This type of pain results from diseases in which the body's defense system (immune system) mistakenly attacks sensory nerves. Examples of autoimmune diseases that can cause neuropathic pain include lupus and multiple sclerosis.  Infectious. Many types of viral infections can damage sensory nerves and cause pain. Shingles infection is a common cause of this type of pain.  Inherited. Neuropathic pain can be a symptom of many diseases that are passed down through families (genetic). What increases the risk? You are more likely to develop this condition if:  You have diabetes.  You smoke.  You drink too much alcohol.  You are taking certain medicines, including medicines that kill cancer cells (chemotherapy) or that treat immune system  disorders. What are the signs or symptoms? The main symptom is pain. Neuropathic pain is often described as:  Burning.  Shock-like.  Stinging.  Hot or cold.  Itching. How is this diagnosed? No single test can diagnose neuropathic pain. It is diagnosed based on:  Physical exam and your symptoms. Your health care provider will ask you about your pain. You may be asked to use a pain  scale to describe how bad your pain is.  Tests. These may be done to see if you have a high sensitivity to pain and to help find the cause and location of any sensory nerve damage. They include: ? Nerve conduction studies to test how well nerve signals travel through your sensory nerves (electrodiagnostic testing). ? Stimulating your sensory nerves through electrodes on your skin and measuring the response in your spinal cord and brain (somatosensory evoked potential).  Imaging studies, such as: ? X-rays. ? CT scan. ? MRI. How is this treated? Treatment for neuropathic pain may change over time. You may need to try different treatment options or a combination of treatments. Some options include:  Treating the underlying cause of the neuropathy, such as diabetes, kidney disease, or vitamin deficiencies.  Stopping medicines that can cause neuropathy, such as chemotherapy.  Medicine to relieve pain. Medicines may include: ? Prescription or over-the-counter pain medicine. ? Anti-seizure medicine. ? Antidepressant medicines. ? Pain-relieving patches that are applied to painful areas of skin. ? A medicine to numb the area (local anesthetic), which can be injected as a nerve block.  Transcutaneous nerve stimulation. This uses electrical currents to block painful nerve signals. The treatment is painless.  Alternative treatments, such as: ? Acupuncture. ? Meditation. ? Massage. ? Physical therapy. ? Pain management programs. ? Counseling. Follow these instructions at home: Medicines   Take over-the-counter and prescription medicines only as told by your health care provider.  Do not drive or use heavy machinery while taking prescription pain medicine.  If you are taking prescription pain medicine, take actions to prevent or treat constipation. Your health care provider may recommend that you: ? Drink enough fluid to keep your urine pale yellow. ? Eat foods that are high in fiber,  such as fresh fruits and vegetables, whole grains, and beans. ? Limit foods that are high in fat and processed sugars, such as fried or sweet foods. ? Take an over-the-counter or prescription medicine for constipation. Lifestyle   Have a good support system at home.  Consider joining a chronic pain support group.  Do not use any products that contain nicotine or tobacco, such as cigarettes and e-cigarettes. If you need help quitting, ask your health care provider.  Do not drink alcohol. General instructions  Learn as much as you can about your condition.  Work closely with all your health care providers to find the treatment plan that works best for you.  Ask your health care provider what activities are safe for you.  Keep all follow-up visits as told by your health care provider. This is important. Contact a health care provider if:  Your pain treatments are not working.  You are having side effects from your medicines.  You are struggling with tiredness (fatigue), mood changes, depression, or anxiety. Summary  Neuropathic pain is pain caused by damage to the nerves that are responsible for certain sensations in your body (sensory nerves).  Neuropathic pain may come and go as damaged nerves heal, or it may stay at the same level for years.  Neuropathic  pain is usually a long-term condition that can be difficult to treat. Consider joining a chronic pain support group. This information is not intended to replace advice given to you by your health care provider. Make sure you discuss any questions you have with your health care provider. Document Released: 03/11/2004 Document Revised: 10/05/2018 Document Reviewed: 07/01/2017 Elsevier Patient Education  2020 [employer].

## 2019-02-24 LAB — MAGNESIUM: Magnesium: 2 mg/dL (ref 1.5–2.5)

## 2019-02-24 LAB — HEMOGLOBIN A1C
Hgb A1c MFr Bld: 5.3 % of total Hgb (ref <5.7)
Mean Plasma Glucose: 105 (calc)
eAG (mmol/L): 5.8 (calc)

## 2019-02-24 LAB — VITAMIN B12: Vitamin B-12: 656 pg/mL (ref 200–1100)

## 2019-02-25 ENCOUNTER — Encounter: Payer: Self-pay | Admitting: Nurse Practitioner

## 2019-02-26 ENCOUNTER — Telehealth: Payer: Self-pay

## 2019-02-26 DIAGNOSIS — G629 Polyneuropathy, unspecified: Secondary | ICD-10-CM

## 2019-02-26 DIAGNOSIS — R202 Paresthesia of skin: Secondary | ICD-10-CM

## 2019-02-26 NOTE — Telephone Encounter (Signed)
The pt mother states that the gabapentin is making the patient really tired. She want to know if he can try a half tablet at bedtime.

## 2019-02-26 NOTE — Telephone Encounter (Signed)
STOP gabapentin due to drowsiness.  Could consider Lyrica, but may have same effect.  Patient's mother states they will go ahead and START with referral to neurology.  - START magnesium oxide 800 mg tab.  Take 1/2 tab daily and increase to 400 bid or 800 mg once daily if tolerated.  Patient already has trouble with diarrhea.

## 2019-05-30 ENCOUNTER — Telehealth: Payer: Self-pay | Admitting: Nurse Practitioner

## 2019-05-30 DIAGNOSIS — F3341 Major depressive disorder, recurrent, in partial remission: Secondary | ICD-10-CM

## 2019-05-30 MED ORDER — ARIPIPRAZOLE 5 MG PO TABS: ORAL_TABLET | ORAL | 0 refills | Status: DC

## 2019-05-30 NOTE — Telephone Encounter (Signed)
Pt  Mother called requesting refill on   Aripiprazole 5 MG called into  [facility]

## 2019-06-25 ENCOUNTER — Emergency Department: Payer: Medicare Other

## 2019-06-25 ENCOUNTER — Encounter: Payer: Self-pay | Admitting: Emergency Medicine

## 2019-06-25 ENCOUNTER — Emergency Department
Admission: EM | Admit: 2019-06-25 | Discharge: 2019-06-25 | Disposition: A | Discharge status: Home / Self Care | Payer: Medicare Other | Attending: Emergency Medicine | Admitting: Emergency Medicine

## 2019-06-25 ENCOUNTER — Other Ambulatory Visit: Payer: Self-pay

## 2019-06-25 DIAGNOSIS — Z79899 Other long term (current) drug therapy: Secondary | ICD-10-CM | POA: Insufficient documentation

## 2019-06-25 DIAGNOSIS — R42 Dizziness and giddiness: Secondary | ICD-10-CM | POA: Diagnosis not present

## 2019-06-25 DIAGNOSIS — Z91040 Latex allergy status: Secondary | ICD-10-CM | POA: Insufficient documentation

## 2019-06-25 DIAGNOSIS — R1013 Epigastric pain: Secondary | ICD-10-CM | POA: Diagnosis not present

## 2019-06-25 LAB — CBC
HCT: 50.3 % (ref 39.0–52.0)
Hemoglobin: 16.4 g/dL (ref 13.0–17.0)
MCH: 28.6 pg (ref 26.0–34.0)
MCHC: 32.6 g/dL (ref 30.0–36.0)
MCV: 87.8 fL (ref 80.0–100.0)
Platelets: 245 10*3/uL (ref 150–400)
RBC: 5.73 MIL/uL (ref 4.22–5.81)
RDW: 13 % (ref 11.5–15.5)
WBC: 9.5 10*3/uL (ref 4.0–10.5)
nRBC: 0 % (ref 0.0–0.2)

## 2019-06-25 LAB — BASIC METABOLIC PANEL
Anion gap: 10 (ref 5–15)
BUN: 11 mg/dL (ref 6–20)
CO2: 27 mmol/L (ref 22–32)
Calcium: 9.6 mg/dL (ref 8.9–10.3)
Chloride: 105 mmol/L (ref 98–111)
Creatinine, Ser: 0.75 mg/dL (ref 0.61–1.24)
GFR calc Af Amer: >60 mL/min (ref >60)
GFR calc non Af Amer: >60 mL/min (ref >60)
Glucose, Bld: 103 mg/dL — ABNORMAL HIGH (ref 70–99)
Potassium: 3.4 mmol/L — ABNORMAL LOW (ref 3.5–5.1)
Sodium: 142 mmol/L (ref 135–145)

## 2019-06-25 LAB — TROPONIN I (HIGH SENSITIVITY)
Troponin I (High Sensitivity): 5 ng/L (ref <18)
Troponin I (High Sensitivity): 5 ng/L (ref <18)

## 2019-06-25 MED ORDER — ALUM & MAG HYDROXIDE-SIMETH 200-200-20 MG/5ML PO SUSP
30.0000 mL | Freq: Once | ORAL | Status: AC
  Administered 2019-06-25: 30 mL via ORAL
  Filled 2019-06-25: qty 30

## 2019-06-25 MED ORDER — SODIUM CHLORIDE 0.9% FLUSH: 3.0000 mL | Freq: Once | INTRAVENOUS | Status: DC

## 2019-06-25 MED ORDER — MECLIZINE HCL 25 MG PO TABS: 25.0000 mg | ORAL_TABLET | Freq: Three times a day (TID) | ORAL | 0 refills | Status: DC | PRN

## 2019-06-25 MED ORDER — PANTOPRAZOLE SODIUM 40 MG PO TBEC: 40.0000 mg | DELAYED_RELEASE_TABLET | Freq: Every day | ORAL | 0 refills | Status: DC

## 2019-06-25 MED ORDER — LIDOCAINE VISCOUS HCL 2 % MT SOLN
15.0000 mL | Freq: Once | OROMUCOSAL | Status: AC
  Administered 2019-06-25: 15 mL via ORAL
  Filled 2019-06-25: qty 15

## 2019-06-25 MED ORDER — MECLIZINE HCL 25 MG PO TABS
25.0000 mg | ORAL_TABLET | Freq: Once | ORAL | Status: AC
  Administered 2019-06-25: 25 mg via ORAL
  Filled 2019-06-25: qty 1

## 2019-06-25 NOTE — ED Triage Notes (Addendum)
Dizzy episode 2 days ago and this am and afternoon. Diarrhea 2 days ago. During triage when questioned if has pain points to lower sternum.

## 2019-06-25 NOTE — ED Provider Notes (Signed)
[facility] Emergency Department Provider Note  ____________________________________________   First MD Initiated Contact with Patient 06/25/19 1831     (approximate)  I have reviewed the triage vital signs and the nursing notes.   HISTORY  Chief Complaint Dizziness and Chest Pain    HPI Mario Silva is a 53 y.o. male  With h/o depression, MR, prior dx of vertigo here with dizziness. History is provided with assistance of mother. Per her report, main complaint today is intermittent episodes of dizziness. Three times over the past several days, pt has had episode in which he is turning his head and experiences sensation of room spinning dizziness along with nausea. No vomiting. No vision changes. Sx improve when he is still. He has had similar sx in past but usually just happen once or twice then go away. No focal numbness or weakness.  Pt also c/o mild indigestion/upper abd pain. Pain is worse w/ eating and lying flat. No alleviating factors. No SOB, diaphoresis. Happened after eating Christmas dinner.        Past Medical History:  Diagnosis Date  . Acid reflux   . Anxiety   . Depression   . Mental retardation   . Restless leg syndrome   . Sleep apnea     Patient Active Problem List   Diagnosis Date Noted  . Development delay 04/26/2018  . Recurrent major depressive disorder, in partial remission ([place]) 04/26/2018  . Venous stasis dermatitis of both lower extremities 04/26/2018  . Encounter for screening colonoscopy 01/27/2018  . Sleep apnea 09/22/2017  . Rectal bleeding 03/18/2017    Past Surgical History:  Procedure Laterality Date  . COLONOSCOPY    . COLONOSCOPY WITH PROPOFOL N/A 02/08/2018   Procedure: COLONOSCOPY WITH PROPOFOL;  Surgeon: Robert Bellow, MD;  Location: [facility] ENDOSCOPY;  Service: General;  Laterality: N/A;  . diviated septum    . NOSE SURGERY      Prior to Admission medications   Medication Sig Start Date End Date  Taking? Authorizing Provider  ARIPiprazole (ABILIFY) 5 MG tablet TAKE 1 TABLET BY MOUTH EVERYDAY AT BEDTIME 05/30/19   Karamalegos, Devonne Doughty, DO  b complex vitamins tablet Take 1 tablet by mouth daily.    [provider]  Calcium Polycarbophil (FIBER-CAPS PO) Take by mouth.    [provider]  Cholecalciferol (VITAMIN D3 PO) Take by mouth.    [provider]  gabapentin (NEURONTIN) 100 MG capsule Take 1 capsule (100 mg total) by mouth 2 (two) times daily. Take only 1 tablet at bedtime for first 2 weeks. 02/23/19   Mikey College, NP  LORazepam (ATIVAN) 0.5 MG tablet TAKE 1 TABLET BY MOUTH ONCE DAILY AS NEEDED FOR ANXIETY 01/15/19   [provider]  meclizine (ANTIVERT) 25 MG tablet Take 1 tablet (25 mg total) by mouth 3 (three) times daily as needed for dizziness. 06/25/19   Duffy Bruce, MD  naproxen (NAPROSYN) 500 MG tablet Take by mouth.    [provider]  pantoprazole (PROTONIX) 40 MG tablet Take 1 tablet (40 mg total) by mouth daily for 14 days. 06/25/19 07/09/19  Duffy Bruce, MD  Probiotic Product (PROBIOTIC DAILY PO) Take by mouth.    [provider]  vitamin B-12 (CYANOCOBALAMIN) 100 MCG tablet Take 100 mcg by mouth daily.    [provider]    Allergies Penicillins, Latex, and Latex  Family History  Problem Relation Age of Onset  . Hypertension Mother   . Hyperlipidemia  Mother   . Anxiety disorder Mother   . Thyroid disease Mother     Social History Social History   Tobacco Use  . Smoking status: Never Smoker  . Smokeless tobacco: Never Used  Substance Use Topics  . Alcohol use: No  . Drug use: No    Review of Systems  Review of Systems  Constitutional: Positive for fatigue. Negative for chills and fever.  HENT: Negative for sore throat.   Respiratory: Negative for shortness of breath.   Cardiovascular: Positive for chest pain.  Gastrointestinal: Positive for nausea. Negative for abdominal  pain.  Genitourinary: Negative for flank pain.  Musculoskeletal: Negative for neck pain.  Skin: Negative for rash and wound.  Allergic/Immunologic: Negative for immunocompromised state.  Neurological: Positive for dizziness and weakness. Negative for numbness.  Hematological: Does not bruise/bleed easily.  All other systems reviewed and are negative.    ____________________________________________  PHYSICAL EXAM:      VITAL SIGNS: ED Triage Vitals  Enc Vitals Group     BP 06/25/19 1527 111/80     Pulse Rate 06/25/19 1527 91     Resp 06/25/19 1527 20     Temp 06/25/19 1527 98.9 F (37.2 C)     Temp Source 06/25/19 1527 Oral     SpO2 06/25/19 1527 96 %     Weight 06/25/19 1530 300 lb (136.1 kg)     Height 06/25/19 1530 6\' 4"  (1.93 m)     Head Circumference --      Peak Flow --      Pain Score --      Pain Loc --      Pain Edu? --      Excl. in [place]? --      Physical Exam Vitals and nursing note reviewed.  Constitutional:      General: He is not in acute distress.    Appearance: He is well-developed.  HENT:     Head: Normocephalic and atraumatic.     Comments: Serous effusions bilateral TMs. No erythema. No mastoid TTP. Eyes:     Conjunctiva/sclera: Conjunctivae normal.  Cardiovascular:     Rate and Rhythm: Normal rate and regular rhythm.     Heart sounds: Normal heart sounds. No murmur. No friction rub.  Pulmonary:     Effort: Pulmonary effort is normal. No respiratory distress.     Breath sounds: Normal breath sounds. No wheezing or rales.  Abdominal:     General: There is no distension.     Palpations: Abdomen is soft.     Tenderness: There is no abdominal tenderness.  Musculoskeletal:     Cervical back: Neck supple.  Skin:    General: Skin is warm.     Capillary Refill: Capillary refill takes less than 2 seconds.  Neurological:     Mental Status: He is alert and oriented to person, place, and time.     Motor: No abnormal muscle tone.     Comments: Marked  disconjugate gaze 2/2 strabismus, which is chronic. Rightward, unidirectional, horizontal nystagmus that is reproduced on Dix-Hallpike. Face is symmetric. Tongue protrusion midline. Strength 5/5 bilateral UE and LE. Sensation to light touch intact b/l UE and LE.        ____________________________________________   LABS (all labs ordered are listed, but only abnormal results are displayed)  Labs Reviewed  BASIC METABOLIC PANEL - Abnormal; Notable for the following components:      Result Value   Potassium 3.4 (*)    Glucose,  Bld 103 (*)    All other components within normal limits  CBC  TROPONIN I (HIGH SENSITIVITY)  TROPONIN I (HIGH SENSITIVITY)    ____________________________________________  EKG: Normal sinus rhythm, VR 85. PR 130, QRS 100, QTc 418. No acute ST elevations or depressions. ________________________________________  RADIOLOGY All imaging, including plain films, CT scans, and ultrasounds, independently reviewed by me, and interpretations confirmed via formal radiology reads.  ED MD interpretation:   CXR: No active disease  Official radiology report(s): DG Chest 2 View  Result Date: 06/25/2019 CLINICAL DATA:  Chest pain EXAM: CHEST - 2 VIEW COMPARISON:  07/08/2013 FINDINGS: The heart size and mediastinal contours are within normal limits. Both lungs are clear. The visualized skeletal structures are unremarkable. IMPRESSION: No active cardiopulmonary disease. Electronically Signed   By: Franchot Gallo M.D.   On: 06/25/2019 16:06   CT Head Wo Contrast  Result Date: 06/25/2019 CLINICAL DATA:  Dizziness diarrhea EXAM: CT HEAD WITHOUT CONTRAST TECHNIQUE: Contiguous axial images were obtained from the base of the skull through the vertex without intravenous contrast. COMPARISON:  11/15/2008 CT FINDINGS: Brain: No evidence of acute infarction, hemorrhage, hydrocephalus, extra-axial collection or mass lesion/mass effect. Vascular: No hyperdense vessel or unexpected  calcification. Skull: Normal. Negative for fracture or focal lesion. Sinuses/Orbits: Mild mucosal thickening in the sinuses Other: None IMPRESSION: Negative non contrasted CT appearance of the brain. Electronically Signed   By: Donavan Foil M.D.   On: 06/25/2019 20:13    ____________________________________________  PROCEDURES   Procedure(s) performed (including Critical Care):  Procedures  ____________________________________________  INITIAL IMPRESSION / MDM / [place] / ED COURSE  As part of my medical decision making, I reviewed the following data within the [place] notes reviewed and incorporated, Old chart reviewed, Notes from prior ED visits, and [place] Controlled Substance Database       *Levelle Balsamo Beilke was evaluated in Emergency Department on 06/25/2019 for the symptoms described in the history of present illness. He was evaluated in the context of the global COVID-19 pandemic, which necessitated consideration that the patient might be at risk for infection with the SARS-CoV-2 virus that causes COVID-19. Institutional protocols and algorithms that pertain to the evaluation of patients at risk for COVID-19 are in a state of rapid change based on information released by regulatory bodies including the CDC and federal and state organizations. These policies and algorithms were followed during the patient's care in the ED.  Some ED evaluations and interventions may be delayed as a result of limited staffing during the pandemic.*     Medical Decision Making:  53 yo M here with episodes of vertigo. Vertigo is reproducible on Dix-Hallpike, unidirectional, horizontal, and positional, with no accompanying dysmetria, weakness, or signs to suggest central etiology. Suspect BPPV. He has been worked up and seen for this in the past as well. CT Head is neg for bleed, edema, or significant mass. He feels better w/ Epley maneuver and Meclizine. Will refer to  ENT.  Regarding his epigastric discomfort, this began after eating a large meal, is positional, and likely GI related. Normal EKG with neg trop despite sx for days - doubt ACS or ischemia. Pain is not c/w dissection or PE. No RUQ TTP or sx to suggest cholecystitis.  ____________________________________________  FINAL CLINICAL IMPRESSION(S) / ED DIAGNOSES  Final diagnoses:  Vertigo     MEDICATIONS GIVEN DURING THIS VISIT:  Medications  sodium chloride flush (NS) 0.9 % injection 3 mL (has no administration  in time range)  meclizine (ANTIVERT) tablet 25 mg (25 mg Oral Given 06/25/19 1937)  alum & mag hydroxide-simeth (MAALOX/MYLANTA) 200-200-20 MG/5ML suspension 30 mL (30 mLs Oral Given 06/25/19 2011)    And  lidocaine (XYLOCAINE) 2 % viscous mouth solution 15 mL (15 mLs Oral Given 06/25/19 2011)     ED Discharge Orders         Ordered    meclizine (ANTIVERT) 25 MG tablet  3 times daily PRN     06/25/19 2023    pantoprazole (PROTONIX) 40 MG tablet  Daily     06/25/19 2023           Note:  This document was prepared using Dragon voice recognition software and may include unintentional dictation errors.   Duffy Bruce, MD 06/25/19 2227

## 2019-06-25 NOTE — ED Notes (Signed)
See triage note  Presents with some dizziness which started 2 days ago

## 2019-06-25 NOTE — Discharge Instructions (Addendum)
Take the antacid (Pantoprazole) as prescribed. Take this first thing in the morning, 30 minutes before eating.  Take the Meclizine as needed for dizziness.  Read the paper I have provided about how to try an Epley maneuver to fix your vertigo

## 2019-07-03 ENCOUNTER — Encounter: Payer: Self-pay | Admitting: Family Medicine

## 2019-07-03 ENCOUNTER — Ambulatory Visit (INDEPENDENT_AMBULATORY_CARE_PROVIDER_SITE_OTHER): Payer: Medicare Other | Admitting: Family Medicine

## 2019-07-03 ENCOUNTER — Other Ambulatory Visit: Payer: Self-pay

## 2019-07-03 VITALS — BP 103/68 | HR 72 | Temp 97.5°F | Resp 16 | Ht 74.0 in | Wt 297.0 lb

## 2019-07-03 DIAGNOSIS — F419 Anxiety disorder, unspecified: Secondary | ICD-10-CM | POA: Diagnosis not present

## 2019-07-03 DIAGNOSIS — K219 Gastro-esophageal reflux disease without esophagitis: Secondary | ICD-10-CM | POA: Diagnosis not present

## 2019-07-03 DIAGNOSIS — F3341 Major depressive disorder, recurrent, in partial remission: Secondary | ICD-10-CM

## 2019-07-03 DIAGNOSIS — H8110 Benign paroxysmal vertigo, unspecified ear: Secondary | ICD-10-CM | POA: Diagnosis not present

## 2019-07-03 MED ORDER — PANTOPRAZOLE SODIUM 40 MG PO TBEC: 40.0000 mg | DELAYED_RELEASE_TABLET | Freq: Every day | ORAL | 2 refills | Status: DC

## 2019-07-03 MED ORDER — LORAZEPAM 0.5 MG PO TABS: 0.5000 mg | ORAL_TABLET | Freq: Every day | ORAL | 2 refills | Status: DC | PRN

## 2019-07-03 NOTE — Patient Instructions (Addendum)
Thank you for coming to the office today.  Refilled Lorazepam. Continue Abilify.  Take Pantoprazole 40mg  daily before breakfast for up to 6 weeks then can taper down, every other day or add OTC Omeprazole 20mg  to lower.  1. You have symptoms of Vertigo (Benign Paroxysmal Positional Vertigo) - This is commonly caused by inner ear fluid imbalance, sometimes can be worsened by allergies and sinus symptoms, otherwise it can occur randomly sometimes and we may never discover the exact cause. - To treat this, try the Epley Manuever (see diagrams/instructions below) at home up to 3 times a day for 1-2 weeks or until symptoms resolve - You may take Meclizine as needed up to 3 times a day for dizziness, this will not cure symptoms but may help. Caution may make you drowsy.  If you develop significant worsening episode with vertigo that does not improve and you get severe headache, loss of vision, arm or leg weakness, slurred speech, or other concerning symptoms please seek immediate medical attention at Emergency Department.  Please schedule a follow-up appointment with Dr Parks Ranger within 4 weeks if Vertigo not improving, and will consider Referral to Vestibular Rehab  See the next page for images describing the Epley Manuever.     ----------------------------------------------------------------------------------------------------------------------          Please schedule a Follow-up Appointment to: Return in about 2 months (around 08/31/2019), or if symptoms worsen or fail to improve, for anxiety / GERD follow-up with new provider.  If you have any other questions or concerns, please feel free to call the office or send a message through [place]. You may also schedule an earlier appointment if necessary.  Additionally, you may be receiving a survey about your experience at our office within a few days to 1 week by e-mail or mail. We value your feedback.  Nobie Putnam, DO [place]

## 2019-07-03 NOTE — Progress Notes (Signed)
Subjective:    Patient ID: Mario Silva, male    DOB: 15-Aug-1965, 54 y.o.   MRN: [id number]  Mario Silva is a 54 y.o. male presenting on 07/03/2019 for Hospitalization Follow-up (dizziness)   HPI  Accompanied by mother, Careers information officer. Who provides additional history.  ED FOLLOW-UP VISIT  Hospital/Location: [facility] Date of ED Visit: 06/25/19  Reason for Presenting to ED: Dizziness Primary (+Secondary) Diagnosis: Vertigo  FOLLOW-UP  - ED provider note and record have been reviewed - Patient presents today about 8 days after recent ED visit. Brief summary of recent course, patient had symptoms of dizziness, episodes vertigo, and chest pain presented to ED on 12/28, testing in ED with labs EKG, troponin negative, BMET showed mild low K 3.4, CXR and CT Head, treated with meclizine and GI cocktail due to upper abdominal indigestion vs chest pain. Negative cardiac work up, improved after medicine and epley maneuver, discharged to home after improved PO intake.  - Today reports overall has done well after discharge from ED. Symptoms of vertigo and dizziness have mostly resolved, has tried [employer] but not doing regularly, tried Meclizine PRN 1-2 times with good results.  - Next issue with GERD - flare up indigestion upper abdominal vs chest pain, now dramatically improved after 3 days on PPI Pantoprazole 40mg  daily. They have 2 week supply asking for refill.  Additional chronic issues  Major Depression recurrent / Anxiety He has not seen psychiatry. He takes Abilify 5mg  daily and Lorazepam 0.5mg  PRN. Previous PCP was agreeable to continuing this regimen. He rarely takes Lorazepam, request refill now since it has been almost 6 month since last. Recent health issue made him more anxious. Also asking about abilify dosing for his mood.  Taking Magnesium 400mg  daily now as well for neuropathy.  Depression screen [facility] 2/9 07/03/2019 02/23/2019 09/22/2017  Decreased Interest 1 0 1  Down,  Depressed, Hopeless 0 1 1  PHQ - 2 Score 1 1 2   Altered sleeping 1 3 -  Tired, decreased energy 1 0 -  Change in appetite 0 2 -  Feeling bad or failure about yourself  0 0 -  Trouble concentrating 1 0 -  Moving slowly or fidgety/restless 0 0 -  Suicidal thoughts 0 0 -  PHQ-9 Score 4 6 -  Difficult doing work/chores Not difficult at all Not difficult at all -    Social History   Tobacco Use  . Smoking status: Never Smoker  . Smokeless tobacco: Never Used  Substance Use Topics  . Alcohol use: No  . Drug use: No    Depression screen [facility] 2/9 07/03/2019 02/23/2019 09/22/2017  Decreased Interest 1 0 1  Down, Depressed, Hopeless 0 1 1  PHQ - 2 Score 1 1 2   Altered sleeping 1 3 -  Tired, decreased energy 1 0 -  Change in appetite 0 2 -  Feeling bad or failure about yourself  0 0 -  Trouble concentrating 1 0 -  Moving slowly or fidgety/restless 0 0 -  Suicidal thoughts 0 0 -  PHQ-9 Score 4 6 -  Difficult doing work/chores Not difficult at all Not difficult at all -   GAD 7 : Generalized Anxiety Score 02/23/2019  Nervous, Anxious, on Edge 1  Control/stop worrying 0  Worry too much - different things 0  Trouble relaxing 0  Restless 0  Easily annoyed or irritable 1  Afraid - awful might happen 0  Total GAD 7 Score 2  Anxiety  Difficulty Not difficult at all     Review of Systems Per HPI unless specifically indicated above     Objective:    BP 103/68   Pulse 72   Temp (!) 97.5 F (36.4 C) (Oral)   Resp 16   Ht 6\' 2"  (1.88 m)   Wt 297 lb (134.7 kg)   BMI 38.13 kg/m   Wt Readings from Last 3 Encounters:  07/03/19 297 lb (134.7 kg)  06/25/19 300 lb (136.1 kg)  02/23/19 296 lb 12.8 oz (134.6 kg)    Physical Exam Vitals and nursing note reviewed.  Constitutional:      General: He is not in acute distress.    Appearance: He is well-developed. He is not diaphoretic.     Comments: Well-appearing, comfortable, cooperative  HENT:     Head: Normocephalic and atraumatic.   Eyes:     General:        Right eye: No discharge.        Left eye: No discharge.     Conjunctiva/sclera: Conjunctivae normal.  Cardiovascular:     Rate and Rhythm: Normal rate.  Pulmonary:     Effort: Pulmonary effort is normal.  Skin:    General: Skin is warm and dry.     Findings: No erythema or rash.  Neurological:     Mental Status: He is alert and oriented to person, place, and time.  Psychiatric:        Behavior: Behavior normal.     Comments: Well groomed, good eye contact, normal speech and thoughts      I have personally reviewed the radiology report from 06/25/19 Chest X-ray.  DG Chest 2 ViewPerformed 06/25/2019 Final result  Study Result CLINICAL DATA: Chest pain  EXAM: CHEST - 2 VIEW  COMPARISON: 07/08/2013  FINDINGS: The heart size and mediastinal contours are within normal limits. Both lungs are clear. The visualized skeletal structures are unremarkable.  IMPRESSION: No active cardiopulmonary disease.   Electronically Signed By: Franchot Gallo M.D. On: 06/25/2019 16:06  ------  I have personally reviewed the radiology report from 06/25/19 CT Head.   CT Head Wo ContrastPerformed 06/25/2019 Final result  Study Result CLINICAL DATA: Dizziness diarrhea  EXAM: CT HEAD WITHOUT CONTRAST  TECHNIQUE: Contiguous axial images were obtained from the base of the skull through the vertex without intravenous contrast.  COMPARISON: 11/15/2008 CT  FINDINGS: Brain: No evidence of acute infarction, hemorrhage, hydrocephalus, extra-axial collection or mass lesion/mass effect.  Vascular: No hyperdense vessel or unexpected calcification.  Skull: Normal. Negative for fracture or focal lesion.  Sinuses/Orbits: Mild mucosal thickening in the sinuses  Other: None  IMPRESSION: Negative non contrasted CT appearance of the brain.   Electronically Signed By: Donavan Foil M.D. On: 06/25/2019 20:13    Results for orders placed or performed during  the hospital encounter of [id number]  Basic metabolic panel  Result Value Ref Range   Sodium 142 135 - 145 mmol/L   Potassium 3.4 (L) 3.5 - 5.1 mmol/L   Chloride 105 98 - 111 mmol/L   CO2 27 22 - 32 mmol/L   Glucose, Bld 103 (H) 70 - 99 mg/dL   BUN 11 6 - 20 mg/dL   Creatinine, Ser 0.75 0.61 - 1.24 mg/dL   Calcium 9.6 8.9 - 10.3 mg/dL   GFR calc non Af Amer >60 >60 mL/min   GFR calc Af Amer >60 >60 mL/min   Anion gap 10 5 - 15  CBC  Result Value Ref Range  WBC 9.5 4.0 - 10.5 K/uL   RBC 5.73 4.22 - 5.81 MIL/uL   Hemoglobin 16.4 13.0 - 17.0 g/dL   HCT 50.3 39.0 - 52.0 %   MCV 87.8 80.0 - 100.0 fL   MCH 28.6 26.0 - 34.0 pg   MCHC 32.6 30.0 - 36.0 g/dL   RDW 13.0 11.5 - 15.5 %   Platelets 245 150 - 400 K/uL   nRBC 0.0 0.0 - 0.2 %  Troponin I (High Sensitivity)  Result Value Ref Range   Troponin I (High Sensitivity) 5 <18 ng/L  Troponin I (High Sensitivity)  Result Value Ref Range   Troponin I (High Sensitivity) 5 <18 ng/L      Assessment & Plan:   Problem List Items Addressed This Visit    Recurrent major depressive disorder, in partial remission (HCC)   Relevant Medications   LORazepam (ATIVAN) 0.5 MG tablet    Other Visit Diagnoses    Benign paroxysmal positional vertigo, unspecified laterality    -  Primary   Gastroesophageal reflux disease, unspecified whether esophagitis present       Relevant Medications   pantoprazole (PROTONIX) 40 MG tablet   magnesium oxide (MAG-OX) 400 MG tablet   Anxiety       Relevant Medications   LORazepam (ATIVAN) 0.5 MG tablet      #BPPV Improved, significantly now Rarely using home Epley maneuver - gave them new handout with instructions Use Meclizine PRN Should be self limited  #GERD Improved on PPI now for short course Refill Pantoprazole 40mg  daily - instructions for 6 week then taper to QOD and consider OTC omeprazole 20mg  if need longer  #Anxiety/Depression Chronic problems Not focus of visit today Reviewed PDMP, agree  to refill previous intermittent PRN Lorazepam 0.5mg  #30 with refill, discussed not ideal long term use, use only PRN Also continues on Abilify 5mg  daily, would require further discussion to determine if need dose adjustment or switch of med, I also recommended future may benefit from Psychiatry or other evaluation    Meds ordered this encounter  Medications  . pantoprazole (PROTONIX) 40 MG tablet    Sig: Take 1 tablet (40 mg total) by mouth daily before breakfast.    Dispense:  30 tablet    Refill:  2  . LORazepam (ATIVAN) 0.5 MG tablet    Sig: Take 1 tablet (0.5 mg total) by mouth daily as needed for anxiety.    Dispense:  30 tablet    Refill:  2    Follow up plan: Return in about 2 months (around 08/31/2019), or if symptoms worsen or fail to improve, for anxiety / GERD follow-up with new provider.   Nobie Putnam, [facility] 07/03/2019, 11:03 AM

## 2019-08-30 DIAGNOSIS — D485 Neoplasm of uncertain behavior of skin: Secondary | ICD-10-CM | POA: Diagnosis not present

## 2019-08-30 DIAGNOSIS — C44329 Squamous cell carcinoma of skin of other parts of face: Secondary | ICD-10-CM | POA: Diagnosis not present

## 2019-08-30 DIAGNOSIS — L570 Actinic keratosis: Secondary | ICD-10-CM | POA: Diagnosis not present

## 2019-08-30 DIAGNOSIS — B078 Other viral warts: Secondary | ICD-10-CM | POA: Diagnosis not present

## 2019-08-30 HISTORY — DX: Actinic keratosis: L57.0

## 2019-09-03 ENCOUNTER — Other Ambulatory Visit: Payer: Self-pay

## 2019-09-03 ENCOUNTER — Encounter: Payer: Self-pay | Admitting: Family Medicine

## 2019-09-03 ENCOUNTER — Ambulatory Visit (INDEPENDENT_AMBULATORY_CARE_PROVIDER_SITE_OTHER): Payer: Medicare Other | Admitting: Family Medicine

## 2019-09-03 VITALS — BP 120/74 | HR 69 | Temp 97.8°F | Resp 16 | Ht 74.0 in | Wt 302.0 lb

## 2019-09-03 DIAGNOSIS — H6123 Impacted cerumen, bilateral: Secondary | ICD-10-CM

## 2019-09-03 NOTE — Progress Notes (Signed)
Subjective:    Patient ID: Mario Silva, male    DOB: 1966/04/07, 54 y.o.   MRN: [id number]  Mario Silva is a 54 y.o. male presenting on 09/03/2019 for Ear Pain (Right side onset 2 days )  Patient is accompanied by mother today.  HPI   Bilateral Cerumen Impaction, reduced hearing / pain Reports new onset problem past 2 days with Left ear acute popping, some difficulty hearing out of Left ear, and some pain and pressure, worse at night. Both ears reduced hearing. - History of prior ENT at [facility], history of deviated septum surgery in past. - He has had one further dizziness episode and vertigo in past. They tried the [place] but could not.  - He goes back and forth from mother's home and group home. He does take Meclizine PRN with good results. - Followed by Dr Lanelle Bal Neuro for peripheral neuropathy had recent testing done Denies fever chills headache ear drainage or bleeding, sinusitis  Depression screen [facility] 2/9 07/03/2019 02/23/2019 09/22/2017  Decreased Interest 1 0 1  Down, Depressed, Hopeless 0 1 1  PHQ - 2 Score 1 1 2   Altered sleeping 1 3 -  Tired, decreased energy 1 0 -  Change in appetite 0 2 -  Feeling bad or failure about yourself  0 0 -  Trouble concentrating 1 0 -  Moving slowly or fidgety/restless 0 0 -  Suicidal thoughts 0 0 -  PHQ-9 Score 4 6 -  Difficult doing work/chores Not difficult at all Not difficult at all -    Social History   Tobacco Use  . Smoking status: Never Smoker  . Smokeless tobacco: Never Used  Substance Use Topics  . Alcohol use: No  . Drug use: No    Review of Systems Per HPI unless specifically indicated above     Objective:    BP 120/74   Pulse 69   Temp 97.8 F (36.6 C) (Temporal)   Resp 16   Ht 6\' 2"  (1.88 m)   Wt (!) 302 lb (137 kg)   SpO2 98%   BMI 38.77 kg/m   Wt Readings from Last 3 Encounters:  09/03/19 (!) 302 lb (137 kg)  07/03/19 297 lb (134.7 kg)  06/25/19 300 lb (136.1 kg)    Physical  Exam Vitals and nursing note reviewed.  Constitutional:      General: He is not in acute distress.    Appearance: He is well-developed. He is not diaphoretic.     Comments: Well-appearing, comfortable, cooperative  HENT:     Head: Normocephalic and atraumatic.     Comments: Impacted cerumen bilateral    Right Ear: External ear normal.     Left Ear: External ear normal.  Eyes:     General:        Right eye: No discharge.        Left eye: No discharge.     Conjunctiva/sclera: Conjunctivae normal.  Cardiovascular:     Rate and Rhythm: Normal rate.  Pulmonary:     Effort: Pulmonary effort is normal.  Skin:    General: Skin is warm and dry.     Findings: No erythema or rash.  Neurological:     Mental Status: He is alert and oriented to person, place, and time.  Psychiatric:        Behavior: Behavior normal.     Comments: Well groomed, good eye contact      ________________________________________________________ PROCEDURE NOTE  Date: 09/03/19 Bilateral Ear Lavage / Cerumen Removal Discussed benefits and risks (including pain / discomforts, dizziness, minor abrasion of ear canal). Verbal consent given by patient. Medication:  carbamide peroxide ear drops, Ear Lavage Solution (warm water + hydrogen peroxide) Performed by Dr Parks Ranger Several drops of carbamide peroxide placed in each ear, allowed to sit for few minutes. Ear lavage solution flushed into one ear at a time in attempt to dislodge and remove ear wax. Results were successful with removal of most cerumen and patient could then hear.  Repeat Ear Exam: - Completely removed cerumen now, with clear ear canals and visible TMs clear and normal appearance.    Results for orders placed or performed during the hospital encounter of [id number]  Basic metabolic panel  Result Value Ref Range   Sodium 142 135 - 145 mmol/L   Potassium 3.4 (L) 3.5 - 5.1 mmol/L   Chloride 105 98 - 111 mmol/L   CO2 27 22 - 32 mmol/L   Glucose, Bld  103 (H) 70 - 99 mg/dL   BUN 11 6 - 20 mg/dL   Creatinine, Ser 0.75 0.61 - 1.24 mg/dL   Calcium 9.6 8.9 - 10.3 mg/dL   GFR calc non Af Amer >60 >60 mL/min   GFR calc Af Amer >60 >60 mL/min   Anion gap 10 5 - 15  CBC  Result Value Ref Range   WBC 9.5 4.0 - 10.5 K/uL   RBC 5.73 4.22 - 5.81 MIL/uL   Hemoglobin 16.4 13.0 - 17.0 g/dL   HCT 50.3 39.0 - 52.0 %   MCV 87.8 80.0 - 100.0 fL   MCH 28.6 26.0 - 34.0 pg   MCHC 32.6 30.0 - 36.0 g/dL   RDW 13.0 11.5 - 15.5 %   Platelets 245 150 - 400 K/uL   nRBC 0.0 0.0 - 0.2 %  Troponin I (High Sensitivity)  Result Value Ref Range   Troponin I (High Sensitivity) 5 <18 ng/L  Troponin I (High Sensitivity)  Result Value Ref Range   Troponin I (High Sensitivity) 5 <18 ng/L      Assessment & Plan:   Problem List Items Addressed This Visit    None    Visit Diagnoses    Bilateral hearing loss due to cerumen impaction    -  Primary      Significant amount of large thick impacted cerumen bilaterally suspected primary cause of current reduced bilateral hearing and ear pain.  Plan: 1. Successful office ear lavage cerumen removal today, re-evaluated with clear ear canals and normal TMs 2. Counseled on avoiding Q-tips and may use Kleenex as wick, use OTC Debrox as needed 3. Follow-up as needed   No orders of the defined types were placed in this encounter.     Follow up plan: Return if symptoms worsen or fail to improve.    Nobie Putnam, [facility] 09/03/2019, 2:14 PM

## 2019-09-03 NOTE — Patient Instructions (Addendum)
You have thick impacted ear wax (cerumen) blocking ear canals and ear drums. This is the most likely cause of reduced hearing and ear pain and discomfort. - We were able to remove almost all of the ear wax with flushing in the office today  Recommend using the Debrox ear drops in future use on both sides following instructions on bottle, pharmacist will direct you to the appropriate ear drops if you need help. May take a week or more.  Avoid using Q-tips inside ears, as this can push wax deeper, but you can try to use rolled up kleenex as a wick to absorb fluid and wax as well.  If you are not making progress with ear wax removal at home, or the problem keeps coming back, please notify our office or return for re-evaluation, and we can discuss referral to ENT office for more formal ear wax removal.  [facility] [id number]  [place], [place] [address] Ph: [phone]    Please schedule a Follow-up Appointment to: Return if symptoms worsen or fail to improve.  If you have any other questions or concerns, please feel free to call the office or send a message through [place]. You may also schedule an earlier appointment if necessary.  Additionally, you may be receiving a survey about your experience at our office within a few days to 1 week by e-mail or mail. We value your feedback.  Nobie Putnam, DO [place]

## 2019-09-07 ENCOUNTER — Telehealth: Payer: Self-pay | Admitting: Nurse Practitioner

## 2019-09-07 ENCOUNTER — Other Ambulatory Visit: Payer: Self-pay | Admitting: Family Medicine

## 2019-09-07 DIAGNOSIS — L989 Disorder of the skin and subcutaneous tissue, unspecified: Secondary | ICD-10-CM

## 2019-09-07 NOTE — Progress Notes (Signed)
Mother requested patient be referred for second opinion for dermatology with skin lesions.  Provider preference received and sent in referral.

## 2019-09-07 NOTE — Telephone Encounter (Signed)
Pt mother is wanting a second opinion on dermatologist

## 2019-09-07 NOTE — Telephone Encounter (Signed)
Referral placed for second opinion after discussion with Mother.

## 2019-09-12 ENCOUNTER — Encounter: Payer: Self-pay | Admitting: Family Medicine

## 2019-09-12 ENCOUNTER — Ambulatory Visit (INDEPENDENT_AMBULATORY_CARE_PROVIDER_SITE_OTHER): Payer: Medicare Other | Admitting: Family Medicine

## 2019-09-12 ENCOUNTER — Other Ambulatory Visit: Payer: Self-pay

## 2019-09-12 VITALS — BP 115/74 | HR 78 | Temp 97.8°F | Ht 74.0 in | Wt 301.4 lb

## 2019-09-12 DIAGNOSIS — F3341 Major depressive disorder, recurrent, in partial remission: Secondary | ICD-10-CM

## 2019-09-12 DIAGNOSIS — F419 Anxiety disorder, unspecified: Secondary | ICD-10-CM

## 2019-09-12 DIAGNOSIS — Z6838 Body mass index (BMI) 38.0-38.9, adult: Secondary | ICD-10-CM

## 2019-09-12 DIAGNOSIS — E785 Hyperlipidemia, unspecified: Secondary | ICD-10-CM | POA: Diagnosis not present

## 2019-09-12 DIAGNOSIS — Z0000 Encounter for general adult medical examination without abnormal findings: Secondary | ICD-10-CM

## 2019-09-12 DIAGNOSIS — R7303 Prediabetes: Secondary | ICD-10-CM | POA: Diagnosis not present

## 2019-09-12 DIAGNOSIS — K219 Gastro-esophageal reflux disease without esophagitis: Secondary | ICD-10-CM | POA: Diagnosis not present

## 2019-09-12 DIAGNOSIS — Z79899 Other long term (current) drug therapy: Secondary | ICD-10-CM | POA: Diagnosis not present

## 2019-09-12 MED ORDER — PANTOPRAZOLE SODIUM 40 MG PO TBEC: 40.0000 mg | DELAYED_RELEASE_TABLET | Freq: Every day | ORAL | 2 refills | Status: DC

## 2019-09-12 MED ORDER — ARIPIPRAZOLE 5 MG PO TABS: ORAL_TABLET | ORAL | 0 refills | Status: DC

## 2019-09-12 MED ORDER — LORAZEPAM 0.5 MG PO TABS: 0.5000 mg | ORAL_TABLET | Freq: Every day | ORAL | 2 refills | Status: DC | PRN

## 2019-09-12 NOTE — Progress Notes (Signed)
Subjective:    Patient ID: Mario Silva, male    DOB: 03-01-1966, 54 y.o.   MRN: [id number]  Mario Silva is a 54 y.o. male presenting on 09/12/2019 for Anxiety   HPI  Mr. Deguia presents to clinic with his Mom, Mario Silva, for his routine follow up for anxiety and treatment plan for his group home.  Denies any new or acute concerns today.  Requesting medication refills and signed group home form with medication list.  Depression screen [facility] 2/9 09/12/2019 07/03/2019 02/23/2019  Decreased Interest 0 1 0  Down, Depressed, Hopeless 0 0 1  PHQ - 2 Score 0 1 1  Altered sleeping - 1 3  Tired, decreased energy - 1 0  Change in appetite - 0 2  Feeling bad or failure about yourself  - 0 0  Trouble concentrating - 1 0  Moving slowly or fidgety/restless - 0 0  Suicidal thoughts - 0 0  PHQ-9 Score - 4 6  Difficult doing work/chores - Not difficult at all Not difficult at all    Social History   Tobacco Use  . Smoking status: Never Smoker  . Smokeless tobacco: Never Used  Substance Use Topics  . Alcohol use: No  . Drug use: No    Review of Systems  Constitutional: Negative.   HENT: Negative.   Eyes: Negative.   Respiratory: Negative.   Cardiovascular: Negative.   Gastrointestinal: Negative.   Endocrine: Negative.   Genitourinary: Negative.   Musculoskeletal: Negative.   Skin: Negative.   Allergic/Immunologic: Negative.   Neurological: Negative.   Hematological: Negative.   Psychiatric/Behavioral: Negative.    Per HPI unless specifically indicated above     Objective:    BP 115/74 (BP Location: Left Arm, Patient Position: Sitting, Cuff Size: Normal)   Pulse 78   Temp 97.8 F (36.6 C) (Oral)   Ht 6\' 2"  (123XX123 m)   Wt (!) 301 lb 6.4 oz (136.7 kg)   BMI 38.70 kg/m   Wt Readings from Last 3 Encounters:  09/12/19 (!) 301 lb 6.4 oz (136.7 kg)  09/03/19 (!) 302 lb (137 kg)  07/03/19 297 lb (134.7 kg)    Physical Exam Vitals reviewed.  Constitutional:      General:  He is not in acute distress.    Appearance: Normal appearance. He is not ill-appearing or toxic-appearing.  HENT:     Head: Normocephalic.  Eyes:     General:        Right eye: No discharge.        Left eye: No discharge.     Extraocular Movements: Extraocular movements intact.     Conjunctiva/sclera: Conjunctivae normal.     Pupils: Pupils are equal, round, and reactive to light.  Cardiovascular:     Rate and Rhythm: Normal rate and regular rhythm.     Pulses: Normal pulses.     Heart sounds: Normal heart sounds. No murmur. No friction rub. No gallop.   Pulmonary:     Effort: Pulmonary effort is normal. No respiratory distress.     Breath sounds: Normal breath sounds.  Abdominal:     General: Abdomen is flat. Bowel sounds are normal. There is no distension.     Palpations: Abdomen is soft. There is no mass.     Tenderness: There is no abdominal tenderness. There is no guarding or rebound.     Hernia: No hernia is present.  Musculoskeletal:        General: Normal  range of motion.  Skin:    General: Skin is warm and dry.     Capillary Refill: Capillary refill takes less than 2 seconds.  Neurological:     General: No focal deficit present.     Mental Status: He is alert and oriented to person, place, and time.     Cranial Nerves: No cranial nerve deficit.     Sensory: No sensory deficit.     Motor: No weakness.     Coordination: Coordination normal.     Gait: Gait normal.  Psychiatric:        Attention and Perception: Attention and perception normal.        Mood and Affect: Mood and affect normal.        Speech: Speech normal.        Behavior: Behavior normal. Behavior is cooperative.        Thought Content: Thought content normal.        Cognition and Memory: Cognition and memory normal.     Results for orders placed or performed during the hospital encounter of [id number]  Basic metabolic panel  Result Value Ref Range   Sodium 142 135 - 145 mmol/L   Potassium 3.4 (L)  3.5 - 5.1 mmol/L   Chloride 105 98 - 111 mmol/L   CO2 27 22 - 32 mmol/L   Glucose, Bld 103 (H) 70 - 99 mg/dL   BUN 11 6 - 20 mg/dL   Creatinine, Ser 0.75 0.61 - 1.24 mg/dL   Calcium 9.6 8.9 - 10.3 mg/dL   GFR calc non Af Amer >60 >60 mL/min   GFR calc Af Amer >60 >60 mL/min   Anion gap 10 5 - 15  CBC  Result Value Ref Range   WBC 9.5 4.0 - 10.5 K/uL   RBC 5.73 4.22 - 5.81 MIL/uL   Hemoglobin 16.4 13.0 - 17.0 g/dL   HCT 50.3 39.0 - 52.0 %   MCV 87.8 80.0 - 100.0 fL   MCH 28.6 26.0 - 34.0 pg   MCHC 32.6 30.0 - 36.0 g/dL   RDW 13.0 11.5 - 15.5 %   Platelets 245 150 - 400 K/uL   nRBC 0.0 0.0 - 0.2 %  Troponin I (High Sensitivity)  Result Value Ref Range   Troponin I (High Sensitivity) 5 <18 ng/L  Troponin I (High Sensitivity)  Result Value Ref Range   Troponin I (High Sensitivity) 5 <18 ng/L      Assessment & Plan:   Problem List Items Addressed This Visit      Other   Recurrent major depressive disorder, in partial remission (HCC)    Stable and well controlled anxiety and depression.  Currently taking abilify 5mg  daily and lorazepam 0.5mg  tablet daily as needed and tolerating it well, managing stress well.  Plan: 1) Continue medications as prescribed 2) Continue physical activity and healthy lifestyle 3) We will see you back in 6 months       Relevant Medications   ARIPiprazole (ABILIFY) 5 MG tablet   LORazepam (ATIVAN) 0.5 MG tablet   Anxiety   Relevant Medications   LORazepam (ATIVAN) 0.5 MG tablet    Other Visit Diagnoses    Routine medical exam    -  Primary   Relevant Orders   CBC with Differential   COMPLETE METABOLIC PANEL WITH GFR   Lipid Profile   HgB A1c   Thyroid Panel With TSH   Gastroesophageal reflux disease, unspecified whether esophagitis present  Relevant Medications   pantoprazole (PROTONIX) 40 MG tablet   BMI 38.0-38.9,adult       Relevant Orders   CBC with Differential   COMPLETE METABOLIC PANEL WITH GFR   Lipid Profile   HgB  A1c   Thyroid Panel With TSH   Prediabetes       Relevant Orders   CBC with Differential   COMPLETE METABOLIC PANEL WITH GFR   Lipid Profile   HgB A1c   Thyroid Panel With TSH      Meds ordered this encounter  Medications  . ARIPiprazole (ABILIFY) 5 MG tablet    Sig: TAKE 1 TABLET BY MOUTH EVERYDAY AT BEDTIME    Dispense:  90 tablet    Refill:  0  . LORazepam (ATIVAN) 0.5 MG tablet    Sig: Take 1 tablet (0.5 mg total) by mouth daily as needed for anxiety.    Dispense:  30 tablet    Refill:  2  . pantoprazole (PROTONIX) 40 MG tablet    Sig: Take 1 tablet (40 mg total) by mouth daily before breakfast.    Dispense:  30 tablet    Refill:  2      Follow up plan: Return in about 6 months (around 03/14/2020) for Anxiety F/U.   Harlin Rain, [place] Family Nurse Practitioner [facility] 09/12/2019, 9:36 AM

## 2019-09-12 NOTE — Patient Instructions (Signed)
Have your labs completed and we will contact Ms. Amer Edkin (Mom) to give her the results.  We will see you back in clinic in 6 months, or sooner, should the need arise.  Continue your daily medications as directed.  You will receive a survey after today's visit either digitally by e-mail or paper by [employer]. Your experiences and feedback matter to [place].  Please respond so we know how we are doing as we provide care for you.  Call us with any questions/concerns/needs.  It is my goal to be available to you for your health concerns.  Thanks for choosing me to be a partner in your healthcare needs!  Harlin Rain, FNP-C Family Nurse Practitioner [facility] Phone: [phone]

## 2019-09-12 NOTE — Assessment & Plan Note (Signed)
Stable and well controlled anxiety and depression.  Currently taking abilify 5mg  daily and lorazepam 0.5mg  tablet daily as needed and tolerating it well, managing stress well.  Plan: 1) Continue medications as prescribed 2) Continue physical activity and healthy lifestyle 3) We will see you back in 6 months

## 2019-09-13 LAB — THYROID PANEL WITH TSH
Free Thyroxine Index: 2.5 (ref 1.4–3.8)
T3 Uptake: 31 % (ref 22–35)
T4, Total: 8 ug/dL (ref 4.9–10.5)
TSH: 0.81 mIU/L (ref 0.40–4.50)

## 2019-09-13 LAB — COMPLETE METABOLIC PANEL WITH GFR
AG Ratio: 1.8 (calc) (ref 1.0–2.5)
ALT: 28 U/L (ref 9–46)
AST: 24 U/L (ref 10–35)
Albumin: 4.2 g/dL (ref 3.6–5.1)
Alkaline phosphatase (APISO): 61 U/L (ref 35–144)
BUN: 12 mg/dL (ref 7–25)
CO2: 26 mmol/L (ref 20–32)
Calcium: 9.4 mg/dL (ref 8.6–10.3)
Chloride: 108 mmol/L (ref 98–110)
Creat: 0.81 mg/dL (ref 0.70–1.33)
GFR, Est African American: 117 mL/min/{1.73_m2} (ref >=60)
GFR, Est Non African American: 101 mL/min/{1.73_m2} (ref >=60)
Globulin: 2.4 g/dL (calc) (ref 1.9–3.7)
Glucose, Bld: 80 mg/dL (ref 65–99)
Potassium: 4.5 mmol/L (ref 3.5–5.3)
Sodium: 144 mmol/L (ref 135–146)
Total Bilirubin: 0.4 mg/dL (ref 0.2–1.2)
Total Protein: 6.6 g/dL (ref 6.1–8.1)

## 2019-09-13 LAB — CBC WITH DIFFERENTIAL/PLATELET
Absolute Monocytes: 680 cells/uL (ref 200–950)
Basophils Absolute: 38 cells/uL (ref 0–200)
Basophils Relative: 0.6 %
Eosinophils Absolute: 107 cells/uL (ref 15–500)
Eosinophils Relative: 1.7 %
HCT: 45.2 % (ref 38.5–50.0)
Hemoglobin: 15.1 g/dL (ref 13.2–17.1)
Lymphs Abs: 2545 cells/uL (ref 850–3900)
MCH: 29.2 pg (ref 27.0–33.0)
MCHC: 33.4 g/dL (ref 32.0–36.0)
MCV: 87.3 fL (ref 80.0–100.0)
MPV: 10.4 fL (ref 7.5–12.5)
Monocytes Relative: 10.8 %
Neutro Abs: 2930 cells/uL (ref 1500–7800)
Neutrophils Relative %: 46.5 %
Platelets: 240 10*3/uL (ref 140–400)
RBC: 5.18 10*6/uL (ref 4.20–5.80)
RDW: 12.8 % (ref 11.0–15.0)
Total Lymphocyte: 40.4 %
WBC: 6.3 10*3/uL (ref 3.8–10.8)

## 2019-09-13 LAB — LIPID PANEL
Cholesterol: 182 mg/dL (ref <200)
HDL: 41 mg/dL (ref >=40)
LDL Cholesterol (Calc): 112 mg/dL (calc) — ABNORMAL HIGH
Non-HDL Cholesterol (Calc): 141 mg/dL (calc) — ABNORMAL HIGH (ref <130)
Total CHOL/HDL Ratio: 4.4 (calc) (ref <5.0)
Triglycerides: 174 mg/dL — ABNORMAL HIGH (ref <150)

## 2019-09-13 LAB — HEMOGLOBIN A1C
Hgb A1c MFr Bld: 5.2 % of total Hgb (ref <5.7)
Mean Plasma Glucose: 103 (calc)
eAG (mmol/L): 5.7 (calc)

## 2019-09-13 NOTE — Progress Notes (Signed)
Labs look good.  He wasn't fasting for the cholesterol labs (we discussed during his visit), so they are slightly elevated, but otherwise good.

## 2019-10-16 DIAGNOSIS — G4733 Obstructive sleep apnea (adult) (pediatric): Secondary | ICD-10-CM | POA: Diagnosis not present

## 2019-10-17 ENCOUNTER — Ambulatory Visit (INDEPENDENT_AMBULATORY_CARE_PROVIDER_SITE_OTHER): Payer: Medicare Other | Admitting: Dermatology

## 2019-10-17 ENCOUNTER — Other Ambulatory Visit: Payer: Self-pay

## 2019-10-17 ENCOUNTER — Ambulatory Visit: Payer: Medicare Other | Admitting: Dermatology

## 2019-10-17 ENCOUNTER — Encounter: Payer: Self-pay | Admitting: Dermatology

## 2019-10-17 DIAGNOSIS — L853 Xerosis cutis: Secondary | ICD-10-CM | POA: Diagnosis not present

## 2019-10-17 DIAGNOSIS — D2371 Other benign neoplasm of skin of right lower limb, including hip: Secondary | ICD-10-CM | POA: Diagnosis not present

## 2019-10-17 DIAGNOSIS — D1800 Hemangioma unspecified site: Secondary | ICD-10-CM

## 2019-10-17 DIAGNOSIS — D489 Neoplasm of uncertain behavior, unspecified: Secondary | ICD-10-CM | POA: Diagnosis not present

## 2019-10-17 DIAGNOSIS — R21 Rash and other nonspecific skin eruption: Secondary | ICD-10-CM

## 2019-10-17 DIAGNOSIS — L570 Actinic keratosis: Secondary | ICD-10-CM | POA: Diagnosis not present

## 2019-10-17 DIAGNOSIS — B351 Tinea unguium: Secondary | ICD-10-CM

## 2019-10-17 DIAGNOSIS — L814 Other melanin hyperpigmentation: Secondary | ICD-10-CM

## 2019-10-17 DIAGNOSIS — L578 Other skin changes due to chronic exposure to nonionizing radiation: Secondary | ICD-10-CM

## 2019-10-17 DIAGNOSIS — B079 Viral wart, unspecified: Secondary | ICD-10-CM | POA: Diagnosis not present

## 2019-10-17 DIAGNOSIS — Z1283 Encounter for screening for malignant neoplasm of skin: Secondary | ICD-10-CM

## 2019-10-17 DIAGNOSIS — B353 Tinea pedis: Secondary | ICD-10-CM

## 2019-10-17 DIAGNOSIS — L821 Other seborrheic keratosis: Secondary | ICD-10-CM

## 2019-10-17 DIAGNOSIS — I781 Nevus, non-neoplastic: Secondary | ICD-10-CM

## 2019-10-17 DIAGNOSIS — D229 Melanocytic nevi, unspecified: Secondary | ICD-10-CM

## 2019-10-17 NOTE — Progress Notes (Signed)
Follow-Up Visit   Subjective  Mario Silva is a 54 y.o. male who presents for the following: Annual Exam (full body skin exam) and follow-up of actinic keratosis at the forehead.  Patient here for a spot on his chin for past few weeks, no pain or itch associated with it, gets irritated when he shaves. He also has a spot on the tip of his nose for about 2 weeks and has no pain or itch associated with it.  The spot that was biopsied at the right eyebrow is still present.  The following portions of the chart were reviewed this encounter and updated as appropriate: Tobacco  Allergies  Meds  Problems  Med Hx  Surg Hx  Fam Hx      Review of Systems: No other skin or systemic complaints.  Objective  Well appearing patient in no apparent distress; mood and affect are within normal limits.  A full examination was performed including scalp, head, eyes, ears, nose, lips, neck, chest, axillae, abdomen, back, buttocks, bilateral upper extremities, bilateral lower extremities, hands, feet, fingers, toes, fingernails, and toenails. All findings within normal limits unless otherwise noted below.  Objective  Right Eyebrow: Erythematous thin papules/macules with gritty scale.   Objective  Left Antecubital Fossa: 1.8cm firm subcutaneous nodule moblile  Objective  Toenails: Toenails thickened with subungual debris  Objective  bilateral shins: Hyperpigmented atrophic plaques b/l shins  Objective  Bilateral foot: Scaling and maceration web spaces and over distal and lateral soles.   Objective  Right Neck: Verrucous papules -- Discussed viral etiology and contagion.   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   Onychomycosis Toenails  Chronic.  Discussed treatment option with p.o. terbinafine.  Deferred today.  Advised that tinea pedis will continue recurring in the setting of chronic onychomycosis and recommend once to twice weekly clotrimazole cream to feet to prevent recurrent tinea pedis once it is cleared.  Rash bilateral shins  Chronic, stable at this time.  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  Offered ciclopirox today but patient defers at this time.  They will let us know if this does not clear with the clotrimazole cream.    Viral warts, unspecified type Right Neck  Discussed viral etiology and risk of spread.  Discussed multiple treatments may be required to clear warts.  Discussed possible post-treatment dyspigmentation and risk of recurrence.  Cryotherapy today  Destruction of lesion - Right Neck  Destruction method: cryotherapy   Informed consent: discussed and consent obtained   Lesion destroyed using liquid nitrogen: Yes   Outcome: patient tolerated procedure well with no complications   Post-procedure details: wound care instructions given    Xerosis cutis Left Forearm  Reassured.  use gentle skin care - handout given  Skin cancer screening performed today.  Actinic Damage - diffuse scaly erythematous macules with underlying dyspigmentation - Recommend daily broad spectrum sunscreen SPF 30+  to sun-exposed areas, reapply every 2 hours as needed.  - Call for new or changing lesions.  Lentigines - Scattered tan macules - Discussed due to sun exposure - Benign, observe - Call for any changes  Melanocytic Nevi - Tan-brown and/or pink-flesh-colored symmetric macules and papules - Benign appearing on exam today - Observation - Call clinic for new or changing moles - Recommend daily use of broad spectrum spf 30+ sunscreen to sun-exposed areas.   Telangiectasia at nose - Dilated blood vessel    - Benign appearing on exam - Call for changes    Dermatofibroma at right thigh - Firm pink/brown papulenodule with dimple sign - Benign appearing - Call for any changes  Hemangiomas - Red papules - Discussed benign nature - Observe - Call for any changes  Seborrheic Keratoses - Stuck-on, waxy, tan-brown papules and plaques  - Discussed benign etiology and prognosis. - Observe - Call for any changes   Return 3 weeks, for AK Follow up, Wart.   IDonzetta Kohut, CMA, am acting as scribe for Forest Gleason, MD .  Documentation: I have reviewed the above documentation for accuracy and completeness, and I agree with the above.  Forest Gleason, MD

## 2019-10-17 NOTE — Patient Instructions (Addendum)
Recommend daily broad spectrum sunscreen SPF 30+ to sun-exposed areas, reapply every 2 hours as needed. Call for new or changing lesions.  Liquid nitrogen was applied for 10-12 seconds to the skin lesion and the expected blistering or scabbing reaction explained. Do not pick at the area. Patient reminded to expect hypopigmented scars from the procedure. Return if lesion fails to fully resolve. Gentle Skin Care Guide  1. Bathe no more than once a day.  2. Avoid bathing in hot water  3. Use a mild soap like Dove, Vanicream, Cetaphil, CeraVe. Can use Lever 2000 or  Cetaphil antibacterial soap  4. Use soap only where you need it. On most days, use it under your arms, betweenyour legs, and on your fee. Let the water rinse other areas unless visibly dirty.  5. When you get out of the bath/shower, use a towel to gently blot your skin dry, don't rub it.  6. While your skin is still a little damp, apply a moisturizing cream such as Vanicream,  CeraVe, Cetaphil, Eucerin, Sarna lotion or plain Vaseline Jelly. For hands apply  Neutrogena [place] Hand Cream or Excipial Hand Cream.  7. Reapply moisturizer any time you start to itch or feel dry.  8. Sometimes using free and clear laundry detergents can be helpful. Fabric softener  sheets should be avoided. Downy Free & Gentle liquid, or any liquid fabric softener  that is free of dyes and perfumes, it acceptable to use  9. If your doctor has given you prescription creams you may apply moisturizers over them   TINEA PEDIS: Continue OTC Lotrimin cream twice daily  all over feet and in btween toes. If lotrimin does not work, will consider Cyclopriox in future  RASH:  Cont tacrolimus cream twice daily as needed for itch tenderness or progression

## 2019-10-31 ENCOUNTER — Ambulatory Visit: Payer: Self-pay | Admitting: Family Medicine

## 2019-10-31 NOTE — Telephone Encounter (Signed)
Pt's mother calling, on DPR, pt present during call.  Reports "Worsening SOB x 2 weeks." States "Not really SOB, just has to rest more with activity." States breathing through mouth as he has done in past when seasonal allergies are bad." States took Zyrtec which  helped "A little." Denies wheezing, no cough, no fever. Pt able to lie flat at HS, no SOB at rest. Also reports weight gain.  No known covid exposure. Both mother and pt have received both covid vaccines, second in March. NT called practice, [facility]. Appt scheduled for tomorrow 11/01/2019 at 1320. Advised ED if symptoms worsen, mother verbalizes understanding.   Answer Assessment - Initial Assessment Questions 1. RESPIRATORY STATUS: "Describe your breathing?" (e.g., wheezing, shortness of breath, unable to speak, severe coughing)      With exertion only, minimal. 2. ONSET: "When did this breathing problem begin?"      2 weeks ago, worsening 2 days. Worst today 3. PATTERN "Does the difficult breathing come and go, or has it been constant since it started?"     Comes and goes 4. SEVERITY: "How bad is your breathing?" (e.g., mild, moderate, severe)    - MILD: No SOB at rest, mild SOB with walking, speaks normally in sentences, can lay down, no retractions, pulse < 100.    - MODERATE: SOB at rest, SOB with minimal exertion and prefers to sit, cannot lie down flat, speaks in phrases, mild retractions, audible wheezing, pulse 100-120.    - SEVERE: Very SOB at rest, speaks in single words, struggling to breathe, sitting hunched forward, retractions, pulse > 120      moderate 5. RECURRENT SYMPTOM: "Have you had difficulty breathing before?" If so, ask: "When was the last time?" and "What happened that time?"      Yes, off and on. 6. CARDIAC HISTORY: "Do you have any history of heart disease?" (e.g., heart attack, angina, bypass surgery, angioplasty)      no 7. LUNG HISTORY: "Do you have any history of lung disease?"  (e.g., pulmonary embolus,  asthma, emphysema)     allergies 8. CAUSE: "What do you think is causing the breathing problem?"      Unsure, can't wear mask. 9. OTHER SYMPTOMS: "Do you have any other symptoms? (e.g., dizziness, runny nose, cough, chest pain, fever)     no  Protocols used: BREATHING DIFFICULTY-A-AH

## 2019-10-31 NOTE — Telephone Encounter (Signed)
  Reason for Disposition . [1] MILD difficulty breathing (e.g., minimal/no SOB at rest, SOB with walking, pulse <100) AND [2] NEW-onset or WORSE than normal  Protocols used: BREATHING DIFFICULTY-A-AH

## 2019-10-31 NOTE — Telephone Encounter (Signed)
I called and spoke with the patient mother and informed her that we do not see patient in the office with SOB because that is a symptoms of COVID. Appt changed over to virtual and patient is aware if his symptoms worsen that he needs to go to the ER. I also informed her that it is possible that Elmyra Ricks may decide that the patient needs to be evaluated in person after talking to him on tomorrow. She verbalize understanding, no questions or concerns.

## 2019-11-01 ENCOUNTER — Encounter: Payer: Self-pay | Admitting: Family Medicine

## 2019-11-01 ENCOUNTER — Telehealth (INDEPENDENT_AMBULATORY_CARE_PROVIDER_SITE_OTHER): Payer: Medicare Other | Admitting: Family Medicine

## 2019-11-01 ENCOUNTER — Other Ambulatory Visit: Payer: Self-pay

## 2019-11-01 VITALS — Temp 98.1°F

## 2019-11-01 DIAGNOSIS — R5381 Other malaise: Secondary | ICD-10-CM

## 2019-11-01 DIAGNOSIS — J302 Other seasonal allergic rhinitis: Secondary | ICD-10-CM | POA: Diagnosis not present

## 2019-11-01 MED ORDER — FLUTICASONE PROPIONATE 50 MCG/ACT NA SUSP: 2.0000 | Freq: Every day | NASAL | 6 refills | Status: DC

## 2019-11-01 MED ORDER — CETIRIZINE HCL 10 MG PO TABS: 10.0000 mg | ORAL_TABLET | Freq: Every day | ORAL | 3 refills | Status: DC

## 2019-11-01 NOTE — Progress Notes (Signed)
Virtual Visit via Telephone  The purpose of this virtual visit is to provide medical care while limiting exposure to the novel coronavirus (COVID19) for both patient and office staff.  Consent was obtained for phone visit:  Yes.   Answered questions that patient had about telehealth interaction:  Yes.   I discussed the limitations, risks, security and privacy concerns of performing an evaluation and management service by telephone. I also discussed with the patient that there may be a patient responsible charge related to this service. The patient expressed understanding and agreed to proceed.  Patient is at home and is accessed via telephone with his mother, Mario Silva. Services are provided by Mario Rain, FNP-C from [facility] (Office)  ---------------------------------------------------------------------- Chief Complaint  Patient presents with  . Breathing Problem    SOB with exertion. Pt mother state when he walked out to the mailbox today he notice some SOB. The pt state that he's not SOB, but the mother states she notice that his breathing is more labored then usual.  Pt think it could be associated with allergies x 3 weeks    S: Reviewed CMA documentation. I have called patient and gathered additional HPI as follows:  Mario Silva, along side Mario Silva, on speakerphone presents for telemedicine visit.  Reports she has noticed Mario Silva is having some increased SOB with exertion over the past 3 weeks.  She noticed it when they were in the grocery store a few days ago and again when he was getting the mail from the Mario Silva.  Reports that he had lost approximately 60 lbs prior to COVID and with quarantine, decreased physical activity and increased caloric intake, he has gained most of that weight back.  Has a reported history of allergy to a tree that Mario Silva is unable to recall.  Reports lives in a home that is surrounded by trees currently and believes that seasonal allergies  may be a component to his symptoms.  Denies exposure to any sick contacts, all members of his group home have been vaccinated for [place].  Denies fevers, sore throat, change in taste/smell, headache, sinus pressure/pain, cough, CP, abdominal pain, n/v/d.  Reports no SOB at rest.  No known history of asthma or COPD.  Past Medical History:  Diagnosis Date  . Acid reflux   . Anxiety   . Depression   . Mental retardation   . Restless leg syndrome   . Sleep apnea    Social History   Tobacco Use  . Smoking status: Never Smoker  . Smokeless tobacco: Never Used  Substance Use Topics  . Alcohol use: No  . Drug use: No    Current Outpatient Medications:  .  ARIPiprazole (ABILIFY) 5 MG tablet, TAKE 1 TABLET BY MOUTH EVERYDAY AT BEDTIME, Disp: 90 tablet, Rfl: 0 .  b complex vitamins tablet, Take 1 tablet by mouth daily., Disp: , Rfl:  .  Calcium Polycarbophil (FIBER-CAPS PO), Take by mouth., Disp: , Rfl:  .  Cholecalciferol (VITAMIN D3 PO), Take by mouth., Disp: , Rfl:  .  LORazepam (ATIVAN) 0.5 MG tablet, Take 1 tablet (0.5 mg total) by mouth daily as needed for anxiety., Disp: 30 tablet, Rfl: 2 .  magnesium oxide (MAG-OX) 400 MG tablet, Take 400 mg by mouth daily., Disp: , Rfl:  .  meclizine (ANTIVERT) 25 MG tablet, Take 1 tablet (25 mg total) by mouth 3 (three) times daily as needed for dizziness., Disp: 30 tablet, Rfl: 0 .  pantoprazole (PROTONIX) 40  MG tablet, Take 1 tablet (40 mg total) by mouth daily before breakfast. (Patient taking differently: Take 40 mg by mouth daily as needed. ), Disp: 30 tablet, Rfl: 2 .  cetirizine (ZYRTEC) 10 MG tablet, Take 1 tablet (10 mg total) by mouth daily., Disp: 90 tablet, Rfl: 3 .  fluticasone (FLONASE) 50 MCG/ACT nasal spray, Place 2 sprays into both nostrils daily., Disp: 16 g, Rfl: 6 .  Probiotic Product (PROBIOTIC DAILY PO), Take by mouth., Disp: , Rfl:   Depression screen [facility] 2/9 09/12/2019 07/03/2019 02/23/2019  Decreased Interest 0 1 0  Down,  Depressed, Hopeless 0 0 1  PHQ - 2 Score 0 1 1  Altered sleeping - 1 3  Tired, decreased energy - 1 0  Change in appetite - 0 2  Feeling bad or failure about yourself  - 0 0  Trouble concentrating - 1 0  Moving slowly or fidgety/restless - 0 0  Suicidal thoughts - 0 0  PHQ-9 Score - 4 6  Difficult doing work/chores - Not difficult at all Not difficult at all    GAD 7 : Generalized Anxiety Score 09/12/2019 02/23/2019  Nervous, Anxious, on Edge 0 1  Control/stop worrying 1 0  Worry too much - different things 0 0  Trouble relaxing 0 0  Restless 0 0  Easily annoyed or irritable 3 1  Afraid - awful might happen 0 0  Total GAD 7 Score 4 2  Anxiety Difficulty Not difficult at all Not difficult at all    -------------------------------------------------------------------------- O: No physical exam performed due to remote telephone encounter.  Physical Exam: Patient remotely monitored without video.  Verbal communication appropriate.  Cognition normal.  Recent Results (from the past 2160 hour(s))  CBC with Differential     Status: None   Collection Time: 09/12/19 10:11 AM  Result Value Ref Range   WBC 6.3 3.8 - 10.8 Thousand/uL   RBC 5.18 4.20 - 5.80 Million/uL   Hemoglobin 15.1 13.2 - 17.1 g/dL   HCT 45.2 38.5 - 50.0 %   MCV 87.3 80.0 - 100.0 fL   MCH 29.2 27.0 - 33.0 pg   MCHC 33.4 32.0 - 36.0 g/dL   RDW 12.8 11.0 - 15.0 %   Platelets 240 140 - 400 Thousand/uL   MPV 10.4 7.5 - 12.5 fL   Neutro Abs 2,930 1,500 - 7,800 cells/uL   Lymphs Abs 2,545 850 - 3,900 cells/uL   Absolute Monocytes 680 200 - 950 cells/uL   Eosinophils Absolute 107 15 - 500 cells/uL   Basophils Absolute 38 0 - 200 cells/uL   Neutrophils Relative % 46.5 %   Total Lymphocyte 40.4 %   Monocytes Relative 10.8 %   Eosinophils Relative 1.7 %   Basophils Relative 0.6 %  COMPLETE METABOLIC PANEL WITH GFR     Status: None   Collection Time: 09/12/19 10:11 AM  Result Value Ref Range   Glucose, Bld 80 65 -  99 mg/dL    Comment: .            Fasting reference interval .    BUN 12 7 - 25 mg/dL   Creat 0.81 0.70 - 1.33 mg/dL    Comment: For patients >43 years of age, the reference limit for Creatinine is approximately 13% higher for people identified as African-American. .    GFR, Est Non African American 101 > OR = 60 mL/min/1.75m2   GFR, Est African American 117 > OR = 60 mL/min/1.33m2   BUN/Creatinine Ratio  NOT APPLICABLE 6 - 22 (calc)   Sodium 144 135 - 146 mmol/L   Potassium 4.5 3.5 - 5.3 mmol/L   Chloride 108 98 - 110 mmol/L   CO2 26 20 - 32 mmol/L   Calcium 9.4 8.6 - 10.3 mg/dL   Total Protein 6.6 6.1 - 8.1 g/dL   Albumin 4.2 3.6 - 5.1 g/dL   Globulin 2.4 1.9 - 3.7 g/dL (calc)   AG Ratio 1.8 1.0 - 2.5 (calc)   Total Bilirubin 0.4 0.2 - 1.2 mg/dL   Alkaline phosphatase (APISO) 61 35 - 144 U/L   AST 24 10 - 35 U/L   ALT 28 9 - 46 U/L  Lipid Profile     Status: Abnormal   Collection Time: 09/12/19 10:11 AM  Result Value Ref Range   Cholesterol 182 <200 mg/dL   HDL 41 > OR = 40 mg/dL   Triglycerides 174 (H) <150 mg/dL   LDL Cholesterol (Calc) 112 (H) mg/dL (calc)    Comment: Reference range: <100 . Desirable range <100 mg/dL for primary prevention;   <70 mg/dL for patients with CHD or diabetic patients  with > or = 2 CHD risk factors. Marland Kitchen LDL-C is now calculated using the Martin-Hopkins  calculation, which is a validated novel method providing  better accuracy than the Friedewald equation in the  estimation of LDL-C.  Cresenciano Genre et al. Annamaria Helling. [id number]): 2061-2068  (http://education.[facility].com/faq/FAQ164)    Total CHOL/HDL Ratio 4.4 <5.0 (calc)   Non-HDL Cholesterol (Calc) 141 (H) <130 mg/dL (calc)    Comment: For patients with diabetes plus 1 major ASCVD risk  factor, treating to a non-HDL-C goal of <100 mg/dL  (LDL-C of <70 mg/dL) is considered a therapeutic  option.   HgB A1c     Status: None   Collection Time: 09/12/19 10:11 AM  Result Value Ref Range    Hgb A1c MFr Bld 5.2 <5.7 % of total Hgb    Comment: For the purpose of screening for the presence of diabetes: . <5.7%       Consistent with the absence of diabetes 5.7-6.4%    Consistent with increased risk for diabetes             (prediabetes) > or =6.5%  Consistent with diabetes . This assay result is consistent with a decreased risk of diabetes. . Currently, no consensus exists regarding use of hemoglobin A1c for diagnosis of diabetes in children. . According to American Diabetes Association (ADA) guidelines, hemoglobin A1c <7.0% represents optimal control in non-pregnant diabetic patients. Different metrics may apply to specific patient populations.  Standards of Medical Care in Diabetes(ADA). .    Mean Plasma Glucose 103 (calc)   eAG (mmol/L) 5.7 (calc)  Thyroid Panel With TSH     Status: None   Collection Time: 09/12/19 10:11 AM  Result Value Ref Range   T3 Uptake 31 22 - 35 %   T4, Total 8.0 4.9 - 10.5 mcg/dL   Free Thyroxine Index 2.5 1.4 - 3.8   TSH 0.81 0.40 - 4.50 mIU/L    -------------------------------------------------------------------------- A&P:  Problem List Items Addressed This Visit      Other   Seasonal allergies - Primary    History of seasonal allergies to unknown tree.  In past three weeks has been having some increased shortness of breath with exertion and likely exacerbated by seasonal allergies.  Discussed had lost >60lbs prior to COVID and with quarantine and doing less activity has gained almost all of the weight  back slowly and has been less physically active.  Discussed likelihood of physical deconditioning and weight gain adding to his SOB with exertion.  Discussed trying to walk 30 minutes a few times per day, decreasing fat/sugar intake, and taking allergy medication should begin to see difference in symptoms.  No risks for COVID exposure or s/s of infection.    Plan: 1. Cetirizine and Flonase sent to pharmacy to begin 2. Letter  written and left at front desk for Mother, Mario Silva, to pick up and bring to group home with dietary changes and new medication orders 3. ER precautions reviewed if patient has worsening of symptoms to proceed to the ER for evaluation. 4. Follow up as needed      Relevant Medications   cetirizine (ZYRTEC) 10 MG tablet   fluticasone (FLONASE) 50 MCG/ACT nasal spray   Physical deconditioning    See seasonal allergies A/P         Meds ordered this encounter  Medications  . cetirizine (ZYRTEC) 10 MG tablet    Sig: Take 1 tablet (10 mg total) by mouth daily.    Dispense:  90 tablet    Refill:  3  . fluticasone (FLONASE) 50 MCG/ACT nasal spray    Sig: Place 2 sprays into both nostrils daily.    Dispense:  16 g    Refill:  6    Follow-up: - Return as needed if worsening of symptoms or no relief with current treatment plan  Patient verbalizes understanding with the above medical recommendations including the limitation of remote medical advice.  Specific follow-up and call-back criteria were given for patient to follow-up or seek medical care more urgently if needed.   - Time spent in direct consultation with patient on phone: 12 minutes  Mario Silva, [facility] 11/01/2019, 1:24 PM

## 2019-11-01 NOTE — Patient Instructions (Signed)
As discussed, beginning Matt on cetirizine 10mg  daily along with flonase daily to help with seasonal allergies to unknown tree.  Also, encouraged daily exercise, if even just walking for 30 minutes at a time a few times per day, coupled with reduction in dietary fat/sugar intake and should begin to see weight loss and improvement in physical endurance/conditioning.  Strict ER precautions.  If having any shortness of breath, difficulty breathing, impending sense of doom, chest pain to proceed to the emergency room immediately!  We will plan to see you back as needed.  You will receive a survey after today's visit either digitally by e-mail or paper by [employer]. Your experiences and feedback matter to [place].  Please respond so we know how we are doing as we provide care for you.  Call us with any questions/concerns/needs.  It is my goal to be available to you for your health concerns.  Thanks for choosing me to be a partner in your healthcare needs!  Harlin Rain, FNP-C Family Nurse Practitioner [facility] Phone: [phone]

## 2019-11-01 NOTE — Assessment & Plan Note (Signed)
See seasonal allergies A/P

## 2019-11-01 NOTE — Assessment & Plan Note (Signed)
History of seasonal allergies to unknown tree.  In past three weeks has been having some increased shortness of breath with exertion and likely exacerbated by seasonal allergies.  Discussed had lost >60lbs prior to COVID and with quarantine and doing less activity has gained almost all of the weight back slowly and has been less physically active.  Discussed likelihood of physical deconditioning and weight gain adding to his SOB with exertion.  Discussed trying to walk 30 minutes a few times per day, decreasing fat/sugar intake, and taking allergy medication should begin to see difference in symptoms.  No risks for COVID exposure or s/s of infection.    Plan: 1. Cetirizine and Flonase sent to pharmacy to begin 2. Letter written and left at front desk for Mother, [name], to pick up and bring to group home with dietary changes and new medication orders 3. ER precautions reviewed if patient has worsening of symptoms to proceed to the ER for evaluation. 4. Follow up as needed

## 2019-11-07 ENCOUNTER — Ambulatory Visit: Payer: Medicare Other | Admitting: Dermatology

## 2019-12-11 ENCOUNTER — Ambulatory Visit (INDEPENDENT_AMBULATORY_CARE_PROVIDER_SITE_OTHER): Payer: Medicare Other

## 2019-12-11 DIAGNOSIS — Z0000 Encounter for general adult medical examination without abnormal findings: Secondary | ICD-10-CM | POA: Diagnosis not present

## 2019-12-11 NOTE — Patient Instructions (Signed)
[name] , Thank you for taking time to come for your Medicare Wellness Visit. I appreciate your ongoing commitment to your health goals. Please review the following plan we discussed and let me know if I can assist you in the future.   Screening recommendations/referrals: Colonoscopy: completed 2019 Recommended yearly ophthalmology/optometry visit for glaucoma screening and checkup Recommended yearly dental visit for hygiene and checkup  Vaccinations: Influenza vaccine: due 02/2020 Pneumococcal vaccine: not indicated  Tdap vaccine: up to date  Shingles vaccine: shingrix eligible    Covid-19: completed   Advanced directives: Advance directive discussed with you today. Even though you declined this today please call our office should you change your mind and we can give you the proper paperwork for you to fill out.  Conditions/risks identified: none   Next appointment: Follow up in one year for your annual wellness visit   Preventive Care 40-64 Years, Male Preventive care refers to lifestyle choices and visits with your health care provider that can promote health and wellness. What does preventive care include?  A yearly physical exam. This is also called an annual well check.  Dental exams once or twice a year.  Routine eye exams. Ask your health care provider how often you should have your eyes checked.  Personal lifestyle choices, including:  Daily care of your teeth and gums.  Regular physical activity.  Eating a healthy diet.  Avoiding tobacco and drug use.  Limiting alcohol use.  Practicing safe sex.  Taking low-dose aspirin every day starting at age 32. What happens during an annual well check? The services and screenings done by your health care provider during your annual well check will depend on your age, overall health, lifestyle risk factors, and family history of disease. Counseling  Your health care provider may ask you questions about your:  Alcohol  use.  Tobacco use.  Drug use.  Emotional well-being.  Home and relationship well-being.  Sexual activity.  Eating habits.  Work and work Statistician. Screening  You may have the following tests or measurements:  Height, weight, and BMI.  Blood pressure.  Lipid and cholesterol levels. These may be checked every 5 years, or more frequently if you are over 55 years old.  Skin check.  Lung cancer screening. You may have this screening every year starting at age 11 if you have a 30-pack-year history of smoking and currently smoke or have quit within the past 15 years.  Fecal occult blood test (FOBT) of the stool. You may have this test every year starting at age 32.  Flexible sigmoidoscopy or colonoscopy. You may have a sigmoidoscopy every 5 years or a colonoscopy every 10 years starting at age 45.  Prostate cancer screening. Recommendations will vary depending on your family history and other risks.  Hepatitis C blood test.  Hepatitis B blood test.  Sexually transmitted disease (STD) testing.  Diabetes screening. This is done by checking your blood sugar (glucose) after you have not eaten for a while (fasting). You may have this done every 1-3 years. Discuss your test results, treatment options, and if necessary, the need for more tests with your health care provider. Vaccines  Your health care provider may recommend certain vaccines, such as:  Influenza vaccine. This is recommended every year.  Tetanus, diphtheria, and acellular pertussis (Tdap, Td) vaccine. You may need a Td booster every 10 years.  Zoster vaccine. You may need this after age 22.  Pneumococcal 13-valent conjugate (PCV13) vaccine. You may need this if  you have certain conditions and have not been vaccinated.  Pneumococcal polysaccharide (PPSV23) vaccine. You may need one or two doses if you smoke cigarettes or if you have certain conditions. Talk to your health care provider about which screenings  and vaccines you need and how often you need them. This information is not intended to replace advice given to you by your health care provider. Make sure you discuss any questions you have with your health care provider. Document Released: 07/11/2015 Document Revised: 03/03/2016 Document Reviewed: 04/15/2015 Elsevier Interactive Patient Education  2017 [place] Prevention in the Home Falls can cause injuries. They can happen to people of all ages. There are many things you can do to make your home safe and to help prevent falls. What can I do on the outside of my home?  Regularly fix the edges of walkways and driveways and fix any cracks.  Remove anything that might make you trip as you walk through a door, such as a raised step or threshold.  Trim any bushes or trees on the path to your home.  Use bright outdoor lighting.  Clear any walking paths of anything that might make someone trip, such as rocks or tools.  Regularly check to see if handrails are loose or broken. Make sure that both sides of any steps have handrails.  Any raised decks and porches should have guardrails on the edges.  Have any leaves, snow, or ice cleared regularly.  Use sand or salt on walking paths during winter.  Clean up any spills in your garage right away. This includes oil or grease spills. What can I do in the bathroom?  Use night lights.  Install grab bars by the toilet and in the tub and shower. Do not use towel bars as grab bars.  Use non-skid mats or decals in the tub or shower.  If you need to sit down in the shower, use a plastic, non-slip stool.  Keep the floor dry. Clean up any water that spills on the floor as soon as it happens.  Remove soap buildup in the tub or shower regularly.  Attach bath mats securely with double-sided non-slip rug tape.  Do not have throw rugs and other things on the floor that can make you trip. What can I do in the bedroom?  Use night  lights.  Make sure that you have a light by your bed that is easy to reach.  Do not use any sheets or blankets that are too big for your bed. They should not hang down onto the floor.  Have a firm chair that has side arms. You can use this for support while you get dressed.  Do not have throw rugs and other things on the floor that can make you trip. What can I do in the kitchen?  Clean up any spills right away.  Avoid walking on wet floors.  Keep items that you use a lot in easy-to-reach places.  If you need to reach something above you, use a strong step stool that has a grab bar.  Keep electrical cords out of the way.  Do not use floor polish or wax that makes floors slippery. If you must use wax, use non-skid floor wax.  Do not have throw rugs and other things on the floor that can make you trip. What can I do with my stairs?  Do not leave any items on the stairs.  Make sure that there are handrails on both  sides of the stairs and use them. Fix handrails that are broken or loose. Make sure that handrails are as long as the stairways.  Check any carpeting to make sure that it is firmly attached to the stairs. Fix any carpet that is loose or worn.  Avoid having throw rugs at the top or bottom of the stairs. If you do have throw rugs, attach them to the floor with carpet tape.  Make sure that you have a light switch at the top of the stairs and the bottom of the stairs. If you do not have them, ask someone to add them for you. What else can I do to help prevent falls?  Wear shoes that:  Do not have high heels.  Have rubber bottoms.  Are comfortable and fit you well.  Are closed at the toe. Do not wear sandals.  If you use a stepladder:  Make sure that it is fully opened. Do not climb a closed stepladder.  Make sure that both sides of the stepladder are locked into place.  Ask someone to hold it for you, if possible.  Clearly mark and make sure that you can  see:  Any grab bars or handrails.  First and last steps.  Where the edge of each step is.  Use tools that help you move around (mobility aids) if they are needed. These include:  Canes.  Walkers.  Scooters.  Crutches.  Turn on the lights when you go into a dark area. Replace any light bulbs as soon as they burn out.  Set up your furniture so you have a clear path. Avoid moving your furniture around.  If any of your floors are uneven, fix them.  If there are any pets around you, be aware of where they are.  Review your medicines with your doctor. Some medicines can make you feel dizzy. This can increase your chance of falling. Ask your doctor what other things that you can do to help prevent falls. This information is not intended to replace advice given to you by your health care provider. Make sure you discuss any questions you have with your health care provider. Document Released: 04/10/2009 Document Revised: 11/20/2015 Document Reviewed: 07/19/2014 Elsevier Interactive Patient Education  2017 [employer].

## 2019-12-11 NOTE — Progress Notes (Signed)
Subjective:   Mario Silva is a 54 y.o. male who presents for an Initial Medicare Annual Wellness Visit.  I connected with today Mario Silva by telephone and verified that I am speaking with the correct person using two identifiers. Location patient: home Location provider: work Persons participating in the virtual visit: patient, Marine scientist.    I discussed the limitations, risks, security and privacy concerns of performing an evaluation and management service by telephone and the availability of in person appointments. I also discussed with the patient that there may be a patient responsible charge related to this service. The patient expressed understanding and verbally consented to this telephonic visit.    Interactive audio and video telecommunications were attempted between this provider and patient, however failed, due to patient having technical difficulties OR patient did not have access to video capability.  We continued and completed visit with audio only.  Some vital signs may be absent or patient reported.   Time Spent with patient on telephone encounter: 20 minutes  Review of Systems   Cardiac Risk Factors include: advanced age (>44men, >9 women);male gender    Objective:    There were no vitals filed for this visit. There is no height or weight on file to calculate BMI.  Advanced Directives 12/11/2019 06/25/2019 02/08/2018 12/07/2014  Does Patient Have a Medical Advance Directive? No No Yes No  Type of Advance Directive - Public librarian;Living will -  Copy of [place] in Chart? - - No - copy requested -  Would patient like information on creating a medical advance directive? - - - No - patient declined information    Current Medications (verified) Outpatient Encounter Medications as of 12/11/2019  Medication Sig  . ARIPiprazole (ABILIFY) 5 MG tablet TAKE 1 TABLET BY MOUTH EVERYDAY AT BEDTIME  . b complex vitamins tablet Take 1 tablet  by mouth daily.  . Calcium Polycarbophil (FIBER-CAPS PO) Take by mouth.  . cetirizine (ZYRTEC) 10 MG tablet Take 1 tablet (10 mg total) by mouth daily.  . Cholecalciferol (VITAMIN D3 PO) Take by mouth.  . fluticasone (FLONASE) 50 MCG/ACT nasal spray Place 2 sprays into both nostrils daily.  Marland Kitchen LORazepam (ATIVAN) 0.5 MG tablet Take 1 tablet (0.5 mg total) by mouth daily as needed for anxiety.  . magnesium oxide (MAG-OX) 400 MG tablet Take 400 mg by mouth daily.  . meclizine (ANTIVERT) 25 MG tablet Take 1 tablet (25 mg total) by mouth 3 (three) times daily as needed for dizziness.  . pantoprazole (PROTONIX) 40 MG tablet Take 1 tablet (40 mg total) by mouth daily before breakfast. (Patient taking differently: Take 40 mg by mouth daily as needed. )  . Probiotic Product (PROBIOTIC DAILY PO) Take by mouth.   No facility-administered encounter medications on file as of 12/11/2019.    Allergies (verified) Penicillins, Latex, and Latex   History: Past Medical History:  Diagnosis Date  . Acid reflux   . Anxiety   . Depression   . Mental retardation   . Restless leg syndrome   . Sleep apnea    Past Surgical History:  Procedure Laterality Date  . COLONOSCOPY    . COLONOSCOPY WITH PROPOFOL N/A 02/08/2018   Procedure: COLONOSCOPY WITH PROPOFOL;  Surgeon: Robert Bellow, MD;  Location: [facility] ENDOSCOPY;  Service: General;  Laterality: N/A;  . diviated septum    . NOSE SURGERY     Family History  Problem Relation Age of Onset  . Hypertension  Mother   . Hyperlipidemia Mother   . Anxiety disorder Mother   . Thyroid disease Mother    Social History   Socioeconomic History  . Marital status: Single    Spouse name: Not on file  . Number of children: Not on file  . Years of education: Not on file  . Highest education level: Not on file  Occupational History  . Not on file  Tobacco Use  . Smoking status: Never Smoker  . Smokeless tobacco: Never Used  Vaping Use  . Vaping Use: Never  used  Substance and Sexual Activity  . Alcohol use: No  . Drug use: No  . Sexual activity: Not on file  Other Topics Concern  . Not on file  Social History Narrative   ** Merged History Encounter **       Social Determinants of Health   Financial Resource Strain:   . Difficulty of Paying Living Expenses:   Food Insecurity:   . Worried About Charity fundraiser in the Last Year:   . Arboriculturist in the Last Year:   Transportation Needs:   . Film/video editor (Medical):   Marland Kitchen Lack of Transportation (Non-Medical):   Physical Activity:   . Days of Exercise per Week:   . Minutes of Exercise per Session:   Stress:   . Feeling of Stress :   Social Connections:   . Frequency of Communication with Friends and Family:   . Frequency of Social Gatherings with Friends and Family:   . Attends Religious Services:   . Active Member of Clubs or Organizations:   . Attends Archivist Meetings:   Marland Kitchen Marital Status:    Tobacco Counseling Counseling given: Not Answered   Clinical Intake:  Pre-visit preparation completed: Yes  Pain : No/denies pain     Nutritional Status: BMI > 30  Obese Nutritional Risks: None Diabetes: No  How often do you need to have someone help you when you read instructions, pamphlets, or other written materials from your doctor or pharmacy?: 1 - Never  Interpreter Needed?: No  Information entered by :: Malacki Mcphearson,LPN  Activities of Daily Living In your present state of health, do you have any difficulty performing the following activities: 12/11/2019 02/23/2019  Hearing? N N  Vision? N Y  Comment eyeglasses, went to [facility] in past -  Difficulty concentrating or making decisions? N Y  Walking or climbing stairs? N N  Dressing or bathing? N N  Doing errands, shopping? Tempie Silva  Comment mother drives -  Conservation officer, nature and eating ? N -  Using the Toilet? N -  In the past six months, have you accidently leaked urine? N -  Do you have  problems with loss of bowel control? N -  Managing your Medications? N -  Managing your Finances? Y -  Comment mother does -  Runner, broadcasting/film/video? Y -  Comment mother does most of it -  Some recent data might be hidden     Immunizations and Health Maintenance Immunization History  Administered Date(s) Administered  . Influenza,inj,Quad PF,6+ Mos 02/23/2019  . PFIZER SARS-COV-2 Vaccination 08/27/2019, 09/17/2019  . Tdap 02/02/2018   Health Maintenance Due  Topic Date Due  . Hepatitis C Screening  Never done  . HIV Screening  Never done    Patient Care Team: Verl Bangs, FNP as PCP - General (Family Medicine) Lavera Guise, MD (Internal Medicine) Leretha Pol  E, NP as Nurse Practitioner (Family Medicine) Bary Castilla, Forest Gleason, MD (General Surgery)  Indicate any recent Medical Services you may have received from other than [facility] providers in the past year (date may be approximate).    Assessment:   This is a routine wellness examination for [facility].  Hearing/Vision screen No exam data present  Dietary issues and exercise activities discussed: Current Exercise Habits: Home exercise routine, Type of exercise: walking (uses fitbit for steps), Time (Minutes): 30, Frequency (Times/Week): 3, Weekly Exercise (Minutes/Week): 90, Intensity: Mild, Exercise limited by: None identified  Goals Addressed   None    Depression Screen PHQ 2/9 Scores 12/11/2019 09/12/2019 07/03/2019 02/23/2019  PHQ - 2 Score 1 0 1 1  PHQ- 9 Score - - 4 6    Fall Risk Fall Risk  12/11/2019 02/23/2019 09/22/2017  Falls in the past year? 0 0 No  Number falls in past yr: 0 0 -  Injury with Fall? 0 - -    FALL RISK PREVENTION PERTAINING TO THE HOME:  Any stairs in or around the home? No  If so, are there any without handrails? Yes   Home free of loose throw rugs in walkways, pet beds, electrical cords, etc? Yes  Adequate lighting in your home to reduce risk of falls? Yes    ASSISTIVE DEVICES UTILIZED TO PREVENT FALLS:  Life alert? No  Use of a cane, walker or w/c? No  Grab bars in the bathroom? Yes  Shower chair or bench in shower? No  Elevated toilet seat or a handicapped toilet? Yes    TIMED UP AND GO:  Unable to perform    Cognitive Function:        Screening Tests Health Maintenance  Topic Date Due  . Hepatitis C Screening  Never done  . HIV Screening  Never done  . INFLUENZA VACCINE  01/27/2020  . TETANUS/TDAP  02/03/2028  . COLONOSCOPY  02/09/2028  . COVID-19 Vaccine  Completed    Qualifies for Shingles Vaccine? Yes  Zostavax completed n/a. Due for Shingrix. Education has been provided regarding the importance of this vaccine. Pt has been advised to call insurance company to determine out of pocket expense. Advised may also receive vaccine at local pharmacy or Health Dept. Verbalized acceptance and understanding.  Tdap: up to date   Flu Vaccine: up to date   Pneumococcal Vaccine: not indicted .   Covid-19 Vaccine: Completed vaccines  Cancer Screenings:  Colorectal Screening: Completed 2019 Repeat every 10 years  Lung Cancer Screening: (Low Dose CT Chest recommended if Age 54-80 years, 30 pack-year currently smoking OR have quit w/in 15years.) does not qualify.     Additional Screening:  Hepatitis C Screening: does qualify  Vision Screening: Recommended annual ophthalmology exams for early detection of glaucoma and other disorders of the eye. Is the patient up to date with their annual eye exam?  Yes  Who is the provider or what is the name of the office in which the pt attends annual eye exams? [facility]   Dental Screening: Recommended annual dental exams for proper oral hygiene  Community Resource Referral:  CRR required this visit?  No        Plan:  I have personally reviewed and addressed the Medicare Annual Wellness questionnaire and have noted the following in the patient's chart:  A. Medical  and social history B. Use of alcohol, tobacco or illicit drugs  C. Current medications and supplements D. Functional ability and status E.  Nutritional  status F.  Physical activity G. Advance directives H. List of other physicians I.  Hospitalizations, surgeries, and ER visits in previous 12 months J.  [place] such as hearing and vision if needed, cognitive and depression L. Referrals and appointments   In addition, I have reviewed and discussed with patient certain preventive protocols, quality metrics, and best practice recommendations. A written personalized care plan for preventive services as well as general preventive health recommendations were provided to patient.   Signed,    Bevelyn Ngo, LPN   5/75/0518  Nurse Health Advisor   Nurse Notes:  None

## 2019-12-12 ENCOUNTER — Other Ambulatory Visit: Payer: Self-pay

## 2019-12-12 ENCOUNTER — Ambulatory Visit (INDEPENDENT_AMBULATORY_CARE_PROVIDER_SITE_OTHER): Payer: Medicare Other | Admitting: Dermatology

## 2019-12-12 DIAGNOSIS — L219 Seborrheic dermatitis, unspecified: Secondary | ICD-10-CM

## 2019-12-12 DIAGNOSIS — L309 Dermatitis, unspecified: Secondary | ICD-10-CM | POA: Diagnosis not present

## 2019-12-12 NOTE — Progress Notes (Signed)
   Follow-Up Visit   Subjective  Mario Silva is a 54 y.o. male who presents for the following: Follow-up.  Patient here today for follow up for a biopsy proven AK with wart virus at right eye brow. Has been treated with LN2 three times.  Patient also has a spot on left shoulder that has been there for about 1 week, no symptoms.   The following portions of the chart were reviewed this encounter and updated as appropriate:      Review of Systems:  No other skin or systemic complaints except as noted in HPI or Assessment and Plan.  Objective  Well appearing patient in no apparent distress; mood and affect are within normal limits.  A focused examination was performed including face, left shoulder. Relevant physical exam findings are noted in the Assessment and Plan.  Objective  Right Eyebrow: Pink patches with greasy scale. Also similar in BL ears  Objective  Left Anterior Shoulder: Pink patch   Assessment & Plan  Seborrheic dermatitis Right Eyebrow  Vs recurrent AK   Start hydrocortisone 2.5% twice a day to affected areas. Recheck on f/up  Dermatitis Left Anterior Shoulder  Start hydrocortisone 2.5% cream twice a day to affected area shoulder.  Return in about 3 weeks (around 01/02/2020).  Graciella Belton, RMA, am acting as scribe for Brendolyn Patty, MD .  Documentation: I have reviewed the above documentation for accuracy and completeness, and I agree with the above.  Brendolyn Patty MD

## 2020-01-03 ENCOUNTER — Ambulatory Visit: Payer: Medicare Other | Admitting: Dermatology

## 2020-01-04 ENCOUNTER — Encounter: Payer: Self-pay | Admitting: Family Medicine

## 2020-01-04 ENCOUNTER — Other Ambulatory Visit: Payer: Self-pay

## 2020-01-04 ENCOUNTER — Ambulatory Visit (INDEPENDENT_AMBULATORY_CARE_PROVIDER_SITE_OTHER): Payer: Medicare Other | Admitting: Family Medicine

## 2020-01-04 VITALS — BP 109/82 | HR 80 | Temp 99.0°F | Resp 20 | Ht 74.0 in | Wt 308.4 lb

## 2020-01-04 DIAGNOSIS — J342 Deviated nasal septum: Secondary | ICD-10-CM | POA: Diagnosis not present

## 2020-01-04 DIAGNOSIS — R0981 Nasal congestion: Secondary | ICD-10-CM | POA: Diagnosis not present

## 2020-01-04 MED ORDER — SALINE NASAL SPRAY 0.65 % NA SOLN: 1.0000 | NASAL | 12 refills | Status: DC | PRN

## 2020-01-04 NOTE — Assessment & Plan Note (Signed)
See 'deviated septum' A/P

## 2020-01-04 NOTE — Progress Notes (Signed)
Subjective:    Patient ID: Mario Silva, male    DOB: [birth year], 54 y.o.   MRN: [id number]  Mario Silva is a 54 y.o. male presenting on 01/04/2020 for Nasal Congestion (diffuculty breathing through his nasal passage w/ history of deviated nasal septum )   HPI  Mario Silva presents to clinic with his mom for worsening nasal congestion.  Has seasonal allergies, which have worsened since moving to [place].  Believes there is a certain type of pollen on their property that exacerbates his allergies.  Was started on flonase and cetirizine on 11/01/2019 with good relief of symptoms up until recently.  Mrs. Maahs notes that patient is mouth breathing more and sounds very nasal when speaking/breathing.  Has a history of deviated septum repair x 2, once surgery being in the 1980's and second one in the 1990's.  Denies fevers, headaches, sinus pressure/pain, pain in teeth, nasal drainage, sore throat, change in taste/smell, CP, abdominal pain, SOB, n/v/d.  Depression screen [facility] 2/9 12/11/2019 09/12/2019 07/03/2019  Decreased Interest 0 0 1  Down, Depressed, Hopeless 1 0 0  PHQ - 2 Score 1 0 1  Altered sleeping - - 1  Tired, decreased energy - - 1  Change in appetite - - 0  Feeling bad or failure about yourself  - - 0  Trouble concentrating - - 1  Moving slowly or fidgety/restless - - 0  Suicidal thoughts - - 0  PHQ-9 Score - - 4  Difficult doing work/chores - - Not difficult at all    Social History   Tobacco Use  . Smoking status: Never Smoker  . Smokeless tobacco: Never Used  Vaping Use  . Vaping Use: Never used  Substance Use Topics  . Alcohol use: No  . Drug use: No    Review of Systems  Constitutional: Negative.   HENT: Positive for congestion. Negative for dental problem, drooling, ear discharge, ear pain, facial swelling, hearing loss, mouth sores, nosebleeds, postnasal drip, rhinorrhea, sinus pressure, sinus pain, sneezing, sore throat, tinnitus, trouble swallowing and voice  change.   Eyes: Negative.   Respiratory: Negative.   Cardiovascular: Negative.   Gastrointestinal: Negative.   Endocrine: Negative.   Genitourinary: Negative.   Musculoskeletal: Negative.   Skin: Negative.   Allergic/Immunologic: Negative.   Neurological: Negative.   Hematological: Negative.   Psychiatric/Behavioral: Negative.    Per HPI unless specifically indicated above     Objective:    BP 109/82 (BP Location: Left Arm, Patient Position: Sitting, Cuff Size: Normal)   Pulse 80   Temp 99 F (37.2 C) (Oral)   Resp 20   Ht 6\' 2"  (1.88 m)   Wt (!) 308 lb 6.4 oz (139.9 kg)   SpO2 95%   BMI 39.60 kg/m   Wt Readings from Last 3 Encounters:  01/04/20 (!) 308 lb 6.4 oz (139.9 kg)  09/12/19 (!) 301 lb 6.4 oz (136.7 kg)  09/03/19 (!) 302 lb (137 kg)    Physical Exam Vitals reviewed.  Constitutional:      General: He is not in acute distress.    Appearance: Normal appearance. He is well-developed and well-groomed. He is not ill-appearing or toxic-appearing.  HENT:     Head: Normocephalic and atraumatic.     Nose: Septal deviation, mucosal edema and congestion present. No nasal deformity, signs of injury, laceration, nasal tenderness or rhinorrhea.     Right Nostril: No foreign body, epistaxis, septal hematoma or occlusion.     Left  Nostril: No foreign body, epistaxis, septal hematoma or occlusion.     Right Turbinates: Enlarged and swollen.     Left Turbinates: Enlarged and swollen.     Right Sinus: No maxillary sinus tenderness or frontal sinus tenderness.     Left Sinus: No maxillary sinus tenderness or frontal sinus tenderness.     Comments: Lizbeth Bark is in place, covering mouth and nose. Eyes:     General:        Right eye: No discharge.        Left eye: No discharge.     Extraocular Movements: Extraocular movements intact.     Conjunctiva/sclera: Conjunctivae normal.     Pupils: Pupils are equal, round, and reactive to light.  Cardiovascular:     Rate and Rhythm:  Normal rate and regular rhythm.     Pulses: Normal pulses.     Heart sounds: Normal heart sounds. No murmur heard.  No friction rub. No gallop.   Pulmonary:     Effort: Pulmonary effort is normal. No respiratory distress.     Breath sounds: Normal breath sounds.  Musculoskeletal:     Right lower leg: No edema.     Left lower leg: No edema.  Lymphadenopathy:     Cervical: No cervical adenopathy.  Skin:    General: Skin is warm and dry.     Capillary Refill: Capillary refill takes less than 2 seconds.  Neurological:     General: No focal deficit present.     Mental Status: He is alert and oriented to person, place, and time.  Psychiatric:        Attention and Perception: Attention and perception normal.        Mood and Affect: Mood and affect normal.        Speech: Speech normal.        Behavior: Behavior normal. Behavior is cooperative.        Thought Content: Thought content normal.    Results for orders placed or performed in visit on 09/12/19  CBC with Differential  Result Value Ref Range   WBC 6.3 3.8 - 10.8 Thousand/uL   RBC 5.18 4.20 - 5.80 Million/uL   Hemoglobin 15.1 13.2 - 17.1 g/dL   HCT 45.2 38 - 50 %   MCV 87.3 80.0 - 100.0 fL   MCH 29.2 27.0 - 33.0 pg   MCHC 33.4 32.0 - 36.0 g/dL   RDW 12.8 11.0 - 15.0 %   Platelets 240 140 - 400 Thousand/uL   MPV 10.4 7.5 - 12.5 fL   Neutro Abs 2,930 1,500 - 7,800 cells/uL   Lymphs Abs 2,545 850 - 3,900 cells/uL   Absolute Monocytes 680 200 - 950 cells/uL   Eosinophils Absolute 107 15 - 500 cells/uL   Basophils Absolute 38 0 - 200 cells/uL   Neutrophils Relative % 46.5 %   Total Lymphocyte 40.4 %   Monocytes Relative 10.8 %   Eosinophils Relative 1.7 %   Basophils Relative 0.6 %  COMPLETE METABOLIC PANEL WITH GFR  Result Value Ref Range   Glucose, Bld 80 65 - 99 mg/dL   BUN 12 7 - 25 mg/dL   Creat 0.81 0.70 - 1.33 mg/dL   GFR, Est Non African American 101 > OR = 60 mL/min/1.13m2   GFR, Est African American 117 > OR  = 60 mL/min/1.57m2   BUN/Creatinine Ratio NOT APPLICABLE 6 - 22 (calc)   Sodium 144 135 - 146 mmol/L   Potassium 4.5 3.5 - 5.3  mmol/L   Chloride 108 98 - 110 mmol/L   CO2 26 20 - 32 mmol/L   Calcium 9.4 8.6 - 10.3 mg/dL   Total Protein 6.6 6.1 - 8.1 g/dL   Albumin 4.2 3.6 - 5.1 g/dL   Globulin 2.4 1.9 - 3.7 g/dL (calc)   AG Ratio 1.8 1.0 - 2.5 (calc)   Total Bilirubin 0.4 0.2 - 1.2 mg/dL   Alkaline phosphatase (APISO) 61 35 - 144 U/L   AST 24 10 - 35 U/L   ALT 28 9 - 46 U/L  Lipid Profile  Result Value Ref Range   Cholesterol 182 <200 mg/dL   HDL 41 > OR = 40 mg/dL   Triglycerides 174 (H) <150 mg/dL   LDL Cholesterol (Calc) 112 (H) mg/dL (calc)   Total CHOL/HDL Ratio 4.4 <5.0 (calc)   Non-HDL Cholesterol (Calc) 141 (H) <130 mg/dL (calc)  HgB A1c  Result Value Ref Range   Hgb A1c MFr Bld 5.2 <5.7 % of total Hgb   Mean Plasma Glucose 103 (calc)   eAG (mmol/L) 5.7 (calc)  Thyroid Panel With TSH  Result Value Ref Range   T3 Uptake 31 22 - 35 %   T4, Total 8.0 4.9 - 10.5 mcg/dL   Free Thyroxine Index 2.5 1.4 - 3.8   TSH 0.81 0.40 - 4.50 mIU/L      Assessment & Plan:   Problem List Items Addressed This Visit      Respiratory   Deviated septum - Primary    Deviated septum, to the left, on exam, with nasal congestion.  Will add Afrin nasal spray, using no more than 3 days in a row to prevent rebound congestion, in addition to flonase and cetirizine.  Patient eager to meet with ENT to discuss surgical options.  Plan: 1. Begin afrin nasal spray as directed 2. Referral to ENT placed today 3. RTC PRN      Relevant Medications   sodium chloride (AFRIN SALINE NASAL MIST) 0.65 % nasal spray   Other Relevant Orders   Ambulatory referral to ENT     Other   Nasal congestion    See 'deviated septum' A/P      Relevant Medications   sodium chloride (AFRIN SALINE NASAL MIST) 0.65 % nasal spray      Meds ordered this encounter  Medications  . sodium chloride (AFRIN SALINE  NASAL MIST) 0.65 % nasal spray    Sig: Place 1 spray into the nose as needed for congestion.    Dispense:  30 mL    Refill:  12      Follow up plan: Return if symptoms worsen or fail to improve.   Harlin Rain, [place] Family Nurse Practitioner [facility] 01/04/2020, 10:28 AM

## 2020-01-04 NOTE — Patient Instructions (Addendum)
I have sent in a prescription for Afrin to use as needed for nasal congestion.  It is important to only use this prescription up to 3 days in a row, then take a break for a day, to prevent rebound nasal congestion.  A referral to ENT for your deviated septum and nasal congestion, concerns for needing surgery, has been placed today.  If you have not heard from the specialty office or our referral coordinator within 1 week, please let us know and we will follow up with the referral coordinator for an update.  We will plan to see you back as needed for this  You will receive a survey after today's visit either digitally by e-mail or paper by [place] mail. Your experiences and feedback matter to [place].  Please respond so we know how we are doing as we provide care for you.  Call us with any questions/concerns/needs.  It is my goal to be available to you for your health concerns.  Thanks for choosing me to be a partner in your healthcare needs!  Harlin Rain, FNP-C Family Nurse Practitioner [facility] Phone: [phone]

## 2020-01-04 NOTE — Assessment & Plan Note (Signed)
Deviated septum, to the left, on exam, with nasal congestion.  Will add Afrin nasal spray, using no more than 3 days in a row to prevent rebound congestion, in addition to flonase and cetirizine.  Patient eager to meet with ENT to discuss surgical options.  Plan: 1. Begin afrin nasal spray as directed 2. Referral to ENT placed today 3. RTC PRN

## 2020-01-14 DIAGNOSIS — G4733 Obstructive sleep apnea (adult) (pediatric): Secondary | ICD-10-CM | POA: Diagnosis not present

## 2020-01-15 ENCOUNTER — Ambulatory Visit (INDEPENDENT_AMBULATORY_CARE_PROVIDER_SITE_OTHER): Payer: Medicare Other | Admitting: Dermatology

## 2020-01-15 ENCOUNTER — Other Ambulatory Visit: Payer: Self-pay

## 2020-01-15 DIAGNOSIS — L219 Seborrheic dermatitis, unspecified: Secondary | ICD-10-CM | POA: Diagnosis not present

## 2020-01-15 DIAGNOSIS — L570 Actinic keratosis: Secondary | ICD-10-CM | POA: Diagnosis not present

## 2020-01-15 DIAGNOSIS — L309 Dermatitis, unspecified: Secondary | ICD-10-CM | POA: Diagnosis not present

## 2020-01-15 NOTE — Patient Instructions (Signed)
Cryotherapy Aftercare  . Wash gently with soap and water everyday.   . Apply Vaseline and Band-Aid daily until healed.  

## 2020-01-15 NOTE — Progress Notes (Signed)
   Follow-Up Visit   Subjective  Mario Silva is a 54 y.o. male who presents for the following: Seb Derm (bil ears) vs Recurrent AK (R eyebrow) (Using HC 2.5% Cream but not consistent. Ears are improved, not sure about eyebrow. Right eyebrow was bx proven AK with features of wart on 08/30/19 and has been treated with LN2 several times.) and Dermatitis (L anterior shoulder, improved with HC 2.5% Cream).  The following portions of the chart were reviewed this encounter and updated as appropriate:      Review of Systems:  No other skin or systemic complaints except as noted in HPI or Assessment and Plan.  Objective  Well appearing patient in no apparent distress; mood and affect are within normal limits.  A focused examination was performed including face, ears, shoulder. Relevant physical exam findings are noted in the Assessment and Plan.  Objective  Bil Ears: Clear.  Objective  Right Lateral Eyebrow x 1: 2.0 x 1.0cm pink scaly patch, did not clear with use of HC 2.5% cream  Objective  Left Anterior Shoulder: Clear.   Assessment & Plan  Seborrheic dermatitis Bil Ears  Improved. Continue HC 2.5% Cream prn flares.  AK (actinic keratosis) Right Lateral Eyebrow x 1  biopsy proven AK with features of a wart 08/30/19  Destruction of lesion - Right Lateral Eyebrow x 1  Destruction method: cryotherapy   Informed consent: discussed and consent obtained   Lesion destroyed using liquid nitrogen: Yes   Region frozen until ice ball extended beyond lesion: Yes   Outcome: patient tolerated procedure well with no complications   Post-procedure details: wound care instructions given    Dermatitis Left Anterior Shoulder  Improved with HC 2.5% Cream.  Recommend mild soap and moisturizing cream 1-2 times daily.    Return in about 6 weeks (around 02/26/2020) for AK, Right lateral eyebrow with Dr Laurence Ferrari.  IJamesetta Orleans, CMA, am acting as scribe for Brendolyn Patty, MD .  Documentation:  I have reviewed the above documentation for accuracy and completeness, and I agree with the above.  Brendolyn Patty MD

## 2020-01-16 ENCOUNTER — Telehealth: Payer: Self-pay

## 2020-01-16 ENCOUNTER — Ambulatory Visit (INDEPENDENT_AMBULATORY_CARE_PROVIDER_SITE_OTHER): Payer: Medicare Other | Admitting: Family Medicine

## 2020-01-16 ENCOUNTER — Other Ambulatory Visit: Payer: Self-pay

## 2020-01-16 ENCOUNTER — Ambulatory Visit: Payer: Self-pay | Admitting: *Deleted

## 2020-01-16 ENCOUNTER — Encounter: Payer: Self-pay | Admitting: Family Medicine

## 2020-01-16 DIAGNOSIS — Z711 Person with feared health complaint in whom no diagnosis is made: Secondary | ICD-10-CM

## 2020-01-16 DIAGNOSIS — Z9889 Other specified postprocedural states: Secondary | ICD-10-CM | POA: Diagnosis not present

## 2020-01-16 NOTE — Telephone Encounter (Signed)
Summary: Clinical Advice - O2 SAT    Mother would like to discuss patient O2 Sat being 73 and 55 today, mother states machine is new and unsure if this is normal. Patient is scheduled today with PCP to discuss but patient mother would like to know if this is concerning, no other symptoms.

## 2020-01-16 NOTE — Telephone Encounter (Signed)
Spoke with patient's mother and advised her Dr. Les Pou response per above. She said she thinks it may have improved a little bit since this morning. Patient saw his PCP today and they had already advised ibuprofen 400mg  every 4-6 hours as needed, JS

## 2020-01-16 NOTE — Assessment & Plan Note (Signed)
Mother states had used the pulse ox on patient and had readings in the 80's, but had put on his finger and turned on at the same time.  Reports had taken it a second time and it was 94%, which is his baseline.  Likely related to technique of using pulse ox, unable to reproduce low oxygen status in clinic.  Plan: 1. Educated on having pulse ox machine turned on before putting on a finger 2. If having a low reading to make sure fingers are not cold, can wash with warm water and recheck 3. If having shortness of breath or difficulty breathing and a low pulse ox, to contact 911 or proceed to the nearest emergency room

## 2020-01-16 NOTE — Telephone Encounter (Signed)
Mother is calling with concern that patient O2 sat reading was in 80's- it is now reading 96. She thinks she may have placed it wrong or her meter is not good.Patient is not in distress and there are no changes in his breathing per her report. An appointment has been scheduled to check patient at office today. Reason for Disposition  [1] MILD difficulty breathing (e.g., minimal/no SOB at rest, SOB with walking, pulse <100) AND [2] NEW-onset or WORSE than normal    Mother was concerned about reading she got on O2 sat meter- it was in 80's. She has checked it again and patient is 30 now. Appointment has been made for today to check patient.  Answer Assessment - Initial Assessment Questions 1. RESPIRATORY STATUS: "Describe your breathing?" (e.g., wheezing, shortness of breath, unable to speak, severe coughing)      Patient has congestion- feeling bad today- eye swelling, notbreathing worse than normal 2. ONSET: "When did this breathing problem begin?"      Mother has been checking every 2-3 days- O2 sat- 94 normal, now- 96  3. PATTERN "Does the difficult breathing come and go, or has it been constant since it started?"      Normal with congestion 4. SEVERITY: "How bad is your breathing?" (e.g., mild, moderate, severe)    - MILD: No SOB at rest, mild SOB with walking, speaks normally in sentences, can lay down, no retractions, pulse < 100.    - MODERATE: SOB at rest, SOB with minimal exertion and prefers to sit, cannot lie down flat, speaks in phrases, mild retractions, audible wheezing, pulse 100-120.    - SEVERE: Very SOB at rest, speaks in single words, struggling to breathe, sitting hunched forward, retractions, pulse > 120      Patient is having nasal congestion/setal issues- has been having some labored breathing with that- mother states no worse 5. RECURRENT SYMPTOM: "Have you had difficulty breathing before?" If Yes, ask: "When was the last time?" and "What happened that time?"      no 6.  CARDIAC HISTORY: "Do you have any history of heart disease?" (e.g., heart attack, angina, bypass surgery, angioplasty)      no 7. LUNG HISTORY: "Do you have any history of lung disease?"  (e.g., pulmonary embolus, asthma, emphysema)     no 8. CAUSE: "What do you think is causing the breathing problem?"      Septal problem- congestion 9. OTHER SYMPTOMS: "Do you have any other symptoms? (e.g., dizziness, runny nose, cough, chest pain, fever)     No other problems noted 10. PREGNANCY: "Is there any chance you are pregnant?" "When was your last menstrual period?"       n/a 11. TRAVEL: "Have you traveled out of the country in the last month?" (e.g., travel history, exposures)       n/a  Protocols used: BREATHING DIFFICULTY-A-AH

## 2020-01-16 NOTE — Progress Notes (Signed)
Subjective:    Patient ID: Mario Silva, male    DOB: 08-12-1965, 54 y.o.   MRN: [id number]  Mario Silva is a 54 y.o. male presenting on 01/16/2020 for deviated septum (pt mother concern his oxygen might have dropped. His SPO2 was reading 88 and its normal 94. The pt was not showing any signs pf SOB at the time. The pt mother admits she could have done something incorreectly ) and Skin Cancer (pt had a frozen spot over the right eye on yesterday. He complains of soreness and swelling with discomfort.  )   HPI  Mario Silva presents to clinic with his mom.  Mom reports she was checking patient's oxygen and had gotten a reading of 88% when she had it on his finger.  Took this off of his finger and rechecked his oxygen and it was 94%.  Denied any shortness of breath, difficulty breathing, dizziness, lightheadedness when had first reading.  Reports patient had a skin shaving biopsy completed yesterday and has had some swelling and discomfort at the site of biopsy.  Denies drainage from site or fevers.  Reports swelling has improved throughout the day.  Depression screen [facility] 2/9 12/11/2019 09/12/2019 07/03/2019  Decreased Interest 0 0 1  Down, Depressed, Hopeless 1 0 0  PHQ - 2 Score 1 0 1  Altered sleeping - - 1  Tired, decreased energy - - 1  Change in appetite - - 0  Feeling bad or failure about yourself  - - 0  Trouble concentrating - - 1  Moving slowly or fidgety/restless - - 0  Suicidal thoughts - - 0  PHQ-9 Score - - 4  Difficult doing work/chores - - Not difficult at all    Social History   Tobacco Use  . Smoking status: Never Smoker  . Smokeless tobacco: Never Used  Vaping Use  . Vaping Use: Never used  Substance Use Topics  . Alcohol use: No  . Drug use: No    Review of Systems  Constitutional: Negative.   HENT: Negative.   Eyes: Negative.   Respiratory: Negative.   Cardiovascular: Negative.   Gastrointestinal: Negative.   Endocrine: Negative.   Genitourinary:  Negative.   Musculoskeletal: Negative.   Skin: Positive for wound. Negative for color change, pallor and rash.  Allergic/Immunologic: Negative.   Neurological: Negative.   Hematological: Negative.   Psychiatric/Behavioral: Negative.    Per HPI unless specifically indicated above     Objective:    BP 116/78   Pulse 97   Temp 98.4 F (36.9 C) (Oral)   Ht 6\' 2"  (1.88 m)   Wt (!) 309 lb 12.8 oz (140.5 kg)   BMI 39.78 kg/m   Wt Readings from Last 3 Encounters:  01/16/20 (!) 309 lb 12.8 oz (140.5 kg)  01/04/20 (!) 308 lb 6.4 oz (139.9 kg)  09/12/19 (!) 301 lb 6.4 oz (136.7 kg)    Physical Exam Vitals reviewed.  Constitutional:      General: He is not in acute distress.    Appearance: Normal appearance. He is well-developed and well-groomed. He is obese. He is not ill-appearing or toxic-appearing.  HENT:     Head: Normocephalic and atraumatic.     Nose:     Comments: Lizbeth Bark is in place, covering mouth and nose. Eyes:     General:        Right eye: No discharge.        Left eye: No discharge.  Extraocular Movements: Extraocular movements intact.     Conjunctiva/sclera: Conjunctivae normal.     Pupils: Pupils are equal, round, and reactive to light.  Cardiovascular:     Rate and Rhythm: Normal rate and regular rhythm.     Pulses: Normal pulses.     Heart sounds: Normal heart sounds. No murmur heard.  No friction rub. No gallop.   Pulmonary:     Effort: Pulmonary effort is normal. No respiratory distress.     Breath sounds: Normal breath sounds.  Musculoskeletal:     Right lower leg: No edema.     Left lower leg: No edema.  Skin:    General: Skin is warm and dry.     Capillary Refill: Capillary refill takes less than 2 seconds.       Neurological:     General: No focal deficit present.     Mental Status: He is alert and oriented to person, place, and time.  Psychiatric:        Attention and Perception: Attention and perception normal.        Mood and Affect:  Mood and affect normal.        Speech: Speech normal.        Behavior: Behavior normal. Behavior is cooperative.        Thought Content: Thought content normal.        Cognition and Memory: Cognition and memory normal.    Results for orders placed or performed in visit on 09/12/19  CBC with Differential  Result Value Ref Range   WBC 6.3 3.8 - 10.8 Thousand/uL   RBC 5.18 4.20 - 5.80 Million/uL   Hemoglobin 15.1 13.2 - 17.1 g/dL   HCT 45.2 38 - 50 %   MCV 87.3 80.0 - 100.0 fL   MCH 29.2 27.0 - 33.0 pg   MCHC 33.4 32.0 - 36.0 g/dL   RDW 12.8 11.0 - 15.0 %   Platelets 240 140 - 400 Thousand/uL   MPV 10.4 7.5 - 12.5 fL   Neutro Abs 2,930 1,500 - 7,800 cells/uL   Lymphs Abs 2,545 850 - 3,900 cells/uL   Absolute Monocytes 680 200 - 950 cells/uL   Eosinophils Absolute 107 15 - 500 cells/uL   Basophils Absolute 38 0 - 200 cells/uL   Neutrophils Relative % 46.5 %   Total Lymphocyte 40.4 %   Monocytes Relative 10.8 %   Eosinophils Relative 1.7 %   Basophils Relative 0.6 %  COMPLETE METABOLIC PANEL WITH GFR  Result Value Ref Range   Glucose, Bld 80 65 - 99 mg/dL   BUN 12 7 - 25 mg/dL   Creat 0.81 0.70 - 1.33 mg/dL   GFR, Est Non African American 101 > OR = 60 mL/min/1.7m2   GFR, Est African American 117 > OR = 60 mL/min/1.8m2   BUN/Creatinine Ratio NOT APPLICABLE 6 - 22 (calc)   Sodium 144 135 - 146 mmol/L   Potassium 4.5 3.5 - 5.3 mmol/L   Chloride 108 98 - 110 mmol/L   CO2 26 20 - 32 mmol/L   Calcium 9.4 8.6 - 10.3 mg/dL   Total Protein 6.6 6.1 - 8.1 g/dL   Albumin 4.2 3.6 - 5.1 g/dL   Globulin 2.4 1.9 - 3.7 g/dL (calc)   AG Ratio 1.8 1.0 - 2.5 (calc)   Total Bilirubin 0.4 0.2 - 1.2 mg/dL   Alkaline phosphatase (APISO) 61 35 - 144 U/L   AST 24 10 - 35 U/L   ALT 28 9 -  46 U/L  Lipid Profile  Result Value Ref Range   Cholesterol 182 <200 mg/dL   HDL 41 > OR = 40 mg/dL   Triglycerides 174 (H) <150 mg/dL   LDL Cholesterol (Calc) 112 (H) mg/dL (calc)   Total CHOL/HDL Ratio  4.4 <5.0 (calc)   Non-HDL Cholesterol (Calc) 141 (H) <130 mg/dL (calc)  HgB A1c  Result Value Ref Range   Hgb A1c MFr Bld 5.2 <5.7 % of total Hgb   Mean Plasma Glucose 103 (calc)   eAG (mmol/L) 5.7 (calc)  Thyroid Panel With TSH  Result Value Ref Range   T3 Uptake 31 22 - 35 %   T4, Total 8.0 4.9 - 10.5 mcg/dL   Free Thyroxine Index 2.5 1.4 - 3.8   TSH 0.81 0.40 - 4.50 mIU/L      Assessment & Plan:   Problem List Items Addressed This Visit      Other   Person with feared complaint in whom no diagnosis was made    Mother states had used the pulse ox on patient and had readings in the 80's, but had put on his finger and turned on at the same time.  Reports had taken it a second time and it was 94%, which is his baseline.  Likely related to technique of using pulse ox, unable to reproduce low oxygen status in clinic.  Plan: 1. Educated on having pulse ox machine turned on before putting on a finger 2. If having a low reading to make sure fingers are not cold, can wash with warm water and recheck 3. If having shortness of breath or difficulty breathing and a low pulse ox, to contact 911 or proceed to the nearest emergency room      Status post biopsy of skin    Reports patient had skin shaving done with dermatology yesterday, has noticed some swelling in the morning and discomfort.  Discussed following dermatology post-biopsy recommendations of using ibuprofen 400mg  and applying vaseline to biopsy area.  Mother verbalized understanding and denied additional questions.  Plan: 1. Can take ibuprofen 400mg  every 6 hours as needed for discomfort and swelling 2. Can apply vaseline to biopsy area 3. If increased swelling and responsive to an ice pack, can use for topical relief of discomfort and swelling 4. Keep scheduled follow up visit with dermatology         No orders of the defined types were placed in this encounter.     Follow up plan: Return if symptoms worsen or fail to  improve.   Harlin Rain, [place] Family Nurse Practitioner [facility] 01/16/2020, 2:03 PM

## 2020-01-16 NOTE — Patient Instructions (Signed)
As we discussed, continue to wear your CPAP, can try to use over the counter ear plugs that you can get from a local pharmacy to help with cutting down the noise from the CPAP when you sleep.  Can take ibuprofen 400mg  every 6 hours as needed for swelling and discomfort.  As we discussed, if you get a low reading on a pulse ox for Matt, to recheck his oxygen, can wash his hands with warm water and recheck.  We will plan to see you back as needed for this  You will receive a survey after today's visit either digitally by e-mail or paper by [place] mail. Your experiences and feedback matter to [place].  Please respond so we know how we are doing as we provide care for you.  Call us with any questions/concerns/needs.  It is my goal to be available to you for your health concerns.  Thanks for choosing me to be a partner in your healthcare needs!  Harlin Rain, FNP-C Family Nurse Practitioner [facility] Phone: [phone]

## 2020-01-16 NOTE — Assessment & Plan Note (Signed)
Reports patient had skin shaving done with dermatology yesterday, has noticed some swelling in the morning and discomfort.  Discussed following dermatology post-biopsy recommendations of using ibuprofen 400mg  and applying vaseline to biopsy area.  Mother verbalized understanding and denied additional questions.  Plan: 1. Can take ibuprofen 400mg  every 6 hours as needed for discomfort and swelling 2. Can apply vaseline to biopsy area 3. If increased swelling and responsive to an ice pack, can use for topical relief of discomfort and swelling 4. Keep scheduled follow up visit with dermatology

## 2020-01-16 NOTE — Telephone Encounter (Signed)
Patient's mother left message at  8:09 this morning that patient's eye has a lot of swelling following LN2 treatment to spot above right eyebrow yesterday. She said it is not swollen shut but would like to know how to treat it, JS

## 2020-01-16 NOTE — Telephone Encounter (Signed)
It will be more swollen first thing in the morning when he gets up, but the swelling will go down throughout the day.  It will also be the most prominent the first 3-4 days, and will gradually decrease after that.  He can put a cool pack on his eye for 10-15 min in the morning when he gets up, and later in the day if the swelling persists.  Continue vaseline ointment daily to wound as it is healing.  He could also take some ibuprofen 400 mg by mouth with food in the morning and evening.

## 2020-01-21 DIAGNOSIS — J301 Allergic rhinitis due to pollen: Secondary | ICD-10-CM | POA: Diagnosis not present

## 2020-01-21 DIAGNOSIS — G4733 Obstructive sleep apnea (adult) (pediatric): Secondary | ICD-10-CM | POA: Diagnosis not present

## 2020-01-21 DIAGNOSIS — R42 Dizziness and giddiness: Secondary | ICD-10-CM | POA: Diagnosis not present

## 2020-01-21 DIAGNOSIS — J3489 Other specified disorders of nose and nasal sinuses: Secondary | ICD-10-CM | POA: Diagnosis not present

## 2020-01-23 ENCOUNTER — Other Ambulatory Visit: Payer: Self-pay

## 2020-01-23 ENCOUNTER — Ambulatory Visit (INDEPENDENT_AMBULATORY_CARE_PROVIDER_SITE_OTHER): Payer: Medicare Other | Admitting: Podiatry

## 2020-01-23 ENCOUNTER — Encounter: Payer: Self-pay | Admitting: Podiatry

## 2020-01-23 DIAGNOSIS — M2042 Other hammer toe(s) (acquired), left foot: Secondary | ICD-10-CM | POA: Diagnosis not present

## 2020-01-23 DIAGNOSIS — Q828 Other specified congenital malformations of skin: Secondary | ICD-10-CM

## 2020-01-23 NOTE — Progress Notes (Signed)
He presents today with his mother to discuss hammertoes and painful calluses.  His mother goes on to tell me that she needs to have her cataracts repaired and her son needs to have a septal repair before having his feet done.  Objective: Vital signs are stable alert oriented x3 pulses are palpable. Hammertoe deformities bilaterally are going to result in necessity for surgery. Distal clavi are noted. No skin breakdown is noted. Pulses remain strong and palpable.  Assessment: Pain in limb secondary to hammertoe deformities bilateral and distal clavi.  Plan: Debridement of distal clavus and discussed the need for surgical intervention consisting of straightening the toes and screw fixation.

## 2020-02-14 DIAGNOSIS — J3489 Other specified disorders of nose and nasal sinuses: Secondary | ICD-10-CM | POA: Diagnosis not present

## 2020-02-14 DIAGNOSIS — J342 Deviated nasal septum: Secondary | ICD-10-CM | POA: Diagnosis not present

## 2020-02-15 ENCOUNTER — Other Ambulatory Visit: Payer: Self-pay

## 2020-02-15 ENCOUNTER — Encounter: Payer: Self-pay | Admitting: Family Medicine

## 2020-02-15 ENCOUNTER — Ambulatory Visit (INDEPENDENT_AMBULATORY_CARE_PROVIDER_SITE_OTHER): Payer: Medicare Other | Admitting: Family Medicine

## 2020-02-15 VITALS — BP 111/80 | HR 71 | Temp 98.2°F | Resp 20 | Ht 74.0 in | Wt 307.6 lb

## 2020-02-15 DIAGNOSIS — R0981 Nasal congestion: Secondary | ICD-10-CM

## 2020-02-15 DIAGNOSIS — R5381 Other malaise: Secondary | ICD-10-CM | POA: Diagnosis not present

## 2020-02-15 DIAGNOSIS — R0602 Shortness of breath: Secondary | ICD-10-CM | POA: Diagnosis not present

## 2020-02-15 NOTE — Patient Instructions (Addendum)
As we discussed, likely having some intermittent shortness of breath due to nasal congestion and mouth breathing, along with some deconditioning and Mario Silva's weight.  I would encourage you to keep increasing Mario Silva's exercise and look into a mask that Mario Silva does not find as restrictive as the current facial mask he is wearing when he goes out to the stores.  [employer] has masks that have ear loops and a large opening on the bottom for people that have difficulty with the traditional mask.  We have performed an EKG in clinic today that is within normal limits.  As we discussed, we can always place a referral to pulmonary for pulmonary function testing, if you decide you would want this, please let me know and I will place a referral.  We will plan to see you back in 2 months for follow up on your prediabetes and depression/anxiety  You will receive a survey after today's visit either digitally by e-mail or paper by [place] mail. Your experiences and feedback matter to [place].  Please respond so we know how we are doing as we provide care for you.  Call us with any questions/concerns/needs.  It is my goal to be available to you for your health concerns.  Thanks for choosing me to be a partner in your healthcare needs!  Harlin Rain, FNP-C Family Nurse Practitioner [facility] Phone: [phone]

## 2020-02-15 NOTE — Assessment & Plan Note (Signed)
See SOB A/P

## 2020-02-15 NOTE — Assessment & Plan Note (Addendum)
Mother reports has met with ENT locally and is being referred to [facility] ENT for nasal septum reconstruction surgery.  See SOB A/P

## 2020-02-15 NOTE — Progress Notes (Signed)
Subjective:    Patient ID: Mario Silva, male    DOB: [birth year], 54 y.o.   MRN: [id number]  Mario Silva is a 54 y.o. male presenting on 02/15/2020 for Shortness of Breath (pt was seen by the ENT on yesterday and was told he need to f/u with PCP to make sure that his breathing issues is not related to his heart or lung issue. )   HPI  Patient presents to clinic with his Mother today for follow up from seeing his ENT provider.  Reports went to his ENT provider, they had concerns for elevated blood pressure readings, and some shortness of breath.  Mario Silva reports that the ENT provider requested we see Mario Silva to make sure his shortness of breath is not related to his heart or lungs.    Reports shortness of breath increases with wearing a mask, that once he removes his mask his shortness of breath improves.  Has had a 50lb weight gain, reported by Mother, since the beginning of COVID and some deconditioning with living in his group home.  Has been working on exercising and decreasing caloric intake and reports a 2lb weight loss over the past month.  Denies any chest pain, SOB at rest, palpitations, diaphoresis, numbness, tingling, weakness, fatigue, orthopnea, or peripheral swelling.  Denies DOE.  States can wear a mask when in [place], but after about 30 minutes needs to leave the store to take his mask off and feels better.    Depression screen [facility] 2/9 12/11/2019 09/12/2019 07/03/2019  Decreased Interest 0 0 1  Down, Depressed, Hopeless 1 0 0  PHQ - 2 Score 1 0 1  Altered sleeping - - 1  Tired, decreased energy - - 1  Change in appetite - - 0  Feeling bad or failure about yourself  - - 0  Trouble concentrating - - 1  Moving slowly or fidgety/restless - - 0  Suicidal thoughts - - 0  PHQ-9 Score - - 4  Difficult doing work/chores - - Not difficult at all    Social History   Tobacco Use  . Smoking status: Never Smoker  . Smokeless tobacco: Never Used  Vaping Use  . Vaping Use: Never  used  Substance Use Topics  . Alcohol use: No  . Drug use: No    Review of Systems  Constitutional: Negative.   HENT: Positive for congestion. Negative for dental problem, drooling, ear discharge, ear pain, facial swelling, hearing loss, mouth sores, nosebleeds, postnasal drip, rhinorrhea, sinus pressure, sinus pain, sneezing, sore throat, tinnitus, trouble swallowing and voice change.   Eyes: Negative.   Respiratory: Negative.   Cardiovascular: Negative.   Gastrointestinal: Negative.   Endocrine: Negative.   Genitourinary: Negative.   Musculoskeletal: Negative.   Skin: Negative.   Allergic/Immunologic: Negative.   Neurological: Negative.   Hematological: Negative.   Psychiatric/Behavioral: Negative.    Per HPI unless specifically indicated above     Objective:    BP 111/80 (BP Location: Right Arm, Patient Position: Sitting, Cuff Size: Large)   Pulse 71   Temp 98.2 F (36.8 C) (Oral)   Resp 20   Ht '6\' 2"'  (1.88 m)   Wt (!) 307 lb 9.6 oz (139.5 kg)   SpO2 97%   BMI 39.49 kg/m   Wt Readings from Last 3 Encounters:  02/15/20 (!) 307 lb 9.6 oz (139.5 kg)  01/16/20 (!) 309 lb 12.8 oz (140.5 kg)  01/04/20 (!) 308 lb 6.4 oz (139.9 kg)  Physical Exam Vitals reviewed.  Constitutional:      General: He is not in acute distress.    Appearance: Normal appearance. He is well-developed and well-groomed. He is obese. He is not ill-appearing or toxic-appearing.  HENT:     Head: Normocephalic and atraumatic.     Nose:     Comments: Mario Silva is in place, covering mouth and nose. Eyes:     General:        Right eye: No discharge.        Left eye: No discharge.     Extraocular Movements: Extraocular movements intact.     Conjunctiva/sclera: Conjunctivae normal.     Pupils: Pupils are equal, round, and reactive to light.  Cardiovascular:     Rate and Rhythm: Normal rate and regular rhythm.     Pulses: Normal pulses.     Heart sounds: Normal heart sounds. No murmur heard.  No  friction rub. No gallop.   Pulmonary:     Effort: Pulmonary effort is normal. No respiratory distress.     Breath sounds: Normal breath sounds.  Musculoskeletal:     Right lower leg: No edema.     Left lower leg: No edema.  Skin:    General: Skin is warm and dry.     Capillary Refill: Capillary refill takes less than 2 seconds.  Neurological:     General: No focal deficit present.     Mental Status: He is alert and oriented to person, place, and time.  Psychiatric:        Attention and Perception: Attention and perception normal.        Mood and Affect: Mood and affect normal.        Speech: Speech normal.        Behavior: Behavior normal. Behavior is cooperative.        Thought Content: Thought content normal.        Cognition and Memory: Cognition and memory normal.    Results for orders placed or performed in visit on 09/12/19  CBC with Differential  Result Value Ref Range   WBC 6.3 3.8 - 10.8 Thousand/uL   RBC 5.18 4.20 - 5.80 Million/uL   Hemoglobin 15.1 13.2 - 17.1 g/dL   HCT 45.2 38 - 50 %   MCV 87.3 80.0 - 100.0 fL   MCH 29.2 27.0 - 33.0 pg   MCHC 33.4 32.0 - 36.0 g/dL   RDW 12.8 11.0 - 15.0 %   Platelets 240 140 - 400 Thousand/uL   MPV 10.4 7.5 - 12.5 fL   Neutro Abs 2,930 1,500 - 7,800 cells/uL   Lymphs Abs 2,545 850 - 3,900 cells/uL   Absolute Monocytes 680 200 - 950 cells/uL   Eosinophils Absolute 107 15 - 500 cells/uL   Basophils Absolute 38 0 - 200 cells/uL   Neutrophils Relative % 46.5 %   Total Lymphocyte 40.4 %   Monocytes Relative 10.8 %   Eosinophils Relative 1.7 %   Basophils Relative 0.6 %  COMPLETE METABOLIC PANEL WITH GFR  Result Value Ref Range   Glucose, Bld 80 65 - 99 mg/dL   BUN 12 7 - 25 mg/dL   Creat 0.81 0.70 - 1.33 mg/dL   GFR, Est Non African American 101 > OR = 60 mL/min/1.9m   GFR, Est African American 117 > OR = 60 mL/min/1.758m  BUN/Creatinine Ratio NOT APPLICABLE 6 - 22 (calc)   Sodium 144 135 - 146 mmol/L   Potassium 4.5  3.5 -  5.3 mmol/L   Chloride 108 98 - 110 mmol/L   CO2 26 20 - 32 mmol/L   Calcium 9.4 8.6 - 10.3 mg/dL   Total Protein 6.6 6.1 - 8.1 g/dL   Albumin 4.2 3.6 - 5.1 g/dL   Globulin 2.4 1.9 - 3.7 g/dL (calc)   AG Ratio 1.8 1.0 - 2.5 (calc)   Total Bilirubin 0.4 0.2 - 1.2 mg/dL   Alkaline phosphatase (APISO) 61 35 - 144 U/L   AST 24 10 - 35 U/L   ALT 28 9 - 46 U/L  Lipid Profile  Result Value Ref Range   Cholesterol 182 <200 mg/dL   HDL 41 > OR = 40 mg/dL   Triglycerides 174 (H) <150 mg/dL   LDL Cholesterol (Calc) 112 (H) mg/dL (calc)   Total CHOL/HDL Ratio 4.4 <5.0 (calc)   Non-HDL Cholesterol (Calc) 141 (H) <130 mg/dL (calc)  HgB A1c  Result Value Ref Range   Hgb A1c MFr Bld 5.2 <5.7 % of total Hgb   Mean Plasma Glucose 103 (calc)   eAG (mmol/L) 5.7 (calc)  Thyroid Panel With TSH  Result Value Ref Range   T3 Uptake 31 22 - 35 %   T4, Total 8.0 4.9 - 10.5 mcg/dL   Free Thyroxine Index 2.5 1.4 - 3.8   TSH 0.81 0.40 - 4.50 mIU/L      Assessment & Plan:   Problem List Items Addressed This Visit      Other   Physical deconditioning    See SOB A/P      Nasal congestion    Mother reports has met with ENT locally and is being referred to [facility] ENT for nasal septum reconstruction surgery.  See SOB A/P      Shortness of breath - Primary    SOB with mask wearing secondary to nasal congestion, physical deconditioning and BMI 39.49%.  Discussed with Mother, different options for masks that patient can wear out in public, such as ones that loop around the ears and are open on the bottom (such as the bandana masks), that may allow for more opportunity for breathing and not feeling SOB.  Encouraged to increase physical exercise and decrease caloric intake.    Discussed referral to pulmonary if having continued symptoms of SOB, for pulmonary function testing.  Mother verbalized understanding and requesting to defer at this time.  Reports only symptomatic when wearing a mask for > 30  minutes.  Plan: 1. POCT EKG completed in clinic, showing sinus rhythm, VR 68, PR 168, QRS 106, QTc 412.  No acute ST elevations or depressions. 2. Begin using new mask with larger opening at base 3. RTC if having shortness of breath with new mask, with exertion, or at rest 4. Will defer pulmonary referral now, per mother's request      Relevant Orders   EKG 12-Lead      No orders of the defined types were placed in this encounter.     Follow up plan: Return in about 2 months (around 04/16/2020) for Prediabetes, Depression/Anxiety f/u.   Harlin Rain, [place] Family Nurse Practitioner [facility] 02/15/2020, 12:24 PM

## 2020-02-15 NOTE — Assessment & Plan Note (Signed)
SOB with mask wearing secondary to nasal congestion, physical deconditioning and BMI 39.49%.  Discussed with Mother, different options for masks that patient can wear out in public, such as ones that loop around the ears and are open on the bottom (such as the bandana masks), that may allow for more opportunity for breathing and not feeling SOB.  Encouraged to increase physical exercise and decrease caloric intake.    Discussed referral to pulmonary if having continued symptoms of SOB, for pulmonary function testing.  Mother verbalized understanding and requesting to defer at this time.  Reports only symptomatic when wearing a mask for > 30 minutes.  Plan: 1. POCT EKG completed in clinic, showing sinus rhythm, VR 68, PR 168, QRS 106, QTc 412.  No acute ST elevations or depressions. 2. Begin using new mask with larger opening at base 3. RTC if having shortness of breath with new mask, with exertion, or at rest 4. Will defer pulmonary referral now, per mother's request

## 2020-02-19 ENCOUNTER — Ambulatory Visit (INDEPENDENT_AMBULATORY_CARE_PROVIDER_SITE_OTHER): Payer: Medicare Other | Admitting: Dermatology

## 2020-02-19 ENCOUNTER — Other Ambulatory Visit: Payer: Self-pay

## 2020-02-19 DIAGNOSIS — L570 Actinic keratosis: Secondary | ICD-10-CM

## 2020-02-19 DIAGNOSIS — L578 Other skin changes due to chronic exposure to nonionizing radiation: Secondary | ICD-10-CM

## 2020-02-19 MED ORDER — FLUOROURACIL 5 % EX CREA: TOPICAL_CREAM | Freq: Two times a day (BID) | CUTANEOUS | 0 refills | Status: DC

## 2020-02-19 MED ORDER — CALCIPOTRIENE 0.005 % EX CREA: TOPICAL_CREAM | Freq: Two times a day (BID) | CUTANEOUS | 0 refills | Status: DC

## 2020-02-19 NOTE — Progress Notes (Signed)
   Follow-Up Visit   Subjective  Mario Silva is a 54 y.o. male who presents for the following: Follow-up.  Patient here today for biopsy proven AK at the right lateral eyebrow.  The following portions of the chart were reviewed this encounter and updated as appropriate:  Tobacco  Allergies  Meds  Problems  Med Hx  Surg Hx  Fam Hx      Review of Systems:  No other skin or systemic complaints except as noted in HPI or Assessment and Plan.  Objective  Well appearing patient in no apparent distress; mood and affect are within normal limits.  A focused examination was performed including face, ears, neck. Relevant physical exam findings are noted in the Assessment and Plan.  Objective  Right Lateral Eyebrow: Erythematous thin papules/macules with gritty scale.    Assessment & Plan  AK (actinic keratosis) Right Lateral Eyebrow  Biopsy proven, treated with Ln2 twice prior to biopsy, twice following biopsy. Patient defers Ln2 today as he did not tolerate it well previously.  Discussed punch biopsy vs topical chemotherapy  Start 5FU/calcipotriene twice daily for 4-7 days to top of right ear and right eyebrow.  Reviewed expected reaction.  Consider punch biopsy of right eyebrow lesion on follow-up if not resolved.   fluorouracil (EFUDEX) 5 % cream - Right Lateral Eyebrow  calcipotriene (DOVONOX) 0.005 % cream - Right Lateral Eyebrow  Actinic Damage - diffuse scaly erythematous macules with underlying dyspigmentation - Recommend daily broad spectrum sunscreen SPF 30+ to sun-exposed areas, reapply every 2 hours as needed.  - Call for new or changing lesions.  Return in about 2 weeks (around 03/04/2020) for AK follow up.  Graciella Belton, RMA, am acting as scribe for Forest Gleason, MD .  Documentation: I have reviewed the above documentation for accuracy and completeness, and I agree with the above.  Forest Gleason, MD

## 2020-02-19 NOTE — Patient Instructions (Addendum)
Your medications have been sent to [facility] and will be mailed to you after you call them and confirm your information with them.   [facility] [phone] [place], [place] [address]  Start fluorouracil cream thin layer followed with calcipotriene cream thin layer twice daily to area at right eyebrow and top of right ear.

## 2020-02-26 ENCOUNTER — Ambulatory Visit: Payer: Medicare Other | Admitting: Dermatology

## 2020-02-26 ENCOUNTER — Encounter: Payer: Self-pay | Admitting: Dermatology

## 2020-02-28 ENCOUNTER — Other Ambulatory Visit: Payer: Self-pay | Admitting: Family Medicine

## 2020-02-28 DIAGNOSIS — F3341 Major depressive disorder, recurrent, in partial remission: Secondary | ICD-10-CM

## 2020-02-28 NOTE — Telephone Encounter (Signed)
Requested  medications are  due for refill today yes  Requested medications are on the active medication list yes  Last refill 10/2019  Last visit March 2021  Future visit scheduled Yes Oct 2021  Notes to clinic Not Delegated

## 2020-03-04 ENCOUNTER — Ambulatory Visit (INDEPENDENT_AMBULATORY_CARE_PROVIDER_SITE_OTHER): Payer: Medicare Other | Admitting: Dermatology

## 2020-03-04 ENCOUNTER — Other Ambulatory Visit: Payer: Self-pay

## 2020-03-04 DIAGNOSIS — L570 Actinic keratosis: Secondary | ICD-10-CM

## 2020-03-04 NOTE — Progress Notes (Signed)
   Follow-Up Visit   Subjective  Mario Silva is a 54 y.o. male who presents for the following: Actinic Keratosis (R eyebrow, R ear, 2wk f/u, Efudex/Calcipotriene bid x 7 days R eyebrow, R ear txted ~ 4days).   The following portions of the chart were reviewed this encounter and updated as appropriate:    Pt with his mother today.  Review of Systems:  No other skin or systemic complaints except as noted in HPI or Assessment and Plan.  Objective  Well appearing patient in no apparent distress; mood and affect are within normal limits.  A focused examination was performed including face, ears. Relevant physical exam findings are noted in the Assessment and Plan.  Objective  R eyebrow: Erythematous crusted patch R eyebrow R ear, no reaction noted  Images     Assessment & Plan  AK (actinic keratosis) R eyebrow  With some reaction from 5FU/Calcipotriene to R eyebrow, no reaction on R ear  Restart 5FU/Calcipotriene to R eyebrow bid x 1 more week Cont 5FU/Calcipotriene to R ear bid x 1 more week Then let heal 2 weeks.  Recheck on f/up.  May apply vaseline or neosporin in middle of day during treatment week, then prn while healing  Other Related Medications fluorouracil (EFUDEX) 5 % cream calcipotriene (DOVONOX) 0.005 % cream  Return in about 3 weeks (around 03/25/2020) for AK f/u with Dr. Laurence Ferrari.  I, Othelia Pulling, RMA, am acting as scribe for Brendolyn Patty, MD .  Documentation: I have reviewed the above documentation for accuracy and completeness, and I agree with the above.  Brendolyn Patty MD

## 2020-03-13 ENCOUNTER — Telehealth: Payer: Self-pay

## 2020-03-13 NOTE — Telephone Encounter (Signed)
To Whom It May Concern,  Please apply vaseline three to four times a day to the areas that were previously treated with fluorouracil cream at his right eyebrow and his ear. Please continue the vaseline 3-4 times a day until his follow-up appointment with dermatology.  Sincerely,   Dr. Laurence Ferrari ([place]

## 2020-03-14 ENCOUNTER — Ambulatory Visit: Payer: Medicare Other | Admitting: Family Medicine

## 2020-03-26 ENCOUNTER — Ambulatory Visit: Payer: Medicare Other | Admitting: Dermatology

## 2020-04-16 ENCOUNTER — Ambulatory Visit: Payer: Medicare Other | Admitting: Family Medicine

## 2020-04-16 DIAGNOSIS — G4733 Obstructive sleep apnea (adult) (pediatric): Secondary | ICD-10-CM | POA: Diagnosis not present

## 2020-04-25 ENCOUNTER — Encounter: Payer: Self-pay | Admitting: Family Medicine

## 2020-04-25 ENCOUNTER — Other Ambulatory Visit: Payer: Self-pay

## 2020-04-25 ENCOUNTER — Ambulatory Visit (INDEPENDENT_AMBULATORY_CARE_PROVIDER_SITE_OTHER): Payer: Medicare Other | Admitting: Family Medicine

## 2020-04-25 VITALS — BP 119/82 | HR 67 | Temp 98.7°F | Resp 18 | Ht 74.0 in | Wt 307.2 lb

## 2020-04-25 DIAGNOSIS — K219 Gastro-esophageal reflux disease without esophagitis: Secondary | ICD-10-CM

## 2020-04-25 DIAGNOSIS — J302 Other seasonal allergic rhinitis: Secondary | ICD-10-CM | POA: Diagnosis not present

## 2020-04-25 DIAGNOSIS — F3341 Major depressive disorder, recurrent, in partial remission: Secondary | ICD-10-CM | POA: Diagnosis not present

## 2020-04-25 DIAGNOSIS — R7303 Prediabetes: Secondary | ICD-10-CM

## 2020-04-25 DIAGNOSIS — R42 Dizziness and giddiness: Secondary | ICD-10-CM | POA: Diagnosis not present

## 2020-04-25 LAB — POCT GLYCOSYLATED HEMOGLOBIN (HGB A1C): Hemoglobin A1C: 5 % (ref 4.0–5.6)

## 2020-04-25 MED ORDER — MECLIZINE HCL 25 MG PO TABS: 25.0000 mg | ORAL_TABLET | Freq: Three times a day (TID) | ORAL | 1 refills | Status: DC | PRN

## 2020-04-25 MED ORDER — CETIRIZINE HCL 10 MG PO TABS: 10.0000 mg | ORAL_TABLET | Freq: Every day | ORAL | 3 refills | Status: DC

## 2020-04-25 MED ORDER — FLUTICASONE PROPIONATE 50 MCG/ACT NA SUSP: 2.0000 | Freq: Every day | NASAL | 6 refills | Status: DC

## 2020-04-25 MED ORDER — ARIPIPRAZOLE 5 MG PO TABS: ORAL_TABLET | ORAL | 1 refills | Status: DC

## 2020-04-25 MED ORDER — PANTOPRAZOLE SODIUM 40 MG PO TBEC: 40.0000 mg | DELAYED_RELEASE_TABLET | Freq: Every day | ORAL | 2 refills | Status: DC

## 2020-04-25 NOTE — Assessment & Plan Note (Signed)
A1C 5.0% today, previously 5.2% on 09/12/2019.  Encouraged continued healthy diet and lifestyle modifications, to continue daily chores at group home.  Plan: 1. Continue healthy lifestyle modifications and increased activity 2. RTC in 6 months

## 2020-04-25 NOTE — Patient Instructions (Signed)
Continue all medications as directed  We will plan to see you back in 6 months for prediabetes and anxiety follow up visit  You will receive a survey after today's visit either digitally by e-mail or paper by [place] mail. Your experiences and feedback matter to [place].  Please respond so we know how we are doing as we provide care for you.  Call us with any questions/concerns/needs.  It is my goal to be available to you for your health concerns.  Thanks for choosing me to be a partner in your healthcare needs!  Harlin Rain, FNP-C Family Nurse Practitioner [facility] Phone: [phone]

## 2020-04-25 NOTE — Assessment & Plan Note (Signed)
Stable and controlled anxiety and depression with taking abilify 5mg  daily.  Has a prescription for lorazepam that was last filled on 12/07/2019 and has not been needing this daily.  Will continue on current prescriptions.  Plan: 1. Continue medications as prescribed 2. Continue physical activity and healthy lifestyle 3. We will see you back in 6 months

## 2020-04-25 NOTE — Assessment & Plan Note (Signed)
Stable and well controlled with cetirizine 10mg  daily and fluticasone 2 sprays daily.  Will send in refills as requested

## 2020-04-25 NOTE — Progress Notes (Signed)
Subjective:    Patient ID: Mario Silva, male    DOB: 1965-11-15, 54 y.o.   MRN: [id number]  Mario Silva is a 54 y.o. male presenting on 04/25/2020 for Prediabetes and Anxiety   HPI  [name] presents today with group home leader, Vicente Serene.  [name] reports he has been doing well with all of his medications, has been increasing his responsibilities and chores at the group home and watching what he is eating.  He has no acute concerns today.  Depression screen [facility] 2/9 12/11/2019 09/12/2019 07/03/2019  Decreased Interest 0 0 1  Down, Depressed, Hopeless 1 0 0  PHQ - 2 Score 1 0 1  Altered sleeping - - 1  Tired, decreased energy - - 1  Change in appetite - - 0  Feeling bad or failure about yourself  - - 0  Trouble concentrating - - 1  Moving slowly or fidgety/restless - - 0  Suicidal thoughts - - 0  PHQ-9 Score - - 4  Difficult doing work/chores - - Not difficult at all    Social History   Tobacco Use  . Smoking status: Never Smoker  . Smokeless tobacco: Never Used  Vaping Use  . Vaping Use: Never used  Substance Use Topics  . Alcohol use: No  . Drug use: No    Review of Systems  Constitutional: Negative.   HENT: Negative.   Eyes: Negative.   Respiratory: Negative.   Cardiovascular: Negative.   Gastrointestinal: Negative.   Endocrine: Negative.   Genitourinary: Negative.   Musculoskeletal: Negative.   Skin: Negative.   Allergic/Immunologic: Negative.   Neurological: Negative.   Hematological: Negative.   Psychiatric/Behavioral: Negative.    Per HPI unless specifically indicated above     Objective:    BP 119/82 (BP Location: Right Arm, Patient Position: Sitting, Cuff Size: Large)   Pulse 67   Temp 98.7 F (37.1 C) (Oral)   Resp 18   Ht 6\' 2"  (1.88 m)   Wt (!) 307 lb 3.2 oz (139.3 kg)   SpO2 97%   BMI 39.44 kg/m   Wt Readings from Last 3 Encounters:  04/25/20 (!) 307 lb 3.2 oz (139.3 kg)  02/15/20 (!) 307 lb 9.6 oz (139.5 kg)  01/16/20 (!) 309 lb  12.8 oz (140.5 kg)    Physical Exam Vitals and nursing note reviewed.  Constitutional:      General: He is not in acute distress.    Appearance: Normal appearance. He is well-developed and well-groomed. He is obese. He is not ill-appearing or toxic-appearing.  HENT:     Head: Normocephalic and atraumatic.     Nose:     Comments: Lizbeth Bark is in place, covering mouth and nose. Eyes:     General:        Right eye: No discharge.        Left eye: No discharge.     Extraocular Movements: Extraocular movements intact.     Conjunctiva/sclera: Conjunctivae normal.     Pupils: Pupils are equal, round, and reactive to light.  Cardiovascular:     Rate and Rhythm: Normal rate and regular rhythm.     Pulses: Normal pulses.     Heart sounds: Normal heart sounds. No murmur heard.  No friction rub. No gallop.   Pulmonary:     Effort: Pulmonary effort is normal. No respiratory distress.     Breath sounds: Normal breath sounds.  Musculoskeletal:     Right lower leg: No edema.  Left lower leg: No edema.  Skin:    General: Skin is warm and dry.     Capillary Refill: Capillary refill takes less than 2 seconds.  Neurological:     General: No focal deficit present.     Mental Status: He is alert and oriented to person, place, and time.  Psychiatric:        Attention and Perception: Attention and perception normal.        Mood and Affect: Mood and affect normal.        Speech: Speech normal.        Behavior: Behavior normal. Behavior is cooperative.        Thought Content: Thought content normal.        Cognition and Memory: Cognition and memory normal.    Results for orders placed or performed in visit on 04/25/20  POCT glycosylated hemoglobin (Hb A1C)  Result Value Ref Range   Hemoglobin A1C 5.0 4.0 - 5.6 %   HbA1c POC (<> result, manual entry)     HbA1c, POC (prediabetic range)     HbA1c, POC (controlled diabetic range)        Assessment & Plan:   Problem List Items Addressed This  Visit      Digestive   Gastroesophageal reflux disease   Relevant Medications   meclizine (ANTIVERT) 25 MG tablet   pantoprazole (PROTONIX) 40 MG tablet     Other   Recurrent major depressive disorder, in partial remission (HCC)    Stable and controlled anxiety and depression with taking abilify 5mg  daily.  Has a prescription for lorazepam that was last filled on 12/07/2019 and has not been needing this daily.  Will continue on current prescriptions.  Plan: 1. Continue medications as prescribed 2. Continue physical activity and healthy lifestyle 3. We will see you back in 6 months      Relevant Medications   ARIPiprazole (ABILIFY) 5 MG tablet   Seasonal allergies    Stable and well controlled with cetirizine 10mg  daily and fluticasone 2 sprays daily.  Will send in refills as requested      Relevant Medications   cetirizine (ZYRTEC) 10 MG tablet   fluticasone (FLONASE) 50 MCG/ACT nasal spray   Prediabetes - Primary    A1C 5.0% today, previously 5.2% on 09/12/2019.  Encouraged continued healthy diet and lifestyle modifications, to continue daily chores at group home.  Plan: 1. Continue healthy lifestyle modifications and increased activity 2. RTC in 6 months      Relevant Orders   POCT glycosylated hemoglobin (Hb A1C) (Completed)    Other Visit Diagnoses    Dizziness       Relevant Medications   meclizine (ANTIVERT) 25 MG tablet      Meds ordered this encounter  Medications  . ARIPiprazole (ABILIFY) 5 MG tablet    Sig: Take 1 tablet daily    Dispense:  90 tablet    Refill:  1  . cetirizine (ZYRTEC) 10 MG tablet    Sig: Take 1 tablet (10 mg total) by mouth daily.    Dispense:  90 tablet    Refill:  3  . fluticasone (FLONASE) 50 MCG/ACT nasal spray    Sig: Place 2 sprays into both nostrils daily.    Dispense:  16 g    Refill:  6  . meclizine (ANTIVERT) 25 MG tablet    Sig: Take 1 tablet (25 mg total) by mouth 3 (three) times daily as needed for dizziness.  Dispense:  90 tablet    Refill:  1  . pantoprazole (PROTONIX) 40 MG tablet    Sig: Take 1 tablet (40 mg total) by mouth daily before breakfast.    Dispense:  30 tablet    Refill:  2    Follow up plan: Return in about 6 months (around 10/24/2020) for Prediabetes and anxiety f/u.   Harlin Rain, [place] [facility] Nurse Practitioner [facility] 04/25/2020, 8:29 AM

## 2020-05-05 ENCOUNTER — Ambulatory Visit (INDEPENDENT_AMBULATORY_CARE_PROVIDER_SITE_OTHER): Payer: Medicare Other | Admitting: Podiatry

## 2020-05-05 ENCOUNTER — Other Ambulatory Visit: Payer: Self-pay

## 2020-05-05 ENCOUNTER — Encounter: Payer: Self-pay | Admitting: Podiatry

## 2020-05-05 DIAGNOSIS — M79674 Pain in right toe(s): Secondary | ICD-10-CM

## 2020-05-05 DIAGNOSIS — I872 Venous insufficiency (chronic) (peripheral): Secondary | ICD-10-CM

## 2020-05-05 DIAGNOSIS — B351 Tinea unguium: Secondary | ICD-10-CM | POA: Diagnosis not present

## 2020-05-05 DIAGNOSIS — M79675 Pain in left toe(s): Secondary | ICD-10-CM | POA: Insufficient documentation

## 2020-05-05 NOTE — Progress Notes (Signed)
This patient returns to my office for at risk foot care.  This patient requires this care by a professional since this patient will be at risk due to having venous stasis dermatitis  B/L. This patient is accompanied by caregiver.  This patient is unable to cut nails himself since the patient cannot reach his nails.These nails are painful walking and wearing shoes.  This patient presents for at risk foot care today.  General Appearance  Alert, conversant and in no acute stress.  Vascular  Dorsalis pedis and posterior tibial  pulses are palpable  bilaterally.  Capillary return is within normal limits  bilaterally. Temperature is within normal limits  bilaterally.  Neurologic  Senn-Weinstein monofilament wire test within normal limits  bilaterally. Muscle power within normal limits bilaterally.  Nails Thick disfigured discolored nails with subungual debris  from hallux to fifth toes bilaterally. No evidence of bacterial infection or drainage bilaterally.  Orthopedic  No limitations of motion  feet .  No crepitus or effusions noted.  Hallux malleus  B/L.  Hammer toes  B/L.  Skin  normotropic skin with no porokeratosis noted bilaterally.  No signs of infections or ulcers noted.   Pinch callus  B/L asymptomatic.  Onychomycosis  Pain in right toes  Pain in left toes  Consent was obtained for treatment procedures.   Mechanical debridement of nails 1-5  bilaterally performed with a nail nipper.  Filed with dremel without incident.    Return office visit   3 months                  Told patient to return for periodic foot care and evaluation due to potential at risk complications.   Jaylanie Boschee DPM  

## 2020-05-13 ENCOUNTER — Encounter: Payer: Self-pay | Admitting: Dermatology

## 2020-05-13 ENCOUNTER — Other Ambulatory Visit: Payer: Self-pay

## 2020-05-13 ENCOUNTER — Ambulatory Visit (INDEPENDENT_AMBULATORY_CARE_PROVIDER_SITE_OTHER): Payer: Medicare Other | Admitting: Dermatology

## 2020-05-13 DIAGNOSIS — L570 Actinic keratosis: Secondary | ICD-10-CM

## 2020-05-13 NOTE — Progress Notes (Signed)
° °  Follow-Up Visit   Subjective  Mario Silva is a 54 y.o. male who presents for the following: Follow-up (Patient here today for follow up on biopsy proven AK follow up at right eyebrow. ).  Spot has been treated with LN2 multiple times and most recently with 5FU/calcipotriene, finishing treatment about 1 month ago. Since finishing treatment with topical creams he has been using vaseline to the area.   A spot at right ear was also treated with 5FU/calcipotriene but did not get red and crusty, stayed the same.  The following portions of the chart were reviewed this encounter and updated as appropriate:  Tobacco   Allergies   Meds   Problems   Med Hx   Surg Hx   Fam Hx       Review of Systems:  No other skin or systemic complaints except as noted in HPI or Assessment and Plan.  Objective  Well appearing patient in no apparent distress; mood and affect are within normal limits.  A focused examination was performed including face, ears. Relevant physical exam findings are noted in the Assessment and Plan.  Objective  Right Eyebrow: Ear is clear, right eyebrow with thin scaly erythematous patch   Assessment & Plan  AK (actinic keratosis) Right Eyebrow  S/p 5FU/calcipotriene with good response Crusting removed from eyebrow today, will recheck in 1 month to assess clearance since unable to assess clearance today at eyebrow due to crusting  Continue vaseline daily  Other Related Medications fluorouracil (EFUDEX) 5 % cream calcipotriene (DOVONOX) 0.005 % cream  Return in about 4 weeks (around 06/10/2020) for AK follow up.  Graciella Belton, RMA, am acting as scribe for Forest Gleason, MD .  Documentation: I have reviewed the above documentation for accuracy and completeness, and I agree with the above.  Forest Gleason, MD

## 2020-06-12 ENCOUNTER — Ambulatory Visit (INDEPENDENT_AMBULATORY_CARE_PROVIDER_SITE_OTHER): Payer: Medicare Other | Admitting: Dermatology

## 2020-06-12 ENCOUNTER — Other Ambulatory Visit: Payer: Self-pay

## 2020-06-12 ENCOUNTER — Ambulatory Visit: Payer: Medicare Other | Admitting: Dermatology

## 2020-06-12 DIAGNOSIS — D485 Neoplasm of uncertain behavior of skin: Secondary | ICD-10-CM

## 2020-06-12 NOTE — Progress Notes (Signed)
   Follow-Up Visit   Subjective  Mario Silva is a 54 y.o. male who presents for the following: Follow-up (1 mo AK f/u. Rechecking AK at right eyebrow). He notes there is still scale since treatment with 5FU/calcipotriene.   The following portions of the chart were reviewed this encounter and updated as appropriate:  Tobacco  Allergies  Meds  Problems  Med Hx  Surg Hx  Fam Hx      Review of Systems: No other skin or systemic complaints except as noted in HPI or Assessment and Plan.   Objective  Well appearing patient in no apparent distress; mood and affect are within normal limits.  A focused examination was performed including face. Relevant physical exam findings are noted in the Assessment and Plan.  Objective  Right eyebrow: Scaly pink plaque right eyebrow  R/o SCC 3 mm punch Previously biopsied Cryotherapy twice 5 FU/Calcipotriene once Still persistent   Assessment & Plan  Neoplasm of uncertain behavior of skin Right eyebrow  Biopsy proven with verrucous features Status post cryotherapy, twice Status post 5 Fluorouracil/Calcipotriene course with expected reaction Still with residual scaly pink plaque, thin  Pending verbal consent from sister, will plan repeat biopsy (punch biopsy) after holidays to rule out SCC given lack of clearance as expected.  He defers biopsy today due to the holidays.  [place]  Return in about 1 month (around 07/13/2020) for biopsy.   I, Harriett Sine, CMA, am acting as scribe for Forest Gleason, MD.   Documentation: I have reviewed the above documentation for accuracy and completeness, and I agree with the above.  Forest Gleason, MD

## 2020-06-17 ENCOUNTER — Encounter: Payer: Self-pay | Admitting: Dermatology

## 2020-07-16 ENCOUNTER — Encounter: Payer: Self-pay | Admitting: Dermatology

## 2020-07-16 ENCOUNTER — Ambulatory Visit (INDEPENDENT_AMBULATORY_CARE_PROVIDER_SITE_OTHER): Payer: Medicare Other | Admitting: Dermatology

## 2020-07-16 ENCOUNTER — Other Ambulatory Visit: Payer: Self-pay

## 2020-07-16 DIAGNOSIS — D489 Neoplasm of uncertain behavior, unspecified: Secondary | ICD-10-CM

## 2020-07-16 DIAGNOSIS — B079 Viral wart, unspecified: Secondary | ICD-10-CM | POA: Diagnosis not present

## 2020-07-16 DIAGNOSIS — L905 Scar conditions and fibrosis of skin: Secondary | ICD-10-CM | POA: Diagnosis not present

## 2020-07-16 NOTE — Patient Instructions (Signed)
Biopsy Wound Care Instructions  1. Leave the original bandage on for 24 hours if possible.  If the bandage becomes soaked or soiled before that time, it is OK to remove it and examine the wound.  A small amount of post-operative bleeding is normal.  If excessive bleeding occurs, remove the bandage, place gauze over the site and apply continuous pressure (no peeking) over the area for 30 minutes. If this does not work, please call our clinic as soon as possible or page your doctor if it is after hours.   2. Once a day, cleanse the wound with soap and water. It is fine to shower. If a thick crust develops you may use a Q-tip dipped into dilute hydrogen peroxide (mix 1:1 with water) to dissolve it.  Hydrogen peroxide can slow the healing process, so use it only as needed.    3. After washing, apply petroleum jelly (Vaseline) or an antibiotic ointment if your doctor prescribed one for you, followed by a bandage.    4. For best healing, the wound should be covered with a layer of ointment at all times. If you are not able to keep the area covered with a bandage to hold the ointment in place, this may mean re-applying the ointment several times a day.  Continue this wound care until the wound has healed and is no longer open.   Itching and mild discomfort is normal during the healing process. However, if you develop pain or severe itching, please call our office.   If you have any discomfort, you can take Tylenol (acetaminophen) or ibuprofen as directed on the bottle. (Please do not take these if you have an allergy to them or cannot take them for another reason).  Some redness, tenderness and white or yellow material in the wound is normal healing.  If the area becomes very sore and red, or develops a thick yellow-green material (pus), it may be infected; please notify us.    If you have stitches, return to clinic as directed to have the stitches removed. You will continue wound care for 2-3 days after  the stitches are removed.   Wound healing continues for up to one year following surgery. It is not unusual to experience pain in the scar from time to time during the interval.  If the pain becomes severe or the scar thickens, you should notify the office.    A slight amount of redness in a scar is expected for the first six months.  After six months, the redness will fade and the scar will soften and fade.  The color difference becomes less noticeable with time.  If there are any problems, return for a post-op surgery check at your earliest convenience.  To improve the appearance of the scar, you can use silicone scar gel, cream, or sheets (such as Mederma or Serica) every night for up to one year. These are available over the counter (without a prescription).  Please call our office at [phone] for any questions or concerns.    

## 2020-07-16 NOTE — Progress Notes (Signed)
   Follow-Up Visit   Subjective  Mario Silva is a 55 y.o. male who presents for the following: Follow-up Patient is here today to have area looked at above right eyebrow. Patient states it has been itchy. Previous biopsy showed AK but it has failed treatment with LN2 and 5FU.   The following portions of the chart were reviewed this encounter and updated as appropriate:  Tobacco  Allergies  Meds  Problems  Med Hx  Surg Hx  Fam Hx        Objective  Well appearing patient in no apparent distress; mood and affect are within normal limits.  A focused examination was performed including right eyebrow. Relevant physical exam findings are noted in the Assessment and Plan.   Objective  right eyebrow:  2.5 cm scaly pink plaque right brow  Assessment & Plan  Neoplasm of uncertain behavior right eyebrow  Skin / nail biopsy Type of biopsy: punch   Informed consent: discussed and consent obtained   Timeout: patient name, date of birth, surgical site, and procedure verified   Patient was prepped and draped in usual sterile fashion: Area prepped with isopropyl alcohol. Anesthesia: the lesion was anesthetized in a standard fashion   Anesthetic:  1% lidocaine w/ epinephrine 1-100,000 buffered w/ 8.4% NaHCO3 Punch size:  3 mm Suture size:  5-0 Suture type: nylon   Suture removal (days):  7 Hemostasis achieved with: suture, pressure, aluminum chloride and Gelfoam   Outcome: patient tolerated procedure well   Post-procedure details: wound care instructions given   Post-procedure details comment:  Ointment and small bandage applied.  Additional details:  Mupirocin and a bandage applied  Specimen 1 - Surgical pathology Differential Diagnosis: r/o SCC  Check Margins: No 2.5 cm Scaly pink plaque right brow Has failed to respond to cryotherapy multiple times and 5FU Previous bx [id number]  Previous biopsy showed AK. Has been treated with LN2 and 5-fluorouracil since with appropriate  reaction and has not cleared.  R/o SCC  Patient's sister Mario Silva is healthcare POA and gave permission verbally over phone today to proceed with biopsy. His mother recently passed away.  Mario Silva [id number]  Return in about 3 months (around 10/14/2020) for follow up on right eyebrow.  Return in 1 week for suture removal  I, Ruthell Rummage, CMA, am acting as scribe for Forest Gleason, MD.  Documentation: I have reviewed the above documentation for accuracy and completeness, and I agree with the above.  Forest Gleason, MD

## 2020-07-22 ENCOUNTER — Encounter: Payer: Self-pay | Admitting: Dermatology

## 2020-07-22 ENCOUNTER — Other Ambulatory Visit: Payer: Self-pay

## 2020-07-22 ENCOUNTER — Ambulatory Visit (INDEPENDENT_AMBULATORY_CARE_PROVIDER_SITE_OTHER): Payer: Medicare Other | Admitting: Dermatology

## 2020-07-22 DIAGNOSIS — B078 Other viral warts: Secondary | ICD-10-CM

## 2020-07-22 DIAGNOSIS — L249 Irritant contact dermatitis, unspecified cause: Secondary | ICD-10-CM

## 2020-07-22 MED ORDER — HYDROCORTISONE 2.5 % EX CREA: TOPICAL_CREAM | CUTANEOUS | 0 refills | Status: DC

## 2020-07-22 NOTE — Progress Notes (Signed)
   Follow-Up Visit   Subjective  Mario Silva is a 55 y.o. male who presents for the following: Follow-up (1 week f/u suture removal right eyebrow, biopsy proven Verruca ) and Dermatitis (Rash on the right eyebrow area pink itchy and irritating for several days ).  Caregiver with pt   The following portions of the chart were reviewed this encounter and updated as appropriate:   Tobacco  Allergies  Meds  Problems  Med Hx  Surg Hx  Fam Hx      Review of Systems:  No other skin or systemic complaints except as noted in HPI or Assessment and Plan.  Objective  Well appearing patient in no apparent distress; mood and affect are within normal limits.  A focused examination was performed including face. Relevant physical exam findings are noted in the Assessment and Plan.  Objective  R eyebrow: Biopsy site clean, dry and intact  Objective  R eyebrow: Scaly erythematous plaque right eyebrow   Assessment & Plan  Other viral warts R eyebrow  Biopsy proven Verruca discussed  Plan treatment with 5-fluorouracil cream twice daily for 4-6 weeks once biopsy site completely healed  Encounter for Removal of Sutures - Incision site at the Right eye brow is clean, dry and intact - Wound cleansed, sutures removed, wound cleansed  - Discussed pathology results showing Verruca   - Scars remodel for a full year.   patient can apply over-the-counter silicone scar cream each night to help with scar remodeling if desired. - Patient advised to call with any concerns or if they notice any new or changing lesions.   Irritant contact dermatitis, unspecified trigger R eyebrow  Start Hydrocortisone 2.5% cream apply to right eyebrow twice a day as needed up to 2 weeks then stop 30 gm 0 RF   Topical steroids (such as triamcinolone, fluocinolone, fluocinonide, mometasone, clobetasol, halobetasol, betamethasone, hydrocortisone) can cause thinning and lightening of the skin if they are used for too  long in the same area. Your physician has selected the right strength medicine for your problem and area affected on the body. Please use your medication only as directed by your physician to prevent side effects.    Ordered Medications: hydrocortisone 2.5 % cream  Return in about 1 month (around 08/22/2020).  I, Marye Round, CMA, am acting as scribe for Forest Gleason, MD .  Documentation: I have reviewed the above documentation for accuracy and completeness, and I agree with the above.  Forest Gleason, MD

## 2020-07-22 NOTE — Patient Instructions (Signed)
Topical steroids (such as triamcinolone, fluocinolone, fluocinonide, mometasone, clobetasol, halobetasol, betamethasone, hydrocortisone) can cause thinning and lightening of the skin if they are used for too long in the same area. Your physician has selected the right strength medicine for your problem and area affected on the body. Please use your medication only as directed by your physician to prevent side effects.   . 

## 2020-07-23 ENCOUNTER — Telehealth: Payer: Self-pay

## 2020-07-23 NOTE — Telephone Encounter (Signed)
Patient was advised at follow up bx results showed benign verruca, JS

## 2020-07-30 ENCOUNTER — Telehealth: Payer: Self-pay

## 2020-07-30 NOTE — Telephone Encounter (Signed)
-----   Message from Alfonso Patten, MD sent at 07/29/2020  2:07 PM EST ----- Reviewed results at follow-up with patient but I think we need to tell his sister too since she's healthcare POA, so that's why I sent you guys the to do   ----- Message ----- From: Graciella Belton, CMA Sent: 07/22/2020   5:43 PM EST To: Alfonso Patten, MD, Marye Round, CMA  Did you review at patient's suture removal today? ----- Message ----- From: Alfonso Patten, MD Sent: 07/22/2020  12:09 PM EST To: [facility]  Biopsy right brow VERRUCA VULGARIS, IRRITATED OVERLYING DERMAL SCAR   This is a WART caused by the human papilloma virus. There was no evidence of pre-cancer left or cancer on biopsy.  It is not dangerous but is contagious and can spread to other areas of skin or other people if it is not completely gone.   Once area is completely healed in 1 month, will plan treatment with 5-fluorouracil cream twice a day for 4 to 6 weeks as tolerated. Will check patient in follow-up in 1 month and plan to start treatment at that time.  MAs please call patient's sister (his healthcare power of attorney) and go over result and plan. Thank you!

## 2020-07-30 NOTE — Telephone Encounter (Signed)
Called spoke with [name] pt's sister, discussed biopsy results showed benign Verruca, no precancer, discussed treatment plan with pt's sister

## 2020-08-07 ENCOUNTER — Other Ambulatory Visit: Payer: Self-pay

## 2020-08-07 ENCOUNTER — Ambulatory Visit (INDEPENDENT_AMBULATORY_CARE_PROVIDER_SITE_OTHER): Payer: Medicare Other | Admitting: Podiatry

## 2020-08-07 ENCOUNTER — Encounter: Payer: Self-pay | Admitting: Podiatry

## 2020-08-07 DIAGNOSIS — M79675 Pain in left toe(s): Secondary | ICD-10-CM

## 2020-08-07 DIAGNOSIS — I872 Venous insufficiency (chronic) (peripheral): Secondary | ICD-10-CM | POA: Diagnosis not present

## 2020-08-07 DIAGNOSIS — B351 Tinea unguium: Secondary | ICD-10-CM | POA: Diagnosis not present

## 2020-08-07 DIAGNOSIS — M79674 Pain in right toe(s): Secondary | ICD-10-CM

## 2020-08-07 NOTE — Progress Notes (Signed)
This patient returns to my office for at risk foot care.  This patient requires this care by a professional since this patient will be at risk due to having venous stasis dermatitis  B/L. This patient is accompanied by caregiver.  This patient is unable to cut nails himself since the patient cannot reach his nails.These nails are painful walking and wearing shoes.  This patient presents for at risk foot care today.  General Appearance  Alert, conversant and in no acute stress.  Vascular  Dorsalis pedis and posterior tibial  pulses are palpable  bilaterally.  Capillary return is within normal limits  bilaterally. Temperature is within normal limits  bilaterally.  Neurologic  Senn-Weinstein monofilament wire test within normal limits  bilaterally. Muscle power within normal limits bilaterally.  Nails Thick disfigured discolored nails with subungual debris  from hallux to fifth toes bilaterally. No evidence of bacterial infection or drainage bilaterally.  Orthopedic  No limitations of motion  feet .  No crepitus or effusions noted.  Hallux malleus  B/L.  Hammer toes  B/L.  Skin  normotropic skin with no porokeratosis noted bilaterally.  No signs of infections or ulcers noted.   Pinch callus  B/L asymptomatic.  Onychomycosis  Pain in right toes  Pain in left toes  Consent was obtained for treatment procedures.   Mechanical debridement of nails 1-5  bilaterally performed with a nail nipper.  Filed with dremel without incident.    Return office visit   3 months                  Told patient to return for periodic foot care and evaluation due to potential at risk complications.   Gardiner Barefoot DPM

## 2020-08-27 ENCOUNTER — Ambulatory Visit (INDEPENDENT_AMBULATORY_CARE_PROVIDER_SITE_OTHER): Payer: Medicare Other | Admitting: Dermatology

## 2020-08-27 ENCOUNTER — Encounter: Payer: Self-pay | Admitting: Dermatology

## 2020-08-27 ENCOUNTER — Other Ambulatory Visit: Payer: Self-pay

## 2020-08-27 DIAGNOSIS — B078 Other viral warts: Secondary | ICD-10-CM | POA: Diagnosis not present

## 2020-08-27 MED ORDER — FLUOROURACIL 5 % EX CREA: TOPICAL_CREAM | CUTANEOUS | 0 refills | Status: DC

## 2020-08-27 NOTE — Patient Instructions (Signed)
Start 5-fluorouracil cream twice a day for 4 to 6 weeks as tolerated to affected area at right brow. Avoid getting in eye.

## 2020-08-27 NOTE — Progress Notes (Signed)
   Follow-Up Visit   Subjective  Mario Silva is a 55 y.o. male who presents for the following: Follow-up (Patient here today for 5 week bx proven wart follow up. He used HC 2.5% cream for 2 weeks.).  The following portions of the chart were reviewed this encounter and updated as appropriate:   Tobacco  Allergies  Meds  Problems  Med Hx  Surg Hx  Fam Hx      Review of Systems:  No other skin or systemic complaints except as noted in HPI or Assessment and Plan.  Objective  Well appearing patient in no apparent distress; mood and affect are within normal limits.  A focused examination was performed including face. Relevant physical exam findings are noted in the Assessment and Plan.  Objective  Right brow: Verrucous plaque with scale   Assessment & Plan  Other viral warts Right brow  Biopsy proven  Start 5-fluorouracil cream twice a day for 4 to 6 weeks as tolerated to affected area at right brow. Avoid getting in eye. Call if getting too irritated. Plan previously discussed over phone with sister, his healthcare POA   Ordered Medications: fluorouracil (EFUDEX) 5 % cream  Return in about 6 weeks (around 10/08/2020) for Wart.  Graciella Belton, RMA, am acting as scribe for Forest Gleason, MD . Documentation: I have reviewed the above documentation for accuracy and completeness, and I agree with the above.  Forest Gleason, MD

## 2020-09-22 ENCOUNTER — Telehealth: Payer: Self-pay | Admitting: Family Medicine

## 2020-09-22 DIAGNOSIS — G4733 Obstructive sleep apnea (adult) (pediatric): Secondary | ICD-10-CM | POA: Diagnosis not present

## 2020-09-22 DIAGNOSIS — F819 Developmental disorder of scholastic skills, unspecified: Secondary | ICD-10-CM

## 2020-09-22 NOTE — Telephone Encounter (Signed)
I spoke with the patient sister and she informed me that Social Security is requesting this letter from his PCP stating his medical diagnosis that is associated with specific incapabilities for example managing his on finances. She have documentation from [facility] Medicaid Disabled and Mental Retardation that she will bring by the office.

## 2020-09-22 NOTE — Telephone Encounter (Signed)
Pt sister norma Tamala Julian is calling and needs on our letter head her brother mental capacity she is trying to get legal guardianship . Pt has an appt with regina in mid April. Pt sister needs letter asap. Tthe hearing is next 10-12-2020. Pt mother has passed away

## 2020-09-22 NOTE — Telephone Encounter (Signed)
I am not as familiar with this patient, he followed with Cassell Smiles and then Cyndia Skeeters, FNP most recently.  I have seen him 2 times it looks like before for other issues.  I reviewed his chart. 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  Also - can you confirm more details of what the letter would need to include?  I would imagine it would just state that he is a patient at our office and he has the following diagnosis.  I am not sure if it would need any other specifics about what types of activities he can or cannot do for himself - I would need this list as well if they can provide this information if it needs to be included.  Thank you  Nobie Putnam, DO [facility] 09/22/2020, 12:03 PM

## 2020-09-22 NOTE — Telephone Encounter (Signed)
Alright. Thank you, I will wait for the paperwork to be dropped off before I write the letter, so I have more information.  Mario Silva, [facility] 09/22/2020, 2:11 PM

## 2020-09-23 ENCOUNTER — Encounter: Payer: Self-pay | Admitting: Family Medicine

## 2020-09-23 DIAGNOSIS — F819 Developmental disorder of scholastic skills, unspecified: Secondary | ICD-10-CM

## 2020-09-23 DIAGNOSIS — F809 Developmental disorder of speech and language, unspecified: Secondary | ICD-10-CM

## 2020-09-23 DIAGNOSIS — F70 Mild intellectual disabilities: Secondary | ICD-10-CM | POA: Insufficient documentation

## 2020-09-23 NOTE — Telephone Encounter (Signed)
Received a prior form from [facility] Intellectual Disability services, it does not have much detail on it regarding daily activities that he can or cannot do on his own.  I have written a general letter stating his medical diagnoses, and that he does not have capacity to make financial or healthcare decisions.  Letter is ready for pick up  Nobie Putnam, [facility] 09/23/2020, 1:03 PM

## 2020-09-23 NOTE — Telephone Encounter (Signed)
The pt sister [name] called and notified that the letter is ready for pick up.

## 2020-10-03 ENCOUNTER — Other Ambulatory Visit: Payer: Self-pay

## 2020-10-03 ENCOUNTER — Ambulatory Visit (INDEPENDENT_AMBULATORY_CARE_PROVIDER_SITE_OTHER): Payer: Medicare Other | Admitting: Physician Assistant

## 2020-10-03 ENCOUNTER — Encounter: Payer: Self-pay | Admitting: Physician Assistant

## 2020-10-03 VITALS — BP 124/82 | HR 67 | Temp 97.7°F | Resp 20 | Ht 74.0 in | Wt 325.2 lb

## 2020-10-03 DIAGNOSIS — Z114 Encounter for screening for human immunodeficiency virus [HIV]: Secondary | ICD-10-CM

## 2020-10-03 DIAGNOSIS — Z0000 Encounter for general adult medical examination without abnormal findings: Secondary | ICD-10-CM | POA: Diagnosis not present

## 2020-10-03 DIAGNOSIS — R7303 Prediabetes: Secondary | ICD-10-CM | POA: Diagnosis not present

## 2020-10-03 DIAGNOSIS — G4730 Sleep apnea, unspecified: Secondary | ICD-10-CM

## 2020-10-03 DIAGNOSIS — F419 Anxiety disorder, unspecified: Secondary | ICD-10-CM | POA: Diagnosis not present

## 2020-10-03 DIAGNOSIS — Z1159 Encounter for screening for other viral diseases: Secondary | ICD-10-CM

## 2020-10-03 DIAGNOSIS — F3341 Major depressive disorder, recurrent, in partial remission: Secondary | ICD-10-CM | POA: Diagnosis not present

## 2020-10-03 MED ORDER — ARIPIPRAZOLE 5 MG PO TABS: ORAL_TABLET | ORAL | 0 refills | Status: DC

## 2020-10-03 NOTE — Progress Notes (Addendum)
Complete physical exam   Patient: Mario Silva   DOB: 1965/06/29   55 y.o. Male  MRN: [id number] Visit Date: 10/03/2020  Today's healthcare provider: Trinna Post, PA-C   Chief Complaint  Patient presents with  . Annual Exam   Subjective    DONTAVIAN Silva is a 55 y.o. male who presents today for a complete physical exam.  He reports consuming a general diet. The patient does not participate in regular exercise at present. He generally feels well. He reports sleeping well. He does have additional problems to discuss today.  HPI   Patient presents today for CPE and follow up. [facility] lives [address]. Guardian was his mother but she recently passed away. Colon Flattery, his sister, is assuming his guardianship hopefully in May. He also has another brother. He presents today with caretaker Vicente Serene.   Depression, Follow-up  He  was last seen for this 6 months ago. Changes made at last visit include continue abilify 5 mg daily and ativant 0.5 mg daily PRN. Vicente Serene reports mood is overall stable and Dio uses the ativan maybe one time per month.    He reports excellent compliance with treatment. He is not having side effects.   He reports excellent tolerance of treatment. Current symptoms include: none He feels he is Unchanged since last visit.  Depression screen [facility] 2/9 10/03/2020 12/11/2019 09/12/2019  Decreased Interest 0 0 0  Down, Depressed, Hopeless 2 1 0  PHQ - 2 Score 2 1 0  Altered sleeping 0 - -  Tired, decreased energy 0 - -  Change in appetite 0 - -  Feeling bad or failure about yourself  0 - -  Trouble concentrating 3 - -  Moving slowly or fidgety/restless 0 - -  Suicidal thoughts 0 - -  PHQ-9 Score 5 - -  Difficult doing work/chores Not difficult at all - -    ----------------------------------------------------------------------------------------- The 10-year ASCVD risk score Mikey Bussing DC Jr., et al., 2013) is: 4.9%   Values used to calculate the  score:     Age: 60 years     Sex: Male     Is Non-Hispanic African American: No     Diabetic: No     Tobacco smoker: No     Systolic Blood Pressure: 976 mmHg     Is BP treated: No     HDL Cholesterol: 46 mg/dL     Total Cholesterol: 175 mg/dL   Past Medical History:  Diagnosis Date  . Acid reflux   . Actinic keratosis 08/30/2019   right eyebrow, bx proven  . Anxiety   . Depression   . Mental retardation   . Restless leg syndrome   . Sleep apnea    Past Surgical History:  Procedure Laterality Date  . COLONOSCOPY    . COLONOSCOPY WITH PROPOFOL N/A 02/08/2018   Procedure: COLONOSCOPY WITH PROPOFOL;  Surgeon: Robert Bellow, MD;  Location: [facility] ENDOSCOPY;  Service: General;  Laterality: N/A;  . diviated septum    . NOSE SURGERY     Social History   Socioeconomic History  . Marital status: Single    Spouse name: Not on file  . Number of children: Not on file  . Years of education: Not on file  . Highest education level: Not on file  Occupational History  . Not on file  Tobacco Use  . Smoking status: Never Smoker  . Smokeless tobacco: Never Used  Vaping Use  .  Vaping Use: Never used  Substance and Sexual Activity  . Alcohol use: No  . Drug use: No  . Sexual activity: Not on file  Other Topics Concern  . Not on file  Social History Narrative   ** Merged History Encounter **       Social Determinants of Health   Financial Resource Strain: Not on file  Food Insecurity: Not on file  Transportation Needs: Not on file  Physical Activity: Not on file  Stress: Not on file  Social Connections: Not on file  Intimate Partner Violence: Not on file   Family Status  Relation Name Status  . Mother  Alive  . Father  Deceased   Family History  Problem Relation Age of Onset  . Hypertension Mother   . Hyperlipidemia Mother   . Anxiety disorder Mother   . Thyroid disease Mother    Allergies  Allergen Reactions  . Penicillins   . Latex Other (See Comments) and  Rash    blister  . Latex Rash    Patient Care Team: Verl Bangs, FNP as PCP - General (Family Medicine) Lavera Guise, MD (Internal Medicine) Ronnell Freshwater, NP as Nurse Practitioner (Family Medicine) Robert Bellow, MD (General Surgery)   Medications: Outpatient Medications Prior to Visit  Medication Sig  . azelastine (ASTELIN) 0.1 % nasal spray Place into both nostrils.  Marland Kitchen b complex vitamins tablet Take 1 tablet by mouth daily.  . calcipotriene (DOVONOX) 0.005 % cream Apply topically 2 (two) times daily.  . Calcium Polycarbophil (FIBER-CAPS PO) Take by mouth.  . Cholecalciferol (VITAMIN D3 PO) Take by mouth.  Marland Kitchen LORazepam (ATIVAN) 0.5 MG tablet Take 1 tablet (0.5 mg total) by mouth daily as needed for anxiety.  . magnesium oxide (MAG-OX) 400 MG tablet Take 400 mg by mouth daily.  . meclizine (ANTIVERT) 25 MG tablet Take 1 tablet (25 mg total) by mouth 3 (three) times daily as needed for dizziness.  . pantoprazole (PROTONIX) 40 MG tablet Take 1 tablet (40 mg total) by mouth daily before breakfast.  . Probiotic Product (PROBIOTIC DAILY PO) Take by mouth.  . [DISCONTINUED] ARIPiprazole (ABILIFY) 5 MG tablet Take 1 tablet daily  . [DISCONTINUED] cetirizine (ZYRTEC) 10 MG tablet Take 1 tablet (10 mg total) by mouth daily. (Patient not taking: Reported on 10/03/2020)  . [DISCONTINUED] fluorouracil (EFUDEX) 5 % cream Apply topically in the morning and at bedtime. (Patient not taking: Reported on 10/03/2020)  . [DISCONTINUED] fluorouracil (EFUDEX) 5 % cream Apply to affected area at right brow twice daily for 4-6 weeks as tolerated. Avoid getting in eye. (Patient not taking: Reported on 10/03/2020)  . [DISCONTINUED] fluticasone (FLONASE) 50 MCG/ACT nasal spray Place 2 sprays into both nostrils daily. (Patient not taking: Reported on 10/03/2020)  . [DISCONTINUED] hydrocortisone 2.5 % cream Apply to rash on right eyebrow area twice a day for 2 weeks then stop (Patient not taking: Reported on  10/03/2020)   No facility-administered medications prior to visit.    Review of Systems  All other systems reviewed and are negative.     Objective    BP 124/82 (BP Location: Right Arm, Patient Position: Sitting, Cuff Size: Large)   Pulse 67   Temp 97.7 F (36.5 C) (Temporal)   Resp 20   Ht 6\' 2"  (1.88 m)   Wt (!) 325 lb 3.2 oz (147.5 kg)   SpO2 99%   BMI 41.75 kg/m    Physical Exam Constitutional:  Appearance: Normal appearance.  Cardiovascular:     Rate and Rhythm: Normal rate and regular rhythm.     Heart sounds: Normal heart sounds.  Pulmonary:     Effort: Pulmonary effort is normal.     Breath sounds: Normal breath sounds.  Skin:    General: Skin is warm and dry.  Neurological:     Mental Status: He is alert and oriented to person, place, and time. Mental status is at baseline.  Psychiatric:        Mood and Affect: Mood normal.        Behavior: Behavior normal.       Last depression screening scores PHQ 2/9 Scores 10/03/2020 12/11/2019 09/12/2019  PHQ - 2 Score 2 1 0  PHQ- 9 Score 5 - -   Last fall risk screening Fall Risk  12/11/2019  Falls in the past year? 0  Number falls in past yr: 0  Injury with Fall? 0   Last Audit-C alcohol use screening No flowsheet data found. A score of 3 or more in women, and 4 or more in men indicates increased risk for alcohol abuse, EXCEPT if all of the points are from question 1   No results found for any visits on 10/03/20.  Assessment & Plan    Routine Health Maintenance and Physical Exam  Exercise Activities and Dietary recommendations Goals   None     Immunization History  Administered Date(s) Administered  . Influenza,inj,Quad PF,6+ Mos 02/23/2019  . PFIZER(Purple Top)SARS-COV-2 Vaccination 08/27/2019, 09/17/2019, 04/24/2020  . Tdap 02/02/2018    Health Maintenance  Topic Date Due  . Hepatitis C Screening  Never done  . COVID-19 Vaccine (4 - Booster for Pfizer series) 10/23/2020  . INFLUENZA  VACCINE  01/26/2021  . TETANUS/TDAP  02/03/2028  . COLONOSCOPY (Pts 45-45yrs Insurance coverage will need to be confirmed)  02/09/2028  . HIV Screening  Completed  . HPV VACCINES  Aged Out    Discussed health benefits of physical activity, and encouraged him to engage in regular exercise appropriate for his age and condition.  1. Annual physical exam  - TSH - Lipid panel - Comprehensive metabolic panel - CBC with Differential/Platelet - HgB A1c - Direct LDL  2. Prediabetes  - HgB A1c  3. Anxiety  Continue abilify and ativan.   4. Recurrent major depressive disorder, in partial remission (HCC)  - ARIPiprazole (ABILIFY) 5 MG tablet; Take 1 tablet daily  Dispense: 90 tablet; Refill: 0  5. Sleep apnea, unspecified type   6. Encounter for screening for HIV  - HIV Antibody (routine testing w rflx)  7. Need for hepatitis C screening test  - Hepatitis C Antibody  8. Morbid obesity ([place])  Discussed importance of healthy weight management Discussed diet and exercise    Return if symptoms worsen or fail to improve.        Trinna Post, PA-C  [facility] [phone] (phone) [phone] (fax)  [place]

## 2020-10-06 LAB — COMPREHENSIVE METABOLIC PANEL
AG Ratio: 1.9 (calc) (ref 1.0–2.5)
ALT: 20 U/L (ref 9–46)
AST: 18 U/L (ref 10–35)
Albumin: 4.3 g/dL (ref 3.6–5.1)
Alkaline phosphatase (APISO): 57 U/L (ref 35–144)
BUN: 13 mg/dL (ref 7–25)
CO2: 29 mmol/L (ref 20–32)
Calcium: 9.6 mg/dL (ref 8.6–10.3)
Chloride: 107 mmol/L (ref 98–110)
Creat: 0.75 mg/dL (ref 0.70–1.33)
Globulin: 2.3 g/dL (calc) (ref 1.9–3.7)
Glucose, Bld: 89 mg/dL (ref 65–99)
Potassium: 4.5 mmol/L (ref 3.5–5.3)
Sodium: 142 mmol/L (ref 135–146)
Total Bilirubin: 0.5 mg/dL (ref 0.2–1.2)
Total Protein: 6.6 g/dL (ref 6.1–8.1)

## 2020-10-06 LAB — CBC WITH DIFFERENTIAL/PLATELET
Absolute Monocytes: 688 cells/uL (ref 200–950)
Basophils Absolute: 52 cells/uL (ref 0–200)
Basophils Relative: 0.7 %
Eosinophils Absolute: 141 cells/uL (ref 15–500)
Eosinophils Relative: 1.9 %
HCT: 48.8 % (ref 38.5–50.0)
Hemoglobin: 15.8 g/dL (ref 13.2–17.1)
Lymphs Abs: 2916 cells/uL (ref 850–3900)
MCH: 28.6 pg (ref 27.0–33.0)
MCHC: 32.4 g/dL (ref 32.0–36.0)
MCV: 88.4 fL (ref 80.0–100.0)
MPV: 10.8 fL (ref 7.5–12.5)
Monocytes Relative: 9.3 %
Neutro Abs: 3604 cells/uL (ref 1500–7800)
Neutrophils Relative %: 48.7 %
Platelets: 258 10*3/uL (ref 140–400)
RBC: 5.52 10*6/uL (ref 4.20–5.80)
RDW: 13.1 % (ref 11.0–15.0)
Total Lymphocyte: 39.4 %
WBC: 7.4 10*3/uL (ref 3.8–10.8)

## 2020-10-06 LAB — LIPID PANEL W/REFLEX DIRECT LDL
Cholesterol: 175 mg/dL (ref <200)
HDL: 46 mg/dL (ref >=40)
LDL Cholesterol (Calc): 97 mg/dL (calc)
Non-HDL Cholesterol (Calc): 129 mg/dL (calc) (ref <130)
Total CHOL/HDL Ratio: 3.8 (calc) (ref <5.0)
Triglycerides: 233 mg/dL — ABNORMAL HIGH (ref <150)

## 2020-10-06 LAB — HEPATITIS C ANTIBODY
Hepatitis C Ab: NONREACTIVE
SIGNAL TO CUT-OFF: 0.01 (ref <1.00)

## 2020-10-06 LAB — TSH: TSH: 1.98 mIU/L (ref 0.40–4.50)

## 2020-10-06 LAB — HIV ANTIBODY (ROUTINE TESTING W REFLEX): HIV 1&2 Ab, 4th Generation: NONREACTIVE

## 2020-10-06 LAB — HEMOGLOBIN A1C
Hgb A1c MFr Bld: 5.4 % of total Hgb (ref <5.7)
Mean Plasma Glucose: 108 mg/dL
eAG (mmol/L): 6 mmol/L

## 2020-10-15 ENCOUNTER — Other Ambulatory Visit: Payer: Self-pay

## 2020-10-15 ENCOUNTER — Encounter: Payer: Self-pay | Admitting: Dermatology

## 2020-10-15 ENCOUNTER — Ambulatory Visit (INDEPENDENT_AMBULATORY_CARE_PROVIDER_SITE_OTHER): Payer: Medicare Other | Admitting: Dermatology

## 2020-10-15 DIAGNOSIS — B079 Viral wart, unspecified: Secondary | ICD-10-CM

## 2020-10-15 NOTE — Patient Instructions (Signed)

## 2020-10-15 NOTE — Progress Notes (Signed)
   Follow-Up Visit   Subjective  Mario Silva is a 55 y.o. male who presents for the following: 3 month follow up (Patient here today for 3 month follow up on viral warts on right brow. Patient was prescribed Fluorouracil 5% cream to use 4 - 6 weeks. He states area is doing much better. He reports no new spots of concern. ).  The following portions of the chart were reviewed this encounter and updated as appropriate:  Tobacco  Allergies  Meds  Problems  Med Hx  Surg Hx  Fam Hx      Objective  Well appearing patient in no apparent distress; mood and affect are within normal limits.  A focused examination was performed including right eyebrow. Relevant physical exam findings are noted in the Assessment and Plan.  Objective  Right Eyebrow: Pink patch with slight peripheral scale  Assessment & Plan  Viral warts, unspecified type Right Eyebrow  Favor cleared  Discontinue fluorouracil 5 % cream and allow to heal  Will recheck in 3 months  Return in about 3 months (around 01/14/2021) for tbse and wart recheck on right brow.   I, Ruthell Rummage, CMA, am acting as scribe for Forest Gleason, MD.  Documentation: I have reviewed the above documentation for accuracy and completeness, and I agree with the above.  Forest Gleason, MD

## 2020-10-24 ENCOUNTER — Ambulatory Visit: Payer: Medicare Other | Admitting: Internal Medicine

## 2020-11-10 ENCOUNTER — Ambulatory Visit: Payer: Medicare Other | Admitting: Podiatry

## 2020-11-20 ENCOUNTER — Other Ambulatory Visit: Payer: Self-pay

## 2020-11-20 ENCOUNTER — Encounter: Payer: Self-pay | Admitting: Podiatry

## 2020-11-20 ENCOUNTER — Ambulatory Visit (INDEPENDENT_AMBULATORY_CARE_PROVIDER_SITE_OTHER): Payer: Medicare Other | Admitting: Podiatry

## 2020-11-20 DIAGNOSIS — B351 Tinea unguium: Secondary | ICD-10-CM

## 2020-11-20 DIAGNOSIS — M79675 Pain in left toe(s): Secondary | ICD-10-CM | POA: Diagnosis not present

## 2020-11-20 DIAGNOSIS — L84 Corns and callosities: Secondary | ICD-10-CM | POA: Insufficient documentation

## 2020-11-20 DIAGNOSIS — M79674 Pain in right toe(s): Secondary | ICD-10-CM | POA: Diagnosis not present

## 2020-11-20 DIAGNOSIS — I872 Venous insufficiency (chronic) (peripheral): Secondary | ICD-10-CM

## 2020-11-20 DIAGNOSIS — M2042 Other hammer toe(s) (acquired), left foot: Secondary | ICD-10-CM

## 2020-11-20 NOTE — Progress Notes (Signed)
This patient returns to my office for at risk foot care.  This patient requires this care by a professional since this patient will be at risk due to having venous stasis dermatitis  B/L. This patient is accompanied by caregiver.  This patient is unable to cut nails himself since the patient cannot reach his nails.These nails are painful walking and wearing shoes.  This patient presents for at risk foot care today.  General Appearance  Alert, conversant and in no acute stress.  Vascular  Dorsalis pedis and posterior tibial  pulses are palpable  bilaterally.  Capillary return is within normal limits  bilaterally. Temperature is within normal limits  bilaterally.  Neurologic  Senn-Weinstein monofilament wire test within normal limits  bilaterally. Muscle power within normal limits bilaterally.  Nails Thick disfigured discolored nails with subungual debris  from hallux to fifth toes bilaterally. No evidence of bacterial infection or drainage bilaterally.  Orthopedic  No limitations of motion  feet .  No crepitus or effusions noted.  Hallux malleus  B/L.  Hammer toes  B/L.  Skin  normotropic skin with no porokeratosis noted bilaterally.  No signs of infections or ulcers noted.   Pinch callus  B/L symptomatic.  Onychomycosis  Pain in right toes  Pain in left toes  Pinch callus  B/l.  Consent was obtained for treatment procedures.   Mechanical debridement of nails 1-5  bilaterally performed with a nail nipper.  Filed with dremel without incident. Debride callus with # 15 blade.   Return office visit   3 months                  Told patient to return for periodic foot care and evaluation due to potential at risk complications.   Gardiner Barefoot DPM

## 2020-12-16 ENCOUNTER — Ambulatory Visit: Payer: Medicare Other

## 2020-12-30 ENCOUNTER — Telehealth: Payer: Self-pay

## 2020-12-30 ENCOUNTER — Ambulatory Visit: Payer: Medicare Other

## 2020-12-30 NOTE — Telephone Encounter (Signed)
This nurse attempted to call patient in regards to scheduled telephonic AWV. Mario Silva of the group home at which patient lives stated that he was not home. We rescheduled for 01/13/2021 at 2:00.

## 2021-01-13 ENCOUNTER — Ambulatory Visit (INDEPENDENT_AMBULATORY_CARE_PROVIDER_SITE_OTHER): Payer: Medicare Other

## 2021-01-13 VITALS — Ht 76.0 in | Wt 290.0 lb

## 2021-01-13 DIAGNOSIS — Z0000 Encounter for general adult medical examination without abnormal findings: Secondary | ICD-10-CM

## 2021-01-13 NOTE — Progress Notes (Signed)
I connected with [name] today by telephone and verified that I am speaking with the correct person using two identifiers. Location patient: home Location provider: work Persons participating in the virtual visit: Ermalene Searing from group home, [employer] LPN.   I discussed the limitations, risks, security and privacy concerns of performing an evaluation and management service by telephone and the availability of in person appointments. I also discussed with the patient that there may be a patient responsible charge related to this service. The patient expressed understanding and verbally consented to this telephonic visit.    Interactive audio and video telecommunications were attempted between this provider and patient, however failed, due to patient having technical difficulties OR patient did not have access to video capability.  We continued and completed visit with audio only.     Vital signs may be patient reported or missing.  Subjective:   Mario Silva is a 55 y.o. male who presents for Medicare Annual/Subsequent preventive examination.  Review of Systems     Cardiac Risk Factors include: advanced age (>87men, >30 women);obesity (BMI >30kg/m2)     Objective:    Today's Vitals   01/13/21 1517  Weight: 290 lb (131.5 kg)  Height: 6\' 4"  (1.93 m)   Body mass index is 35.3 kg/m.  Advanced Directives 01/13/2021 12/11/2019 06/25/2019 02/08/2018 12/07/2014  Does Patient Have a Medical Advance Directive? Yes No No Yes No  Type of Advance Directive [place];Living will -  Copy of [place] in Chart? No - copy requested - - No - copy requested -  Would patient like information on creating a medical advance directive? - - - - No - patient declined information    Current Medications (verified) Outpatient Encounter Medications as of 01/13/2021  Medication Sig   ARIPiprazole (ABILIFY) 5 MG tablet Take 1  tablet daily   azelastine (ASTELIN) 0.1 % nasal spray Place into both nostrils.   b complex vitamins tablet Take 1 tablet by mouth daily.   Calcium Polycarbophil (FIBER-CAPS PO) Take by mouth.   Cholecalciferol (VITAMIN D3 PO) Take by mouth.   LORazepam (ATIVAN) 0.5 MG tablet Take 1 tablet (0.5 mg total) by mouth daily as needed for anxiety.   magnesium oxide (MAG-OX) 400 MG tablet Take 400 mg by mouth daily.   meclizine (ANTIVERT) 25 MG tablet Take 1 tablet (25 mg total) by mouth 3 (three) times daily as needed for dizziness.   pantoprazole (PROTONIX) 40 MG tablet Take 1 tablet (40 mg total) by mouth daily before breakfast.   Probiotic Product (PROBIOTIC DAILY PO) Take by mouth.   calcipotriene (DOVONOX) 0.005 % cream Apply topically 2 (two) times daily. (Patient not taking: Reported on 01/13/2021)   No facility-administered encounter medications on file as of 01/13/2021.    Allergies (verified) Penicillins, Latex, and Latex   History: Past Medical History:  Diagnosis Date   Acid reflux    Actinic keratosis 08/30/2019   right eyebrow, bx proven   Anxiety    Depression    Mental retardation    Restless leg syndrome    Sleep apnea    Past Surgical History:  Procedure Laterality Date   COLONOSCOPY     COLONOSCOPY WITH PROPOFOL N/A 02/08/2018   Procedure: COLONOSCOPY WITH PROPOFOL;  Surgeon: Robert Bellow, MD;  Location: [facility] ENDOSCOPY;  Service: General;  Laterality: N/A;   diviated septum     NOSE SURGERY  Family History  Problem Relation Age of Onset   Hypertension Mother    Hyperlipidemia Mother    Anxiety disorder Mother    Thyroid disease Mother    Social History   Socioeconomic History   Marital status: Single    Spouse name: Not on file   Number of children: Not on file   Years of education: Not on file   Highest education level: Not on file  Occupational History   Not on file  Tobacco Use   Smoking status: Never   Smokeless tobacco: Never  Vaping  Use   Vaping Use: Never used  Substance and Sexual Activity   Alcohol use: No   Drug use: No   Sexual activity: Not on file  Other Topics Concern   Not on file  Social History Narrative   ** Merged History Encounter **       Social Determinants of Health   Financial Resource Strain: Low Risk    Difficulty of Paying Living Expenses: Not hard at all  Food Insecurity: No Food Insecurity   Worried About Charity fundraiser in the Last Year: Never true   [place] in the Last Year: Never true  Transportation Needs: No Transportation Needs   Lack of Transportation (Medical): No   Lack of Transportation (Non-Medical): No  Physical Activity: Sufficiently Active   Days of Exercise per Week: 5 days   Minutes of Exercise per Session: 30 min  Stress: No Stress Concern Present   Feeling of Stress : Not at all  Social Connections: Not on file    Tobacco Counseling Counseling given: Not Answered   Clinical Intake:  Pre-visit preparation completed: Yes  Pain : No/denies pain     Nutritional Status: BMI > 30  Obese Nutritional Risks: Nausea/ vomitting/ diarrhea (loose stool this weekend, resolved) Diabetes: No  What is the last grade level you completed in school?: 12th grade  Diabetic? no  Interpreter Needed?: No  Information entered by :: NAllen LPN   Activities of Daily Living In your present state of health, do you have any difficulty performing the following activities: 01/13/2021  Hearing? N  Vision? N  Difficulty concentrating or making decisions? N  Walking or climbing stairs? N  Dressing or bathing? N  Doing errands, shopping? Y  Comment does not Physiological scientist and eating ? N  Using the Toilet? N  In the past six months, have you accidently leaked urine? Y  Do you have problems with loss of bowel control? Y  Managing your Medications? Y  Managing your Finances? Y  Housekeeping or managing your Housekeeping? N  Some recent data might be hidden     Patient Care Team: Malfi, Lupita Raider, FNP as PCP - General (Family Medicine) Lavera Guise, MD (Internal Medicine) Ronnell Freshwater, NP as Nurse Practitioner (Family Medicine) Bary Castilla Forest Gleason, MD (General Surgery)  Indicate any recent Medical Services you may have received from other than [facility] providers in the past year (date may be approximate).     Assessment:   This is a routine wellness examination for [facility].  Hearing/Vision screen Vision Screening - Comments:: Regular eye exams, [facility]  Dietary issues and exercise activities discussed: Current Exercise Habits: Home exercise routine, Type of exercise: walking, Time (Minutes): 30, Frequency (Times/Week): 5, Weekly Exercise (Minutes/Week): 150   Goals Addressed             This Visit's Progress    Patient  Stated       01/13/2021, wants to weigh 260 pounds       Depression Screen PHQ 2/9 Scores 01/13/2021 10/03/2020 12/11/2019 09/12/2019 07/03/2019 02/23/2019 09/22/2017  PHQ - 2 Score 0 2 1 0 1 1 2   PHQ- 9 Score - 5 - - 4 6 -    Fall Risk Fall Risk  01/13/2021 12/11/2019 02/23/2019 09/22/2017  Falls in the past year? 1 0 0 No  Comment slipped after swimming - - -  Number falls in past yr: 0 0 0 -  Injury with Fall? 0 0 - -  Risk for fall due to : Medication side effect - - -  Follow up Falls evaluation completed;Education provided;Falls prevention discussed - - -    FALL RISK PREVENTION PERTAINING TO THE HOME:  Any stairs in or around the home? Yes  If so, are there any without handrails? No  Home free of loose throw rugs in walkways, pet beds, electrical cords, etc? Yes  Adequate lighting in your home to reduce risk of falls? Yes   ASSISTIVE DEVICES UTILIZED TO PREVENT FALLS:  Life alert? No  Use of a cane, walker or w/c? No  Grab bars in the bathroom? No  Shower chair or bench in shower? No  Elevated toilet seat or a handicapped toilet? No   TIMED UP AND GO:  Was the test performed? No .       Cognitive Function:        Immunizations Immunization History  Administered Date(s) Administered   Influenza,inj,Quad PF,6+ Mos 02/23/2019   PFIZER(Purple Top)SARS-COV-2 Vaccination 08/27/2019, 09/17/2019, 04/24/2020   Tdap 02/02/2018    TDAP status: Up to date  Flu Vaccine status: Up to date  Pneumococcal vaccine status: Due, Education has been provided regarding the importance of this vaccine. Advised may receive this vaccine at local pharmacy or Health Dept. Aware to provide a copy of the vaccination record if obtained from local pharmacy or Health Dept. Verbalized acceptance and understanding.  Covid-19 vaccine status: Completed vaccines  Qualifies for Shingles Vaccine? Yes   Zostavax completed No   Shingrix Completed?: No.    Education has been provided regarding the importance of this vaccine. Patient has been advised to call insurance company to determine out of pocket expense if they have not yet received this vaccine. Advised may also receive vaccine at local pharmacy or Health Dept. Verbalized acceptance and understanding.  Screening Tests Health Maintenance  Topic Date Due   Pneumococcal Vaccine 56-84 Years old (1 - PCV) Never done   Zoster Vaccines- Shingrix (1 of 2) Never done   COVID-19 Vaccine (4 - Booster for Pfizer series) 07/25/2020   INFLUENZA VACCINE  01/26/2021   TETANUS/TDAP  02/03/2028   COLONOSCOPY (Pts 45-64yrs Insurance coverage will need to be confirmed)  02/09/2028   Hepatitis C Screening  Completed   HIV Screening  Completed   HPV VACCINES  Aged Out    Health Maintenance  Health Maintenance Due  Topic Date Due   Pneumococcal Vaccine 31-55 Years old (1 - PCV) Never done   Zoster Vaccines- Shingrix (1 of 2) Never done   COVID-19 Vaccine (4 - Booster for Pfizer series) 07/25/2020    Colorectal cancer screening: Type of screening: Colonoscopy. Completed 02/08/2018. Repeat every 10 years  Lung Cancer Screening: (Low Dose CT Chest  recommended if Age 45-80 years, 30 pack-year currently smoking OR have quit w/in 15years.) does not qualify.   Lung Cancer Screening Referral: no  Additional Screening:  Hepatitis C Screening: does qualify; Completed 10/03/2020  Vision Screening: Recommended annual ophthalmology exams for early detection of glaucoma and other disorders of the eye. Is the patient up to date with their annual eye exam?  Yes  Who is the provider or what is the name of the office in which the patient attends annual eye exams? [facility] If pt is not established with a provider, would they like to be referred to a provider to establish care? No .   Dental Screening: Recommended annual dental exams for proper oral hygiene  Community Resource Referral / Chronic Care Management: CRR required this visit?  No   CCM required this visit?  No      Plan:     I have personally reviewed and noted the following in the patient's chart:   Medical and social history Use of alcohol, tobacco or illicit drugs  Current medications and supplements including opioid prescriptions. Patient is not currently taking opioid prescriptions. Functional ability and status Nutritional status Physical activity Advanced directives List of other physicians Hospitalizations, surgeries, and ER visits in previous 12 months Vitals Screenings to include cognitive, depression, and falls Referrals and appointments  In addition, I have reviewed and discussed with patient certain preventive protocols, quality metrics, and best practice recommendations. A written personalized care plan for preventive services as well as general preventive health recommendations were provided to patient.     Kellie Simmering, LPN   0/28/9022   Nurse Notes: 6 CIT not administered. Patient has cognitive delay.

## 2021-01-13 NOTE — Patient Instructions (Signed)
[name] , Thank you for taking time to come for your Medicare Wellness Visit. I appreciate your ongoing commitment to your health goals. Please review the following plan we discussed and let me know if I can assist you in the future.   Screening recommendations/referrals: Colonoscopy: completed 02/08/2018 Recommended yearly ophthalmology/optometry visit for glaucoma screening and checkup Recommended yearly dental visit for hygiene and checkup  Vaccinations: Influenza vaccine: due 01/26/2021 Pneumococcal vaccine: due Tdap vaccine: completed 02/02/2018, due 02/03/2028 Shingles vaccine: discussed   Covid-19: 04/24/2020, 09/17/2019, 08/27/2019  Advanced directives: Please bring a copy of your POA (Power of Attorney) and/or Living Will to your next appointment.   Conditions/risks identified: none  Next appointment: Follow up in one year for your annual wellness visit   Preventive Care 40-64 Years, Male Preventive care refers to lifestyle choices and visits with your health care provider that can promote health and wellness. What does preventive care include? A yearly physical exam. This is also called an annual well check. Dental exams once or twice a year. Routine eye exams. Ask your health care provider how often you should have your eyes checked. Personal lifestyle choices, including: Daily care of your teeth and gums. Regular physical activity. Eating a healthy diet. Avoiding tobacco and drug use. Limiting alcohol use. Practicing safe sex. Taking low-dose aspirin every day starting at age 1. What happens during an annual well check? The services and screenings done by your health care provider during your annual well check will depend on your age, overall health, lifestyle risk factors, and family history of disease. Counseling  Your health care provider may ask you questions about your: Alcohol use. Tobacco use. Drug use. Emotional well-being. Home and relationship  well-being. Sexual activity. Eating habits. Work and work Statistician. Screening  You may have the following tests or measurements: Height, weight, and BMI. Blood pressure. Lipid and cholesterol levels. These may be checked every 5 years, or more frequently if you are over 58 years old. Skin check. Lung cancer screening. You may have this screening every year starting at age 8 if you have a 30-pack-year history of smoking and currently smoke or have quit within the past 15 years. Fecal occult blood test (FOBT) of the stool. You may have this test every year starting at age 19. Flexible sigmoidoscopy or colonoscopy. You may have a sigmoidoscopy every 5 years or a colonoscopy every 10 years starting at age 77. Prostate cancer screening. Recommendations will vary depending on your family history and other risks. Hepatitis C blood test. Hepatitis B blood test. Sexually transmitted disease (STD) testing. Diabetes screening. This is done by checking your blood sugar (glucose) after you have not eaten for a while (fasting). You may have this done every 1-3 years. Discuss your test results, treatment options, and if necessary, the need for more tests with your health care provider. Vaccines  Your health care provider may recommend certain vaccines, such as: Influenza vaccine. This is recommended every year. Tetanus, diphtheria, and acellular pertussis (Tdap, Td) vaccine. You may need a Td booster every 10 years. Zoster vaccine. You may need this after age 43. Pneumococcal 13-valent conjugate (PCV13) vaccine. You may need this if you have certain conditions and have not been vaccinated. Pneumococcal polysaccharide (PPSV23) vaccine. You may need one or two doses if you smoke cigarettes or if you have certain conditions. Talk to your health care provider about which screenings and vaccines you need and how often you need them. This information is not intended to  replace advice given to you by your  health care provider. Make sure you discuss any questions you have with your health care provider. Document Released: 07/11/2015 Document Revised: 03/03/2016 Document Reviewed: 04/15/2015 Elsevier Interactive Patient Education  2017 [place] Prevention in the Home Falls can cause injuries. They can happen to people of all ages. There are many things you can do to make your home safe and to help prevent falls. What can I do on the outside of my home? Regularly fix the edges of walkways and driveways and fix any cracks. Remove anything that might make you trip as you walk through a door, such as a raised step or threshold. Trim any bushes or trees on the path to your home. Use bright outdoor lighting. Clear any walking paths of anything that might make someone trip, such as rocks or tools. Regularly check to see if handrails are loose or broken. Make sure that both sides of any steps have handrails. Any raised decks and porches should have guardrails on the edges. Have any leaves, snow, or ice cleared regularly. Use sand or salt on walking paths during winter. Clean up any spills in your garage right away. This includes oil or grease spills. What can I do in the bathroom? Use night lights. Install grab bars by the toilet and in the tub and shower. Do not use towel bars as grab bars. Use non-skid mats or decals in the tub or shower. If you need to sit down in the shower, use a plastic, non-slip stool. Keep the floor dry. Clean up any water that spills on the floor as soon as it happens. Remove soap buildup in the tub or shower regularly. Attach bath mats securely with double-sided non-slip rug tape. Do not have throw rugs and other things on the floor that can make you trip. What can I do in the bedroom? Use night lights. Make sure that you have a light by your bed that is easy to reach. Do not use any sheets or blankets that are too big for your bed. They should not hang down  onto the floor. Have a firm chair that has side arms. You can use this for support while you get dressed. Do not have throw rugs and other things on the floor that can make you trip. What can I do in the kitchen? Clean up any spills right away. Avoid walking on wet floors. Keep items that you use a lot in easy-to-reach places. If you need to reach something above you, use a strong step stool that has a grab bar. Keep electrical cords out of the way. Do not use floor polish or wax that makes floors slippery. If you must use wax, use non-skid floor wax. Do not have throw rugs and other things on the floor that can make you trip. What can I do with my stairs? Do not leave any items on the stairs. Make sure that there are handrails on both sides of the stairs and use them. Fix handrails that are broken or loose. Make sure that handrails are as long as the stairways. Check any carpeting to make sure that it is firmly attached to the stairs. Fix any carpet that is loose or worn. Avoid having throw rugs at the top or bottom of the stairs. If you do have throw rugs, attach them to the floor with carpet tape. Make sure that you have a light switch at the top of the stairs and the  bottom of the stairs. If you do not have them, ask someone to add them for you. What else can I do to help prevent falls? Wear shoes that: Do not have high heels. Have rubber bottoms. Are comfortable and fit you well. Are closed at the toe. Do not wear sandals. If you use a stepladder: Make sure that it is fully opened. Do not climb a closed stepladder. Make sure that both sides of the stepladder are locked into place. Ask someone to hold it for you, if possible. Clearly mark and make sure that you can see: Any grab bars or handrails. First and last steps. Where the edge of each step is. Use tools that help you move around (mobility aids) if they are needed. These include: Canes. Walkers. Scooters. Crutches. Turn  on the lights when you go into a dark area. Replace any light bulbs as soon as they burn out. Set up your furniture so you have a clear path. Avoid moving your furniture around. If any of your floors are uneven, fix them. If there are any pets around you, be aware of where they are. Review your medicines with your doctor. Some medicines can make you feel dizzy. This can increase your chance of falling. Ask your doctor what other things that you can do to help prevent falls. This information is not intended to replace advice given to you by your health care provider. Make sure you discuss any questions you have with your health care provider. Document Released: 04/10/2009 Document Revised: 11/20/2015 Document Reviewed: 07/19/2014 Elsevier Interactive Patient Education  2017 [employer].

## 2021-01-21 ENCOUNTER — Other Ambulatory Visit: Payer: Self-pay

## 2021-01-21 ENCOUNTER — Ambulatory Visit (INDEPENDENT_AMBULATORY_CARE_PROVIDER_SITE_OTHER): Payer: Medicare Other | Admitting: Dermatology

## 2021-01-21 ENCOUNTER — Encounter: Payer: Self-pay | Admitting: Dermatology

## 2021-01-21 DIAGNOSIS — Z1283 Encounter for screening for malignant neoplasm of skin: Secondary | ICD-10-CM | POA: Diagnosis not present

## 2021-01-21 DIAGNOSIS — D2372 Other benign neoplasm of skin of left lower limb, including hip: Secondary | ICD-10-CM

## 2021-01-21 DIAGNOSIS — D229 Melanocytic nevi, unspecified: Secondary | ICD-10-CM

## 2021-01-21 DIAGNOSIS — L821 Other seborrheic keratosis: Secondary | ICD-10-CM | POA: Diagnosis not present

## 2021-01-21 DIAGNOSIS — L578 Other skin changes due to chronic exposure to nonionizing radiation: Secondary | ICD-10-CM

## 2021-01-21 DIAGNOSIS — B079 Viral wart, unspecified: Secondary | ICD-10-CM | POA: Diagnosis not present

## 2021-01-21 DIAGNOSIS — D1800 Hemangioma unspecified site: Secondary | ICD-10-CM | POA: Diagnosis not present

## 2021-01-21 DIAGNOSIS — D485 Neoplasm of uncertain behavior of skin: Secondary | ICD-10-CM

## 2021-01-21 DIAGNOSIS — L814 Other melanin hyperpigmentation: Secondary | ICD-10-CM

## 2021-01-21 DIAGNOSIS — L921 Necrobiosis lipoidica, not elsewhere classified: Secondary | ICD-10-CM

## 2021-01-21 DIAGNOSIS — B353 Tinea pedis: Secondary | ICD-10-CM | POA: Diagnosis not present

## 2021-01-21 MED ORDER — CICLOPIROX OLAMINE 0.77 % EX CREA: TOPICAL_CREAM | CUTANEOUS | 1 refills | Status: DC

## 2021-01-21 NOTE — Patient Instructions (Addendum)
If you have any questions or concerns for your doctor, please call our main line at [phone] and press option 4 to reach your doctor's medical assistant. If no one answers, please leave a voicemail as directed and we will return your call as soon as possible. Messages left after 4 pm will be answered the following business day.   You may also send [place] a message via [place]. We typically respond to MyChart messages within 1-2 business days.  For prescription refills, please ask your pharmacy to contact our office. Our fax number is [phone].  If you have an urgent issue when the clinic is closed that cannot wait until the next business day, you can page your doctor at the number below.    Please note that while we do our best to be available for urgent issues outside of office hours, we are not available 24/7.   If you have an urgent issue and are unable to reach [place], you may choose to seek medical care at your doctor's office, retail clinic, urgent care center, or emergency room.  If you have a medical emergency, please immediately call 911 or go to the emergency department.  Pager Numbers  - Dr. Nehemiah Massed: [phone]  - Dr. Laurence Ferrari: [phone]  - Dr. Nicole Kindred: [phone]  In the event of inclement weather, please call our main line at [phone] for an update on the status of any delays or closures.  Dermatology Medication Tips: Please keep the boxes that topical medications come in in order to help keep track of the instructions about where and how to use these. Pharmacies typically print the medication instructions only on the boxes and not directly on the medication tubes.   If your medication is too expensive, please contact our office at [phone] option 4 or send [place] a message through [place].   We are unable to tell what your co-pay for medications will be in advance as this is different depending on your insurance coverage. However, we may be able to find a substitute  medication at lower cost or fill out paperwork to get insurance to cover a needed medication.   If a prior authorization is required to get your medication covered by your insurance company, please allow [place] 1-2 business days to complete this process.  Drug prices often vary depending on where the prescription is filled and some pharmacies may offer cheaper prices.  The website www.goodrx.com contains coupons for medications through different pharmacies. The prices here do not account for what the cost may be with help from insurance (it may be cheaper with your insurance), but the website can give you the price if you did not use any insurance.  - You can print the associated coupon and take it with your prescription to the pharmacy.  - You may also stop by our office during regular business hours and pick up a GoodRx coupon card.  - If you need your prescription sent electronically to a different pharmacy, notify our office through [facility] or by phone at [phone] option 4.   Pre-Operative Instructions  You are scheduled for a surgical procedure at [facility]. We recommend you read the following instructions. If you have any questions or concerns, please call the office at [phone].  Shower and wash the entire body with soap and water the day of your surgery paying special attention to cleansing at and around the planned surgery site.  Avoid aspirin or aspirin containing products at least fourteen (14) days  prior to your surgical procedure and for at least one week (7 Days) after your surgical procedure. If you take aspirin on a regular basis for heart disease or history of stroke or for any other reason, we may recommend you continue taking aspirin but please notify us if you take this on a regular basis. Aspirin can cause more bleeding to occur during surgery as well as prolonged bleeding and bruising after surgery.   Avoid other nonsteroidal pain medications at  least one week prior to surgery and at least one week prior to your surgery. These include medications such as Ibuprofen (Motrin, Advil and Nuprin), Naprosyn, Voltaren, Relafen, etc. If medications are used for therapeutic reasons, please inform us as they can cause increased bleeding or prolonged bleeding during and bruising after surgical procedures.   Please advise [place] if you are taking any "blood thinner" medications such as Coumadin or Dipyridamole or Plavix or similar medications. These cause increased bleeding and prolonged bleeding during procedures and bruising after surgical procedures. We may have to consider discontinuing these medications briefly prior to and shortly after your surgery if safe to do so.   Please inform us of all medications you are currently taking. All medications that are taken regularly should be taken the day of surgery as you always do. Nevertheless, we need to be informed of what medications you are taking prior to surgery to know whether they will affect the procedure or cause any complications.   Please inform us of any medication allergies. Also inform us of whether you have allergies to Latex or rubber products or whether you have had any adverse reaction to Lidocaine or Epinephrine.  Please inform us of any prosthetic or artificial body parts such as artificial heart valve, joint replacements, etc., or similar condition that might require preoperative antibiotics.   We recommend avoidance of alcohol at least two weeks prior to surgery and continued avoidance for at least two weeks after surgery.   We recommend discontinuation of tobacco smoking at least two weeks prior to surgery and continued abstinence for at least two weeks after surgery.  Do not plan strenuous exercise, strenuous work or strenuous lifting for approximately four weeks after your surgery.   We request if you are unable to make your scheduled surgical appointment, please call us at least a  week in advance or as soon as you are aware of a problem so that we can cancel or reschedule the appointment.   You MAY TAKE TYLENOL (acetaminophen) for pain as it is not a blood thinner.   PLEASE PLAN TO BE IN TOWN FOR TWO WEEKS FOLLOWING SURGERY, THIS IS IMPORTANT SO YOU CAN BE CHECKED FOR DRESSING CHANGES, SUTURE REMOVAL AND TO MONITOR FOR POSSIBLE COMPLICATIONS.   Start Ciclopirox cream twice per day on the feet until scale has resolved. Once you notice no more scale continue twice per day for one more week. Then use once per week thereafter to prevent recurrence.

## 2021-01-21 NOTE — Progress Notes (Signed)
Follow-Up Visit   Subjective  Mario Silva is a 55 y.o. male who presents for the following: Annual Exam (Hx AK - bx proven R eyebrow ) and hx of wart  (Resolved per patient - R brow ).    The following portions of the chart were reviewed this encounter and updated as appropriate:   Tobacco  Allergies  Meds  Problems  Med Hx  Surg Hx  Fam Hx     Review of Systems:  No other skin or systemic complaints except as noted in HPI or Assessment and Plan.  Objective  Well appearing patient in no apparent distress; mood and affect are within normal limits.  A focused examination was performed including the face. Relevant physical exam findings are noted in the Assessment and Plan.  R brow x 1 Verrucous papule -- Discussed viral etiology and contagion.   Left Antecubital Fossa Very firm approximately 2.4 cm SQ nodule.   B/L leg Hyperpigmented atrophic patches  B/L foot Scaling and maceration web spaces and over distal and lateral soles.    Assessment & Plan  Viral warts, unspecified type R brow x 1  Vs AK - Previous biopsy has been c/w wart. Favor wart today.    Discussed viral etiology and risk of spread.  Discussed multiple treatments may be required to clear warts.  Discussed possible post-treatment dyspigmentation and risk of recurrence.  Destruction of lesion - R brow x 1 Complexity: simple   Destruction method: cryotherapy   Informed consent: discussed and consent obtained   Timeout:  patient name, date of birth, surgical site, and procedure verified Lesion destroyed using liquid nitrogen: Yes   Region frozen until ice ball extended beyond lesion: Yes   Outcome: patient tolerated procedure well with no complications   Post-procedure details: wound care instructions given    Neoplasm of uncertain behavior of skin Left Antecubital Fossa  Bothersome to patient. Discussed excision here in the office. Will call POA to confirm prior to surgery.  Necrobiosis  lipoidica B/L leg  Stable. Non-bothersome to patient. Defer treatment.  Tinea pedis of both feet B/L foot  Start Ciclopirox BID until no longer scaly. Then continue one more week BID. Afterwards use QW to prevent recurrence.   ciclopirox (LOPROX) 0.77 % cream - B/L foot For fungal infection - apply to feet BID until scale has resolved. Then, continue BID for one more week after that. Afterwards, continue QW to prevent recurrence.  Lentigines - Scattered tan macules - Due to sun exposure - Benign-appering, observe - Recommend daily broad spectrum sunscreen SPF 30+ to sun-exposed areas, reapply every 2 hours as needed. - Call for any changes  Seborrheic Keratoses - Stuck-on, waxy, tan-brown papules and/or plaques  - Benign-appearing - Discussed benign etiology and prognosis. - Observe - Call for any changes  Melanocytic Nevi - Tan-brown and/or pink-flesh-colored symmetric macules and papules - Benign appearing on exam today - Observation - Call clinic for new or changing moles - Recommend daily use of broad spectrum spf 30+ sunscreen to sun-exposed areas.   Hemangiomas - Red papules - Discussed benign nature - Observe - Call for any changes  Actinic Damage - Chronic condition, secondary to cumulative UV/sun exposure - diffuse scaly erythematous macules with underlying dyspigmentation - Recommend daily broad spectrum sunscreen SPF 30+ to sun-exposed areas, reapply every 2 hours as needed.  - Staying in the shade or wearing long sleeves, sun glasses (UVA+UVB protection) and wide brim hats (4-inch brim around the entire circumference  of the hat) are also recommended for sun protection.  - Call for new or changing lesions.  Dermatofibroma - L thigh  - Firm pink/brown papulenodule with dimple sign - Benign appearing - Call for any changes  Skin cancer screening performed today.  Return for surgery - NOB of the L antecubital fossa, 1 year TBSE .  Luther Redo, CMA,  am acting as scribe for Forest Gleason, MD .  Documentation: I have reviewed the above documentation for accuracy and completeness, and I agree with the above.  Forest Gleason, MD

## 2021-01-23 DIAGNOSIS — G4733 Obstructive sleep apnea (adult) (pediatric): Secondary | ICD-10-CM | POA: Diagnosis not present

## 2021-02-18 ENCOUNTER — Encounter: Payer: Medicare Other | Admitting: Dermatology

## 2021-02-19 ENCOUNTER — Encounter: Payer: Self-pay | Admitting: Podiatry

## 2021-02-19 ENCOUNTER — Ambulatory Visit: Payer: Medicare Other | Admitting: Podiatry

## 2021-02-19 ENCOUNTER — Other Ambulatory Visit: Payer: Self-pay

## 2021-02-19 ENCOUNTER — Ambulatory Visit (INDEPENDENT_AMBULATORY_CARE_PROVIDER_SITE_OTHER): Payer: Medicare Other | Admitting: Podiatry

## 2021-02-19 DIAGNOSIS — B351 Tinea unguium: Secondary | ICD-10-CM | POA: Diagnosis not present

## 2021-02-19 DIAGNOSIS — M79675 Pain in left toe(s): Secondary | ICD-10-CM

## 2021-02-19 DIAGNOSIS — M79674 Pain in right toe(s): Secondary | ICD-10-CM | POA: Diagnosis not present

## 2021-02-19 NOTE — Progress Notes (Signed)
This patient returns to my office for at risk foot care.  This patient requires this care by a professional since this patient will be at risk due to having venous stasis dermatitis  B/L. This patient is accompanied by caregiver.  This patient is unable to cut nails himself since the patient cannot reach his nails.These nails are painful walking and wearing shoes.  This patient presents for at risk foot care today.  General Appearance  Alert, conversant and in no acute stress.  Vascular  Dorsalis pedis and posterior tibial  pulses are palpable  bilaterally.  Capillary return is within normal limits  bilaterally. Temperature is within normal limits  bilaterally.  Neurologic  Senn-Weinstein monofilament wire test within normal limits  bilaterally. Muscle power within normal limits bilaterally.  Nails Thick disfigured discolored nails with subungual debris  from hallux to fifth toes bilaterally. No evidence of bacterial infection or drainage bilaterally.  Orthopedic  No limitations of motion  feet .  No crepitus or effusions noted.  Hallux malleus  B/L.  Hammer toes  B/L.  Skin  normotropic skin with no porokeratosis noted bilaterally.  No signs of infections or ulcers noted.   Pinch callus  B/L symptomatic.  Onychomycosis  Pain in right toes  Pain in left toes  Pinch callus  B/l.  Consent was obtained for treatment procedures.   Mechanical debridement of nails 1-5  bilaterally performed with a nail nipper.  Filed with dremel without incident. Debride callus with # 15 blade.   Return office visit   3 months                  Told patient to return for periodic foot care and evaluation due to potential at risk complications.   Boneta Lucks, DPM

## 2021-03-18 ENCOUNTER — Encounter: Payer: Medicare Other | Admitting: Dermatology

## 2021-03-30 DIAGNOSIS — H2513 Age-related nuclear cataract, bilateral: Secondary | ICD-10-CM | POA: Diagnosis not present

## 2021-04-08 ENCOUNTER — Other Ambulatory Visit: Payer: Self-pay

## 2021-04-08 ENCOUNTER — Ambulatory Visit (INDEPENDENT_AMBULATORY_CARE_PROVIDER_SITE_OTHER): Payer: Medicare Other | Admitting: Dermatology

## 2021-04-08 DIAGNOSIS — D2361 Other benign neoplasm of skin of right upper limb, including shoulder: Secondary | ICD-10-CM

## 2021-04-08 DIAGNOSIS — D485 Neoplasm of uncertain behavior of skin: Secondary | ICD-10-CM

## 2021-04-08 MED ORDER — MUPIROCIN 2 % EX OINT: 1.0000 "application " | TOPICAL_OINTMENT | Freq: Every day | CUTANEOUS | 0 refills | Status: DC

## 2021-04-08 NOTE — Patient Instructions (Signed)
Wound Care Instructions  Cleanse wound gently with soap and water once a day then pat dry with clean gauze. Apply a thing coat of Petrolatum (petroleum jelly, "Vaseline") over the wound (unless you have an allergy to this). We recommend that you use a new, sterile tube of Vaseline. Do not pick or remove scabs. Do not remove the yellow or white "healing tissue" from the base of the wound.  Cover the wound with fresh, clean, nonstick gauze and secure with paper tape. You may use Band-Aids in place of gauze and tape if the would is small enough, but would recommend trimming much of the tape off as there is often too much. Sometimes Band-Aids can irritate the skin.  You should call the office for your biopsy report after 1 week if you have not already been contacted.  If you experience any problems, such as abnormal amounts of bleeding, swelling, significant bruising, significant pain, or evidence of infection, please call the office immediately.  FOR ADULT SURGERY PATIENTS: If you need something for pain relief you may take 1 extra strength Tylenol (acetaminophen) AND 2 Ibuprofen (200mg each) together every 4 hours as needed for pain. (do not take these if you are allergic to them or if you have a reason you should not take them.) Typically, you may only need pain medication for 1 to 3 days.   If you have any questions or concerns for your doctor, please call our main line at [phone] and press option 4 to reach your doctor's medical assistant. If no one answers, please leave a voicemail as directed and we will return your call as soon as possible. Messages left after 4 pm will be answered the following business day.   You may also send us a message via MyChart. We typically respond to MyChart messages within 1-2 business days.  For prescription refills, please ask your pharmacy to contact our office. Our fax number is [phone].  If you have an urgent issue when the clinic is closed that  cannot wait until the next business day, you can page your doctor at the number below.    Please note that while we do our best to be available for urgent issues outside of office hours, we are not available 24/7.   If you have an urgent issue and are unable to reach us, you may choose to seek medical care at your doctor's office, retail clinic, urgent care center, or emergency room.  If you have a medical emergency, please immediately call 911 or go to the emergency department.  Pager Numbers  - Dr. Kowalski: [phone]  - Dr. Moye: [phone]  - Dr. Stewart: [phone]  In the event of inclement weather, please call our main line at [phone] for an update on the status of any delays or closures.  Dermatology Medication Tips: Please keep the boxes that topical medications come in in order to help keep track of the instructions about where and how to use these. Pharmacies typically print the medication instructions only on the boxes and not directly on the medication tubes.   If your medication is too expensive, please contact our office at [phone] option 4 or send us a message through MyChart.   We are unable to tell what your co-pay for medications will be in advance as this is different depending on your insurance coverage. However, we may be able to find a substitute medication at lower cost or fill out paperwork to get insurance to cover a needed   medication.   If a prior authorization is required to get your medication covered by your insurance company, please allow us 1-2 business days to complete this process.  Drug prices often vary depending on where the prescription is filled and some pharmacies may offer cheaper prices.  The website www.goodrx.com contains coupons for medications through different pharmacies. The prices here do not account for what the cost may be with help from insurance (it may be cheaper with your insurance), but the website can give you the  price if you did not use any insurance.  - You can print the associated coupon and take it with your prescription to the pharmacy.  - You may also stop by our office during regular business hours and pick up a GoodRx coupon card.  - If you need your prescription sent electronically to a different pharmacy, notify our office through [place] MyChart or by phone at [phone] option 4.   

## 2021-04-08 NOTE — Progress Notes (Signed)
   Follow-Up Visit   Subjective  Mario Silva is a 55 y.o. male who presents for the following: Procedure (Patient here for excision of neoplasm at the left antecubital fossa. ).  Patient accompanied by sister.   The following portions of the chart were reviewed this encounter and updated as appropriate:   Tobacco  Allergies  Meds  Problems  Med Hx  Surg Hx  Fam Hx      Review of Systems:  No other skin or systemic complaints except as noted in HPI or Assessment and Plan.  Objective  Well appearing patient in no apparent distress; mood and affect are within normal limits.  A focused examination was performed including left arm. Relevant physical exam findings are noted in the Assessment and Plan.  Left Antecubital Fossa Firm subcutaneous nodule 2.8 cm      Assessment & Plan  Neoplasm of uncertain behavior of skin Left Antecubital Fossa  Skin excision  Lesion length (cm):  2.8 Total excision diameter (cm):  2.8 Informed consent: discussed and consent obtained   Timeout: patient name, date of birth, surgical site, and procedure verified   Procedure prep:  Patient was prepped and draped in usual sterile fashion Prep type:  Chlorhexidine Anesthesia: the lesion was anesthetized in a standard fashion   Anesthetic:  1% lidocaine w/ epinephrine 1-100,000 buffered w/ 8.4% NaHCO3 (3cc lido w/epi, 6cc 0.25% bupivicaine) Instrument used comment:  15c Hemostasis achieved with: suture, pressure and electrodesiccation   Outcome: patient tolerated procedure well with no complications   Post-procedure details: wound care instructions given   Additional details:  Mupirocin and a pressure dressing applied  Skin repair Complexity:  Intermediate Final length (cm):  3.2 Informed consent: discussed and consent obtained   Timeout: patient name, date of birth, surgical site, and procedure verified   Procedure prep:  Patient was prepped and draped in usual sterile fashion Prep type:   Chlorhexidine Anesthesia: the lesion was anesthetized in a standard fashion   Anesthetic:  1% lidocaine w/ epinephrine 1-100,000 local infiltration Reason for type of repair: reduce tension to allow closure, reduce the risk of dehiscence, infection, and necrosis, reduce subcutaneous dead space and avoid a hematoma and enhance both functionality and cosmetic results   Undermining: edges undermined and area extensively undermined   Subcutaneous layers (deep stitches):  Suture size:  3-0 Suture type: Vicryl (polyglactin 910)   Stitches:  Buried vertical mattress Fine/surface layer approximation (top stitches):  Suture size:  4-0 Suture type: Prolene (polypropylene)   Suture removal (days):  14 Hemostasis achieved with: suture, pressure and electrodesiccation Outcome: patient tolerated procedure well with no complications   Post-procedure details: wound care instructions given   Additional details:  Mupirocin and a pressure bandage applied   mupirocin ointment (BACTROBAN) 2 % Apply 1 application topically daily. With dressing changes  Specimen 1 - Surgical pathology Differential Diagnosis: r/o Calcified Cyst vs Calcinosis Cutis vs Osteoma Cutis vs Other  Check Margins: No Firm subcutaneous nodule 3.4 cm  Return in about 2 weeks (around 04/22/2021) for Suture Removal.  Graciella Belton, RMA, am acting as scribe for Forest Gleason, MD .  Documentation: I have reviewed the above documentation for accuracy and completeness, and I agree with the above.  Forest Gleason, MD

## 2021-04-09 ENCOUNTER — Telehealth: Payer: Self-pay

## 2021-04-09 NOTE — Telephone Encounter (Signed)
Spoke with patient's sister and he is doing fine after yesterday's surgery.Cherly Hensen

## 2021-04-10 ENCOUNTER — Ambulatory Visit: Payer: Medicare Other | Admitting: Family Medicine

## 2021-04-10 ENCOUNTER — Other Ambulatory Visit: Payer: Self-pay

## 2021-04-13 ENCOUNTER — Telehealth: Payer: Self-pay

## 2021-04-13 NOTE — Telephone Encounter (Signed)
-----   Message from Alfonso Patten, MD sent at 04/09/2021  5:12 PM EDT ----- Skin (M), left antecubital fossa PILOMATRICOMA  Benign growth, no additional treatment needed.  MAs please call. Thank you!

## 2021-04-13 NOTE — Telephone Encounter (Signed)
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

## 2021-04-16 ENCOUNTER — Encounter: Payer: Self-pay | Admitting: Dermatology

## 2021-04-20 ENCOUNTER — Ambulatory Visit: Payer: Medicare Other | Admitting: Family Medicine

## 2021-04-21 ENCOUNTER — Ambulatory Visit: Payer: Medicare Other | Admitting: Family Medicine

## 2021-04-22 ENCOUNTER — Encounter: Payer: Self-pay | Admitting: Dermatology

## 2021-04-22 ENCOUNTER — Ambulatory Visit (INDEPENDENT_AMBULATORY_CARE_PROVIDER_SITE_OTHER): Payer: Medicare Other | Admitting: Dermatology

## 2021-04-22 ENCOUNTER — Other Ambulatory Visit: Payer: Self-pay

## 2021-04-22 DIAGNOSIS — L2489 Irritant contact dermatitis due to other agents: Secondary | ICD-10-CM | POA: Diagnosis not present

## 2021-04-22 DIAGNOSIS — L249 Irritant contact dermatitis, unspecified cause: Secondary | ICD-10-CM

## 2021-04-22 MED ORDER — TRIAMCINOLONE ACETONIDE 0.1 % EX OINT: 1.0000 "application " | TOPICAL_OINTMENT | Freq: Two times a day (BID) | CUTANEOUS | 0 refills | Status: DC | PRN

## 2021-04-22 NOTE — Progress Notes (Signed)
   Follow-Up Visit   Subjective  Mario Silva is a 55 y.o. male who presents for the following: suture removal and rash  Patient accompanied by sister.  He does have an itchy rash at his left arm.  The following portions of the chart were reviewed this encounter and updated as appropriate:   Tobacco  Allergies  Meds  Problems  Med Hx  Surg Hx  Fam Hx      Review of Systems:  No other skin or systemic complaints except as noted in HPI or Assessment and Plan.  Objective  Well appearing patient in no apparent distress; mood and affect are within normal limits.  A focused examination was performed including left arm. Relevant physical exam findings are noted in the Assessment and Plan.  Left Upper Arm - Anterior Scaly pink plaque   Assessment & Plan  Irritant dermatitis Left Upper Arm - Anterior  Secondary to adhesive bandage  Start TMC 0.1% ointment twice a day as needed up to 2 weeks. Avoid applying to face, groin, and axilla. Use as directed. Long-term use can cause thinning of the skin.  Topical steroids (such as triamcinolone, fluocinolone, fluocinonide, mometasone, clobetasol, halobetasol, betamethasone, hydrocortisone) can cause thinning and lightening of the skin if they are used for too long in the same area. Your physician has selected the right strength medicine for your problem and area affected on the body. Please use your medication only as directed by your physician to prevent side effects.    triamcinolone ointment (KENALOG) 0.1 % - Left Upper Arm - Anterior Apply 1 application topically 2 (two) times daily as needed. For up to 2 weeks.Avoid applying to face, groin, and axilla. Use as directed. Long-term use can cause thinning of the skin.  Encounter for Removal of Sutures - Incision site at the left arm is clean, dry and intact - Wound cleansed, sutures removed, wound cleansed and steri strips applied.  - Discussed pathology results showing  pilomatricoma - Patient advised to keep steri-strips dry until they fall off. - Scars remodel for a full year. - Once steri-strips fall off, patient can apply over-the-counter silicone scar cream each night to help with scar remodeling if desired. - Patient advised to call with any concerns or if they notice any new or changing lesions.  Return for as scheduled.  Graciella Belton, RMA, am acting as scribe for Forest Gleason, MD .  Documentation: I have reviewed the above documentation for accuracy and completeness, and I agree with the above.  Forest Gleason, MD

## 2021-04-22 NOTE — Patient Instructions (Signed)
Start TMC 0.1% ointment twice a day as needed up to 2 weeks. Avoid applying to face, groin, and axilla. Use as directed. Long-term use can cause thinning of the skin.  Topical steroids (such as triamcinolone, fluocinolone, fluocinonide, mometasone, clobetasol, halobetasol, betamethasone, hydrocortisone) can cause thinning and lightening of the skin if they are used for too long in the same area. Your physician has selected the right strength medicine for your problem and area affected on the body. Please use your medication only as directed by your physician to prevent side effects.   If you have any questions or concerns for your doctor, please call our main line at [phone] and press option 4 to reach your doctor's medical assistant. If no one answers, please leave a voicemail as directed and we will return your call as soon as possible. Messages left after 4 pm will be answered the following business day.   You may also send [place] a message via [place]. We typically respond to MyChart messages within 1-2 business days.  For prescription refills, please ask your pharmacy to contact our office. Our fax number is [phone].  If you have an urgent issue when the clinic is closed that cannot wait until the next business day, you can page your doctor at the number below.    Please note that while we do our best to be available for urgent issues outside of office hours, we are not available 24/7.   If you have an urgent issue and are unable to reach [place], you may choose to seek medical care at your doctor's office, retail clinic, urgent care center, or emergency room.  If you have a medical emergency, please immediately call 911 or go to the emergency department.  Pager Numbers  - Dr. Nehemiah Massed: [phone]  - Dr. Laurence Ferrari: [phone]  - Dr. Nicole Kindred: [phone]  In the event of inclement weather, please call our main line at [phone] for an update on the status of any delays or  closures.  Dermatology Medication Tips: Please keep the boxes that topical medications come in in order to help keep track of the instructions about where and how to use these. Pharmacies typically print the medication instructions only on the boxes and not directly on the medication tubes.   If your medication is too expensive, please contact our office at [phone] option 4 or send [place] a message through [facility].   We are unable to tell what your co-pay for medications will be in advance as this is different depending on your insurance coverage. However, we may be able to find a substitute medication at lower cost or fill out paperwork to get insurance to cover a needed medication.   If a prior authorization is required to get your medication covered by your insurance company, please allow [place] 1-2 business days to complete this process.  Drug prices often vary depending on where the prescription is filled and some pharmacies may offer cheaper prices.  The website www.goodrx.com contains coupons for medications through different pharmacies. The prices here do not account for what the cost may be with help from insurance (it may be cheaper with your insurance), but the website can give you the price if you did not use any insurance.  - You can print the associated coupon and take it with your prescription to the pharmacy.  - You may also stop by our office during regular business hours and pick up a GoodRx coupon card.  - If you need your prescription  sent electronically to a different pharmacy, notify our office through [facility] or by phone at [phone] option 4.

## 2021-04-25 DIAGNOSIS — G4733 Obstructive sleep apnea (adult) (pediatric): Secondary | ICD-10-CM | POA: Diagnosis not present

## 2021-04-27 ENCOUNTER — Encounter: Payer: Self-pay | Admitting: Family Medicine

## 2021-04-27 ENCOUNTER — Ambulatory Visit (INDEPENDENT_AMBULATORY_CARE_PROVIDER_SITE_OTHER): Payer: Medicare Other | Admitting: Family Medicine

## 2021-04-27 ENCOUNTER — Other Ambulatory Visit: Payer: Self-pay

## 2021-04-27 VITALS — BP 120/79 | HR 68 | Ht 76.0 in | Wt 323.6 lb

## 2021-04-27 DIAGNOSIS — F3341 Major depressive disorder, recurrent, in partial remission: Secondary | ICD-10-CM

## 2021-04-27 DIAGNOSIS — K219 Gastro-esophageal reflux disease without esophagitis: Secondary | ICD-10-CM | POA: Diagnosis not present

## 2021-04-27 DIAGNOSIS — F70 Mild intellectual disabilities: Secondary | ICD-10-CM

## 2021-04-27 DIAGNOSIS — F819 Developmental disorder of scholastic skills, unspecified: Secondary | ICD-10-CM | POA: Diagnosis not present

## 2021-04-27 DIAGNOSIS — F419 Anxiety disorder, unspecified: Secondary | ICD-10-CM

## 2021-04-27 DIAGNOSIS — Z23 Encounter for immunization: Secondary | ICD-10-CM | POA: Diagnosis not present

## 2021-04-27 MED ORDER — PANTOPRAZOLE SODIUM 40 MG PO TBEC: 40.0000 mg | DELAYED_RELEASE_TABLET | Freq: Every day | ORAL | 1 refills | Status: DC

## 2021-04-27 MED ORDER — ARIPIPRAZOLE 5 MG PO TABS: ORAL_TABLET | ORAL | 1 refills | Status: DC

## 2021-04-27 MED ORDER — LORAZEPAM 0.5 MG PO TABS: 0.2500 mg | ORAL_TABLET | Freq: Every day | ORAL | 1 refills | Status: DC | PRN

## 2021-04-27 NOTE — Patient Instructions (Addendum)
Thank you for coming to the office today.  Flu Shot today.  Start acid reflux medication Pantoprazole 40mg  daily - take before breakfast 30 min. - every day.  Refilled the as needed Lorazepam  Re ordered the Abilify  The leg symptoms likely from a nerve, can be pinched and usually related to sleeping position, stretching usually resolves this. Can also consider it a restless leg if it continues.  Please schedule a Follow-up Appointment to: Return in about 6 months (around 10/25/2021) for 6 month follow-up - establish w/ Rollene Fare - previous PCP Elmyra Ricks.  If you have any other questions or concerns, please feel free to call the office or send a message through [place]. You may also schedule an earlier appointment if necessary.  Additionally, you may be receiving a survey about your experience at our office within a few days to 1 week by e-mail or mail. We value your feedback.  Nobie Putnam, DO [place]

## 2021-04-27 NOTE — Progress Notes (Signed)
Subjective:    Patient ID: Mario Silva, male    DOB: 20-Feb-1966, 55 y.o.   MRN: [id number]  Mario Silva is a 55 y.o. male presenting on 04/27/2021 for Depression and Anxiety  Patient previous followed with Cyndia Skeeters, FNP   Living at [place] several days a week, and then will stay with family on other days.  HPI  Major Depression recurrent / Anxiety He has not seen psychiatry. He takes Abilify 5mg  daily and Lorazepam 0.5mg  PRN. Previous PCP was agreeable to continuing this regimen. He rarely takes Lorazepam, request refill now since it has been almost 6 month since last. Recent health issue made him more anxious. Also asking about abilify dosing for his mood.  Episodic Vertigo / Inner Ear Problem Episodes of vertigo on occasion, has improved with Meclizine PRN.  GERD Chronic problem has Pantoprazole 40mg  daily however he was only taking this 3 times a week. He has upset stomach and digestive problems often. He has taken Gas X PRN for indigestion  Health Maintenance: Due for Flu Shot, will receive today    Depression screen [facility] 2/9 01/13/2021 10/03/2020 12/11/2019  Decreased Interest 0 0 0  Down, Depressed, Hopeless 0 2 1  PHQ - 2 Score 0 2 1  Altered sleeping - 0 -  Tired, decreased energy - 0 -  Change in appetite - 0 -  Feeling bad or failure about yourself  - 0 -  Trouble concentrating - 3 -  Moving slowly or fidgety/restless - 0 -  Suicidal thoughts - 0 -  PHQ-9 Score - 5 -  Difficult doing work/chores - Not difficult at all -    Social History   Tobacco Use   Smoking status: Never   Smokeless tobacco: Never  Vaping Use   Vaping Use: Never used  Substance Use Topics   Alcohol use: No   Drug use: No    Review of Systems Per HPI unless specifically indicated above     Objective:    BP 120/79   Pulse 68   Ht 6\' 4"  (1.93 m)   Wt (!) 323 lb 9.6 oz (146.8 kg)   SpO2 97%   BMI 39.39 kg/m   Wt Readings from Last 3 Encounters:  04/27/21 (!) 323 lb  9.6 oz (146.8 kg)  01/13/21 290 lb (131.5 kg)  10/03/20 (!) 325 lb 3.2 oz (147.5 kg)    Physical Exam Vitals and nursing note reviewed.  Constitutional:      General: He is not in acute distress.    Appearance: Normal appearance. He is well-developed. He is not diaphoretic.     Comments: Well-appearing, comfortable, cooperative  HENT:     Head: Normocephalic and atraumatic.  Eyes:     General:        Right eye: No discharge.        Left eye: No discharge.     Conjunctiva/sclera: Conjunctivae normal.  Cardiovascular:     Rate and Rhythm: Normal rate.  Pulmonary:     Effort: Pulmonary effort is normal.  Skin:    General: Skin is warm and dry.     Findings: No erythema or rash.  Neurological:     Mental Status: He is alert and oriented to person, place, and time.  Psychiatric:        Mood and Affect: Mood normal.        Behavior: Behavior normal.        Thought Content: Thought content normal.  Comments: Well groomed, good eye contact, normal speech and thoughts     Results for orders placed or performed in visit on 10/03/20  HIV Antibody (routine testing w rflx)  Result Value Ref Range   HIV 1&2 Ab, 4th Generation NON-REACTIVE NON-REACTI  TSH  Result Value Ref Range   TSH 1.98 0.40 - 4.50 mIU/L  Comprehensive metabolic panel  Result Value Ref Range   Glucose, Bld 89 65 - 99 mg/dL   BUN 13 7 - 25 mg/dL   Creat 0.75 0.70 - 1.33 mg/dL   BUN/Creatinine Ratio NOT APPLICABLE 6 - 22 (calc)   Sodium 142 135 - 146 mmol/L   Potassium 4.5 3.5 - 5.3 mmol/L   Chloride 107 98 - 110 mmol/L   CO2 29 20 - 32 mmol/L   Calcium 9.6 8.6 - 10.3 mg/dL   Total Protein 6.6 6.1 - 8.1 g/dL   Albumin 4.3 3.6 - 5.1 g/dL   Globulin 2.3 1.9 - 3.7 g/dL (calc)   AG Ratio 1.9 1.0 - 2.5 (calc)   Total Bilirubin 0.5 0.2 - 1.2 mg/dL   Alkaline phosphatase (APISO) 57 35 - 144 U/L   AST 18 10 - 35 U/L   ALT 20 9 - 46 U/L  CBC with Differential/Platelet  Result Value Ref Range   WBC 7.4 3.8 -  10.8 Thousand/uL   RBC 5.52 4.20 - 5.80 Million/uL   Hemoglobin 15.8 13.2 - 17.1 g/dL   HCT 48.8 38.5 - 50.0 %   MCV 88.4 80.0 - 100.0 fL   MCH 28.6 27.0 - 33.0 pg   MCHC 32.4 32.0 - 36.0 g/dL   RDW 13.1 11.0 - 15.0 %   Platelets 258 140 - 400 Thousand/uL   MPV 10.8 7.5 - 12.5 fL   Neutro Abs 3,604 1,500 - 7,800 cells/uL   Lymphs Abs 2,916 850 - 3,900 cells/uL   Absolute Monocytes 688 200 - 950 cells/uL   Eosinophils Absolute 141 15 - 500 cells/uL   Basophils Absolute 52 0 - 200 cells/uL   Neutrophils Relative % 48.7 %   Total Lymphocyte 39.4 %   Monocytes Relative 9.3 %   Eosinophils Relative 1.9 %   Basophils Relative 0.7 %  Hepatitis C Antibody  Result Value Ref Range   Hepatitis C Ab NON-REACTIVE NON-REACTI   SIGNAL TO CUT-OFF 0.01 <1.00  HgB A1c  Result Value Ref Range   Hgb A1c MFr Bld 5.4 <5.7 % of total Hgb   Mean Plasma Glucose 108 mg/dL   eAG (mmol/L) 6.0 mmol/L  Lipid Panel w/reflex Direct LDL  Result Value Ref Range   Cholesterol 175 <200 mg/dL   HDL 46 > OR = 40 mg/dL   Triglycerides 233 (H) <150 mg/dL   LDL Cholesterol (Calc) 97 mg/dL (calc)   Total CHOL/HDL Ratio 3.8 <5.0 (calc)   Non-HDL Cholesterol (Calc) 129 <130 mg/dL (calc)      Assessment & Plan:   Problem List Items Addressed This Visit     Recurrent major depressive disorder, in partial remission (HCC) - Primary   Relevant Medications   ARIPiprazole (ABILIFY) 5 MG tablet   LORazepam (ATIVAN) 0.5 MG tablet   Mild intellectual disability   Gastroesophageal reflux disease   Relevant Medications   pantoprazole (PROTONIX) 40 MG tablet   Cognitive developmental delay   Anxiety   Relevant Medications   LORazepam (ATIVAN) 0.5 MG tablet   Other Visit Diagnoses     Needs flu shot  Relevant Orders   Flu Vaccine QUAD 38mo+IM (Fluarix, Fluzone & Alfiuria Quad PF)       Recurrent major depression, partial remission Generalized Anxiety Intellectual Disability, mild Cognitive Developmental  Delay  Reviewed past history. He currently is doing fairly well and symptoms mostly managed on current medications Continue to refill Abilify 5mg  daily, sent 90 day with +1 refill Agree to refill Lorazepam 0.5mg  taking half or whole tab PRN rarely, confirmed w/ PDMP very infrequent rx filled Staying in [place] several days a week then also with family, has good support system  GERD Will increase PPI to daily use not PRN and also okay to take if has meal soon after. Refill Pantoprazole 40mg  daily   Meds ordered this encounter  Medications   ARIPiprazole (ABILIFY) 5 MG tablet    Sig: Take 1 tablet daily    Dispense:  90 tablet    Refill:  1   LORazepam (ATIVAN) 0.5 MG tablet    Sig: Take 0.5-1 tablets (0.25-0.5 mg total) by mouth daily as needed for anxiety.    Dispense:  30 tablet    Refill:  1   pantoprazole (PROTONIX) 40 MG tablet    Sig: Take 1 tablet (40 mg total) by mouth daily before breakfast. For stomach acid    Dispense:  90 tablet    Refill:  1      Follow up plan: Return in about 6 months (around 10/25/2021) for 6 month follow-up - establish w/ Rollene Fare - previous PCP Elmyra Ricks.   Nobie Putnam, [facility] 04/27/2021, 2:24 PM

## 2021-05-28 ENCOUNTER — Encounter: Payer: Self-pay | Admitting: Podiatry

## 2021-05-28 ENCOUNTER — Ambulatory Visit (INDEPENDENT_AMBULATORY_CARE_PROVIDER_SITE_OTHER): Payer: Medicare Other | Admitting: Podiatry

## 2021-05-28 ENCOUNTER — Other Ambulatory Visit: Payer: Self-pay

## 2021-05-28 DIAGNOSIS — Q828 Other specified congenital malformations of skin: Secondary | ICD-10-CM | POA: Diagnosis not present

## 2021-05-28 DIAGNOSIS — I872 Venous insufficiency (chronic) (peripheral): Secondary | ICD-10-CM

## 2021-05-28 DIAGNOSIS — M79675 Pain in left toe(s): Secondary | ICD-10-CM

## 2021-05-28 DIAGNOSIS — M79674 Pain in right toe(s): Secondary | ICD-10-CM

## 2021-05-28 DIAGNOSIS — L84 Corns and callosities: Secondary | ICD-10-CM

## 2021-05-28 DIAGNOSIS — B351 Tinea unguium: Secondary | ICD-10-CM

## 2021-05-28 NOTE — Progress Notes (Signed)
This patient returns to my office for at risk foot care.  This patient requires this care by a professional since this patient will be at risk due to having venous stasis dermatitis  B/L. This patient is accompanied by caregiver.  This patient is unable to cut nails himself since the patient cannot reach his nails.These nails are painful walking and wearing shoes.  This patient presents for at risk foot care today.  General Appearance  Alert, conversant and in no acute stress.  Vascular  Dorsalis pedis and posterior tibial  pulses are palpable  bilaterally.  Capillary return is within normal limits  bilaterally. Temperature is within normal limits  bilaterally.  Neurologic  Senn-Weinstein monofilament wire test within normal limits  bilaterally. Muscle power within normal limits bilaterally.  Nails Thick disfigured discolored nails with subungual debris  from hallux to fifth toes bilaterally. No evidence of bacterial infection or drainage bilaterally.  Orthopedic  No limitations of motion  feet .  No crepitus or effusions noted.  Hallux malleus  B/L.  Hammer toes  B/L.  Skin  normotropic skin with no porokeratosis noted bilaterally.  No signs of infections or ulcers noted.   Pinch callus  B/L.  Porokeratosis lateral aspect fifth metabase left foot.  Onychomycosis  Pain in right toes  Pain in left toes  Pinch callus  B/l. Porokeratosis left foot.  Consent was obtained for treatment procedures.   Mechanical debridement of nails 1-5  bilaterally performed with a nail nipper.  Filed with dremel without incident. Debride callus with # 15 blade.   Return office visit   3 months                  Told patient to return for periodic foot care and evaluation due to potential at risk complications.   Gardiner Barefoot DPM

## 2021-07-24 DIAGNOSIS — G4733 Obstructive sleep apnea (adult) (pediatric): Secondary | ICD-10-CM | POA: Diagnosis not present

## 2021-08-13 ENCOUNTER — Ambulatory Visit (INDEPENDENT_AMBULATORY_CARE_PROVIDER_SITE_OTHER): Payer: Medicare Other | Admitting: Podiatry

## 2021-08-13 ENCOUNTER — Encounter: Payer: Self-pay | Admitting: Podiatry

## 2021-08-13 ENCOUNTER — Other Ambulatory Visit: Payer: Self-pay

## 2021-08-13 DIAGNOSIS — M79675 Pain in left toe(s): Secondary | ICD-10-CM

## 2021-08-13 DIAGNOSIS — L84 Corns and callosities: Secondary | ICD-10-CM | POA: Diagnosis not present

## 2021-08-13 DIAGNOSIS — I872 Venous insufficiency (chronic) (peripheral): Secondary | ICD-10-CM | POA: Diagnosis not present

## 2021-08-13 DIAGNOSIS — B351 Tinea unguium: Secondary | ICD-10-CM

## 2021-08-13 DIAGNOSIS — M79674 Pain in right toe(s): Secondary | ICD-10-CM | POA: Diagnosis not present

## 2021-08-13 DIAGNOSIS — M2042 Other hammer toe(s) (acquired), left foot: Secondary | ICD-10-CM

## 2021-08-13 NOTE — Progress Notes (Signed)
This patient returns to my office for at risk foot care.  This patient requires this care by a professional since this patient will be at risk due to having venous stasis dermatitis  B/L. This patient is accompanied by caregiver.  This patient is unable to cut nails himself since the patient cannot reach his nails.These nails are painful walking and wearing shoes.  He has painful callus both big toes.  This patient presents for at risk foot care today.  General Appearance  Alert, conversant and in no acute stress.  Vascular  Dorsalis pedis and posterior tibial  pulses are palpable  bilaterally.  Capillary return is within normal limits  bilaterally. Temperature is within normal limits  bilaterally.  Neurologic  Senn-Weinstein monofilament wire test within normal limits  bilaterally. Muscle power within normal limits bilaterally.  Nails Thick disfigured discolored nails with subungual debris  from hallux to fifth toes bilaterally. No evidence of bacterial infection or drainage bilaterally.  Orthopedic  No limitations of motion  feet .  No crepitus or effusions noted.  Hallux malleus  B/L.  Hammer toes  B/L.  Skin  normotropic skin with no porokeratosis noted bilaterally.  No signs of infections or ulcers noted.   Pinch callus  B/L  Onychomycosis  Pain in right toes  Pain in left toes  Pinch callus  B/l.   Consent was obtained for treatment procedures.   Mechanical debridement of nails 1-5  bilaterally performed with a nail nipper.  Filed with dremel without incident. Debride callus with # 15 blade.   Return office visit   3 months                  Told patient to return for periodic foot care and evaluation due to potential at risk complications.   Ernestina Joe DPM  

## 2021-09-22 ENCOUNTER — Ambulatory Visit (INDEPENDENT_AMBULATORY_CARE_PROVIDER_SITE_OTHER): Payer: Medicare Other | Admitting: Family Medicine

## 2021-09-22 ENCOUNTER — Other Ambulatory Visit: Payer: Self-pay | Admitting: Family Medicine

## 2021-09-22 ENCOUNTER — Other Ambulatory Visit: Payer: Self-pay

## 2021-09-22 ENCOUNTER — Encounter: Payer: Self-pay | Admitting: Family Medicine

## 2021-09-22 DIAGNOSIS — Z0000 Encounter for general adult medical examination without abnormal findings: Secondary | ICD-10-CM

## 2021-09-22 DIAGNOSIS — G8929 Other chronic pain: Secondary | ICD-10-CM

## 2021-09-22 DIAGNOSIS — F70 Mild intellectual disabilities: Secondary | ICD-10-CM

## 2021-09-22 DIAGNOSIS — K219 Gastro-esophageal reflux disease without esophagitis: Secondary | ICD-10-CM

## 2021-09-22 DIAGNOSIS — F3341 Major depressive disorder, recurrent, in partial remission: Secondary | ICD-10-CM

## 2021-09-22 DIAGNOSIS — F819 Developmental disorder of scholastic skills, unspecified: Secondary | ICD-10-CM

## 2021-09-22 DIAGNOSIS — R351 Nocturia: Secondary | ICD-10-CM

## 2021-09-22 DIAGNOSIS — R7303 Prediabetes: Secondary | ICD-10-CM

## 2021-09-22 DIAGNOSIS — M25562 Pain in left knee: Secondary | ICD-10-CM | POA: Diagnosis not present

## 2021-09-22 DIAGNOSIS — E782 Mixed hyperlipidemia: Secondary | ICD-10-CM

## 2021-09-22 MED ORDER — ARIPIPRAZOLE 5 MG PO TABS: ORAL_TABLET | ORAL | 3 refills | Status: DC

## 2021-09-22 MED ORDER — DICLOFENAC SODIUM 1 % EX GEL: 2.0000 g | Freq: Four times a day (QID) | CUTANEOUS | 2 refills | Status: DC | PRN

## 2021-09-22 MED ORDER — PANTOPRAZOLE SODIUM 40 MG PO TBEC: 40.0000 mg | DELAYED_RELEASE_TABLET | Freq: Every day | ORAL | 3 refills | Status: DC

## 2021-09-22 NOTE — Progress Notes (Signed)
? ?Subjective:  ? ? Patient ID: Mario Silva, male    DOB: Nov 25, 1965, 56 y.o.   MRN: [id number] ? ?Mario Silva is a 56 y.o. male presenting on 09/22/2021 for Depression ? ? ?HPI ? ?Living at [place] several days a week, and then will stay with family on other days. ?  ?HPI ? ?Left Knee Pain ?Worse with inner part of knee. Pain inc with prolonged sedentary seated with knee flexed. Worse with getting up and moving after, some pain in knee inner part radiating into lower leg. ?  ?Major Depression recurrent / Anxiety ?Continues on Abilify '5mg'$  daily ?Has Lorazepam PRN if need ? ?Episodic Vertigo / Inner Ear Problem ?Episodes of vertigo on occasion, has improved with Meclizine PRN. ?  ?GERD ?Chronic problem has Pantoprazole '40mg'$  daily however he was only taking this 3 times a week. ?He has upset stomach and digestive problems often. ?He has taken Gas X PRN for indigestion ? ? ?Health Maintenance: ?UTD reviewed colonoscopy screening, vaccines. ? ? ?  09/22/2021  ?  8:47 AM 01/13/2021  ?  3:29 PM 10/03/2020  ?  8:11 AM  ?Depression screen PHQ 2/9  ?Decreased Interest 0 0 0  ?Down, Depressed, Hopeless 0 0 2  ?PHQ - 2 Score 0 0 2  ?Altered sleeping 0  0  ?Tired, decreased energy 0  0  ?Change in appetite 0  0  ?Feeling bad or failure about yourself  0  0  ?Trouble concentrating 0  3  ?Moving slowly or fidgety/restless 0  0  ?Suicidal thoughts 0  0  ?PHQ-9 Score 0  5  ?Difficult doing work/chores Not difficult at all  Not difficult at all  ? ? ?Past Medical History:  ?Diagnosis Date  ? Acid reflux   ? Actinic keratosis 08/30/2019  ? right eyebrow, bx proven  ? Anxiety   ? Depression   ? Mental retardation   ? Restless leg syndrome   ? Sleep apnea   ? ?Past Surgical History:  ?Procedure Laterality Date  ? COLONOSCOPY    ? COLONOSCOPY WITH PROPOFOL N/A 02/08/2018  ? Procedure: COLONOSCOPY WITH PROPOFOL;  Surgeon: Robert Bellow, MD;  Location: [facility] ENDOSCOPY;  Service: General;  Laterality: N/A;  ? diviated septum    ? NOSE  SURGERY    ? ?Social History  ? ?Socioeconomic History  ? Marital status: Single  ?  Spouse name: Not on file  ? Number of children: Not on file  ? Years of education: Not on file  ? Highest education level: Not on file  ?Occupational History  ? Not on file  ?Tobacco Use  ? Smoking status: Never  ? Smokeless tobacco: Never  ?Vaping Use  ? Vaping Use: Never used  ?Substance and Sexual Activity  ? Alcohol use: No  ? Drug use: No  ? Sexual activity: Not on file  ?Other Topics Concern  ? Not on file  ?Social History Narrative  ? ** Merged History Encounter **  ?    ? ?Social Determinants of Health  ? ?Financial Resource Strain: Low Risk   ? Difficulty of Paying Living Expenses: Not hard at all  ?Food Insecurity: No Food Insecurity  ? Worried About Charity fundraiser in the Last Year: Never true  ? Ran Out of Food in the Last Year: Never true  ?Transportation Needs: No Transportation Needs  ? Lack of Transportation (Medical): No  ? Lack of Transportation (Non-Medical): No  ?Physical Activity: Sufficiently Active  ?  Days of Exercise per Week: 5 days  ? Minutes of Exercise per Session: 30 min  ?Stress: No Stress Concern Present  ? Feeling of Stress : Not at all  ?Social Connections: Not on file  ?Intimate Partner Violence: Not on file  ? ?Family History  ?Problem Relation Age of Onset  ? Hypertension Mother   ? Hyperlipidemia Mother   ? Anxiety disorder Mother   ? Thyroid disease Mother   ? ?Current Outpatient Medications on File Prior to Visit  ?Medication Sig  ? azelastine (ASTELIN) 0.1 % nasal spray Place into both nostrils.  ? b complex vitamins tablet Take 1 tablet by mouth daily.  ? calcipotriene (DOVONOX) 0.005 % cream Apply topically 2 (two) times daily.  ? Calcium Polycarbophil (FIBER-CAPS PO) Take by mouth.  ? Cholecalciferol (VITAMIN D3 PO) Take by mouth.  ? ciclopirox (LOPROX) 0.77 % cream For fungal infection - apply to feet BID until scale has resolved. Then, continue BID for one more week after that.  Afterwards, continue QW to prevent recurrence.  ? LORazepam (ATIVAN) 0.5 MG tablet Take 0.5-1 tablets (0.25-0.5 mg total) by mouth daily as needed for anxiety.  ? magnesium oxide (MAG-OX) 400 MG tablet Take 400 mg by mouth daily.  ? meclizine (ANTIVERT) 25 MG tablet Take 1 tablet (25 mg total) by mouth 3 (three) times daily as needed for dizziness.  ? mupirocin ointment (BACTROBAN) 2 % Apply 1 application topically daily. With dressing changes  ? Probiotic Product (PROBIOTIC DAILY PO) Take by mouth.  ? triamcinolone ointment (KENALOG) 0.1 % Apply 1 application topically 2 (two) times daily as needed. For up to 2 weeks.Avoid applying to face, groin, and axilla. Use as directed. Long-term use can cause thinning of the skin.  ? ?No current facility-administered medications on file prior to visit.  ? ? ?Review of Systems ?Per HPI unless specifically indicated above ? ? ? ?   ?Objective:  ?  ?BP 124/86 (BP Location: Left Arm, Cuff Size: Normal)   Pulse 68   Ht '6\' 4"'$  (1.93 m)   Wt (!) 328 lb 6.4 oz (149 kg)   SpO2 97%   BMI 39.97 kg/m?   ?Wt Readings from Last 3 Encounters:  ?09/22/21 (!) 328 lb 6.4 oz (149 kg)  ?04/27/21 (!) 323 lb 9.6 oz (146.8 kg)  ?01/13/21 290 lb (131.5 kg)  ?  ?Physical Exam ?Vitals and nursing note reviewed.  ?Constitutional:   ?   General: He is not in acute distress. ?   Appearance: He is well-developed. He is obese. He is not diaphoretic.  ?   Comments: Well-appearing, comfortable, cooperative  ?HENT:  ?   Head: Normocephalic and atraumatic.  ?Eyes:  ?   General:     ?   Right eye: No discharge.     ?   Left eye: No discharge.  ?   Conjunctiva/sclera: Conjunctivae normal.  ?Neck:  ?   Thyroid: No thyromegaly.  ?Cardiovascular:  ?   Rate and Rhythm: Normal rate and regular rhythm.  ?   Pulses: Normal pulses.  ?   Heart sounds: Normal heart sounds. No murmur heard. ?Pulmonary:  ?   Effort: Pulmonary effort is normal. No respiratory distress.  ?   Breath sounds: Normal breath sounds. No  wheezing or rales.  ?Musculoskeletal:     ?   General: Normal range of motion.  ?   Cervical back: Normal range of motion and neck supple.  ?   Comments: Left knee some  medial pain over joint, has some crepitus. No edema  ?Lymphadenopathy:  ?   Cervical: No cervical adenopathy.  ?Skin: ?   General: Skin is warm and dry.  ?   Findings: No erythema or rash.  ?Neurological:  ?   Mental Status: He is alert and oriented to person, place, and time. Mental status is at baseline.  ?Psychiatric:     ?   Behavior: Behavior normal.  ?   Comments: Well groomed, good eye contact, normal speech and thoughts  ? ? ? ?Results for orders placed or performed in visit on 10/03/20  ?HIV Antibody (routine testing w rflx)  ?Result Value Ref Range  ? HIV 1&2 Ab, 4th Generation NON-REACTIVE NON-REACTI  ?TSH  ?Result Value Ref Range  ? TSH 1.98 0.40 - 4.50 mIU/L  ?Comprehensive metabolic panel  ?Result Value Ref Range  ? Glucose, Bld 89 65 - 99 mg/dL  ? BUN 13 7 - 25 mg/dL  ? Creat 0.75 0.70 - 1.33 mg/dL  ? BUN/Creatinine Ratio NOT APPLICABLE 6 - 22 (calc)  ? Sodium 142 135 - 146 mmol/L  ? Potassium 4.5 3.5 - 5.3 mmol/L  ? Chloride 107 98 - 110 mmol/L  ? CO2 29 20 - 32 mmol/L  ? Calcium 9.6 8.6 - 10.3 mg/dL  ? Total Protein 6.6 6.1 - 8.1 g/dL  ? Albumin 4.3 3.6 - 5.1 g/dL  ? Globulin 2.3 1.9 - 3.7 g/dL (calc)  ? AG Ratio 1.9 1.0 - 2.5 (calc)  ? Total Bilirubin 0.5 0.2 - 1.2 mg/dL  ? Alkaline phosphatase (APISO) 57 35 - 144 U/L  ? AST 18 10 - 35 U/L  ? ALT 20 9 - 46 U/L  ?CBC with Differential/Platelet  ?Result Value Ref Range  ? WBC 7.4 3.8 - 10.8 Thousand/uL  ? RBC 5.52 4.20 - 5.80 Million/uL  ? Hemoglobin 15.8 13.2 - 17.1 g/dL  ? HCT 48.8 38.5 - 50.0 %  ? MCV 88.4 80.0 - 100.0 fL  ? MCH 28.6 27.0 - 33.0 pg  ? MCHC 32.4 32.0 - 36.0 g/dL  ? RDW 13.1 11.0 - 15.0 %  ? Platelets 258 140 - 400 Thousand/uL  ? MPV 10.8 7.5 - 12.5 fL  ? Neutro Abs 3,604 1,500 - 7,800 cells/uL  ? Lymphs Abs 2,916 850 - 3,900 cells/uL  ? Absolute Monocytes 688 200 -  950 cells/uL  ? Eosinophils Absolute 141 15 - 500 cells/uL  ? Basophils Absolute 52 0 - 200 cells/uL  ? Neutrophils Relative % 48.7 %  ? Total Lymphocyte 39.4 %  ? Monocytes Relative 9.3 %  ? Eosinophils Relative 1.9 %

## 2021-09-22 NOTE — Patient Instructions (Addendum)
Thank you for coming to the office today. ? ?For the Shingles vaccine, can get here or at pharmacy, Shingrix 2 doses 2-6 months apart to prevent nerve damage from shingles. ? ?Left knee pain appears to be in pain from arthritis and tendonitis, worse if fixed position sitting for long periods of time, goal is to stay active and get up and take breaks and move around. Can use compression sleeve for pain reduction. ? ?Use topical voltaren generic diclofenac pain relief cream up to max 4 times a day as needed. ? ?Recommend to start taking Tylenol Extra Strength '500mg'$  tabs - take 1 to 2 tabs per dose (max '1000mg'$ ) every 6-8 hours for pain (take regularly, don't skip a dose for next 7 days), max 24 hour daily dose is 6 tablets or '3000mg'$ . In the future you can repeat the same everyday Tylenol course for 1-2 weeks at a time.  ? ?In future if still bothering, can also be pinched nerve leg pain - we can order a Gabapentin if need. ? ? ?DUE for FASTING BLOOD WORK (no food or drink after midnight before the lab appointment, only water or coffee without cream/sugar on the morning of) ? ?SCHEDULE "Lab Only" visit in the morning at the clinic for lab draw in 3 MONTHS  ? ?- Make sure Lab Only appointment is at about 1 week before your next appointment, so that results will be available ? ?For Lab Results, once available within 2-3 days of blood draw, you can can log in to MyChart online to view your results and a brief explanation. Also, we can discuss results at next follow-up visit. ? ? ?Please schedule a Follow-up Appointment to: Return in about 3 months (around 12/23/2021) for 3 month fasting lab only then 1 week later Annual Physical. ? ?If you have any other questions or concerns, please feel free to call the office or send a message through [place]. You may also schedule an earlier appointment if necessary. ? ?Additionally, you may be receiving a survey about your experience at our office within a few days to 1 week by e-mail  or mail. We value your feedback. ? ?Nobie Putnam, DO ?[place] ?

## 2021-09-22 NOTE — Assessment & Plan Note (Signed)
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 

## 2021-09-22 NOTE — Assessment & Plan Note (Signed)
Controlled on PPI ?Pantoprazole '40mg'$  daily ?

## 2021-09-28 ENCOUNTER — Telehealth: Payer: Self-pay | Admitting: Family Medicine

## 2021-09-28 DIAGNOSIS — G8929 Other chronic pain: Secondary | ICD-10-CM

## 2021-09-28 NOTE — Telephone Encounter (Signed)
Mario Silva with pt's group home is calling in to update provider. Pt was told that Rx for diclofenac Sodium (VOLTAREN) 1 %  wasn't received by his pharmacy. Pt would like further assistance with receiving. ? ? ?Please assist further.  ? ? ?CB: [phone]- Mario Silva  ?

## 2021-09-29 NOTE — Telephone Encounter (Signed)
[facility] called and spoke to [place], [facility] about the refill(s) Diclofenac Gel requested. Advised it was sent on 09/22/21. He says it didn't go through on that date, but is able to refill today for $4.50. Curtis with the group home called and advised, he says he will let the patient's sister know to pick it up.  ?

## 2021-10-21 DIAGNOSIS — G4733 Obstructive sleep apnea (adult) (pediatric): Secondary | ICD-10-CM | POA: Diagnosis not present

## 2021-10-26 ENCOUNTER — Ambulatory Visit: Payer: Medicare Other | Admitting: Internal Medicine

## 2021-11-16 ENCOUNTER — Ambulatory Visit (INDEPENDENT_AMBULATORY_CARE_PROVIDER_SITE_OTHER): Payer: Medicare Other | Admitting: Podiatry

## 2021-11-16 ENCOUNTER — Encounter: Payer: Self-pay | Admitting: Podiatry

## 2021-11-16 DIAGNOSIS — M79674 Pain in right toe(s): Secondary | ICD-10-CM | POA: Diagnosis not present

## 2021-11-16 DIAGNOSIS — I872 Venous insufficiency (chronic) (peripheral): Secondary | ICD-10-CM

## 2021-11-16 DIAGNOSIS — B351 Tinea unguium: Secondary | ICD-10-CM | POA: Diagnosis not present

## 2021-11-16 DIAGNOSIS — M79675 Pain in left toe(s): Secondary | ICD-10-CM

## 2021-11-16 DIAGNOSIS — L84 Corns and callosities: Secondary | ICD-10-CM | POA: Diagnosis not present

## 2021-11-16 NOTE — Progress Notes (Signed)
This patient returns to my office for at risk foot care.  This patient requires this care by a professional since this patient will be at risk due to having venous stasis dermatitis  B/L. This patient is accompanied by caregiver.  This patient is unable to cut nails himself since the patient cannot reach his nails.These nails are painful walking and wearing shoes.  He has painful callus both big toes.  This patient presents for at risk foot care today.  General Appearance  Alert, conversant and in no acute stress.  Vascular  Dorsalis pedis and posterior tibial  pulses are palpable  bilaterally.  Capillary return is within normal limits  bilaterally. Temperature is within normal limits  bilaterally.  Neurologic  Senn-Weinstein monofilament wire test within normal limits  bilaterally. Muscle power within normal limits bilaterally.  Nails Thick disfigured discolored nails with subungual debris  from hallux to fifth toes bilaterally. No evidence of bacterial infection or drainage bilaterally.  Orthopedic  No limitations of motion  feet .  No crepitus or effusions noted.  Hallux malleus  B/L.  Hammer toes  B/L.  Skin  normotropic skin with no porokeratosis noted bilaterally.  No signs of infections or ulcers noted.   Pinch callus  B/L  Onychomycosis  Pain in right toes  Pain in left toes  Pinch callus  B/l.   Consent was obtained for treatment procedures.   Mechanical debridement of nails 1-5  bilaterally performed with a nail nipper.  Filed with dremel without incident. Debride callus with # 15 blade.   Return office visit   3 months                  Told patient to return for periodic foot care and evaluation due to potential at risk complications.   Georga Stys DPM  

## 2021-12-16 ENCOUNTER — Other Ambulatory Visit: Payer: Self-pay

## 2021-12-16 DIAGNOSIS — E782 Mixed hyperlipidemia: Secondary | ICD-10-CM

## 2021-12-16 DIAGNOSIS — F3341 Major depressive disorder, recurrent, in partial remission: Secondary | ICD-10-CM

## 2021-12-16 DIAGNOSIS — R351 Nocturia: Secondary | ICD-10-CM

## 2021-12-16 DIAGNOSIS — Z0000 Encounter for general adult medical examination without abnormal findings: Secondary | ICD-10-CM

## 2021-12-16 DIAGNOSIS — R7303 Prediabetes: Secondary | ICD-10-CM

## 2021-12-17 ENCOUNTER — Other Ambulatory Visit: Payer: Medicare Other

## 2021-12-17 DIAGNOSIS — R7303 Prediabetes: Secondary | ICD-10-CM | POA: Diagnosis not present

## 2021-12-17 DIAGNOSIS — E782 Mixed hyperlipidemia: Secondary | ICD-10-CM | POA: Diagnosis not present

## 2021-12-18 LAB — COMPLETE METABOLIC PANEL WITH GFR
AG Ratio: 1.8 (calc) (ref 1.0–2.5)
ALT: 22 U/L (ref 9–46)
AST: 21 U/L (ref 10–35)
Albumin: 4.4 g/dL (ref 3.6–5.1)
Alkaline phosphatase (APISO): 56 U/L (ref 35–144)
BUN: 13 mg/dL (ref 7–25)
CO2: 26 mmol/L (ref 20–32)
Calcium: 9.5 mg/dL (ref 8.6–10.3)
Chloride: 106 mmol/L (ref 98–110)
Creat: 0.88 mg/dL (ref 0.70–1.30)
Globulin: 2.5 g/dL (calc) (ref 1.9–3.7)
Glucose, Bld: 96 mg/dL (ref 65–99)
Potassium: 4.6 mmol/L (ref 3.5–5.3)
Sodium: 142 mmol/L (ref 135–146)
Total Bilirubin: 0.6 mg/dL (ref 0.2–1.2)
Total Protein: 6.9 g/dL (ref 6.1–8.1)
eGFR: 101 mL/min/{1.73_m2} (ref >=60)

## 2021-12-18 LAB — HEMOGLOBIN A1C
Hgb A1c MFr Bld: 5.5 % of total Hgb (ref <5.7)
Mean Plasma Glucose: 111 mg/dL
eAG (mmol/L): 6.2 mmol/L

## 2021-12-18 LAB — CBC WITH DIFFERENTIAL/PLATELET
Absolute Monocytes: 655 cells/uL (ref 200–950)
Basophils Absolute: 43 cells/uL (ref 0–200)
Basophils Relative: 0.6 %
Eosinophils Absolute: 173 cells/uL (ref 15–500)
Eosinophils Relative: 2.4 %
HCT: 48.7 % (ref 38.5–50.0)
Hemoglobin: 15.7 g/dL (ref 13.2–17.1)
Lymphs Abs: 3038 cells/uL (ref 850–3900)
MCH: 28.2 pg (ref 27.0–33.0)
MCHC: 32.2 g/dL (ref 32.0–36.0)
MCV: 87.4 fL (ref 80.0–100.0)
MPV: 10.2 fL (ref 7.5–12.5)
Monocytes Relative: 9.1 %
Neutro Abs: 3290 cells/uL (ref 1500–7800)
Neutrophils Relative %: 45.7 %
Platelets: 260 10*3/uL (ref 140–400)
RBC: 5.57 10*6/uL (ref 4.20–5.80)
RDW: 13.4 % (ref 11.0–15.0)
Total Lymphocyte: 42.2 %
WBC: 7.2 10*3/uL (ref 3.8–10.8)

## 2021-12-18 LAB — LIPID PANEL
Cholesterol: 186 mg/dL (ref <200)
HDL: 42 mg/dL (ref >=40)
LDL Cholesterol (Calc): 116 mg/dL (calc) — ABNORMAL HIGH
Non-HDL Cholesterol (Calc): 144 mg/dL (calc) — ABNORMAL HIGH (ref <130)
Total CHOL/HDL Ratio: 4.4 (calc) (ref <5.0)
Triglycerides: 167 mg/dL — ABNORMAL HIGH (ref <150)

## 2021-12-18 LAB — PSA: PSA: 0.22 ng/mL (ref <=4.00)

## 2021-12-18 LAB — TSH: TSH: 1.34 mIU/L (ref 0.40–4.50)

## 2021-12-24 ENCOUNTER — Other Ambulatory Visit: Payer: Medicare Other

## 2021-12-24 ENCOUNTER — Encounter: Payer: Self-pay | Admitting: Family Medicine

## 2021-12-24 ENCOUNTER — Ambulatory Visit (INDEPENDENT_AMBULATORY_CARE_PROVIDER_SITE_OTHER): Payer: Medicare Other | Admitting: Family Medicine

## 2021-12-24 VITALS — BP 133/78 | HR 84 | Ht 76.0 in | Wt 333.0 lb

## 2021-12-24 DIAGNOSIS — Z0000 Encounter for general adult medical examination without abnormal findings: Secondary | ICD-10-CM

## 2021-12-24 DIAGNOSIS — R7303 Prediabetes: Secondary | ICD-10-CM

## 2021-12-24 DIAGNOSIS — F3341 Major depressive disorder, recurrent, in partial remission: Secondary | ICD-10-CM

## 2021-12-24 DIAGNOSIS — E782 Mixed hyperlipidemia: Secondary | ICD-10-CM

## 2021-12-24 NOTE — Patient Instructions (Addendum)
Thank you for coming to the office today.  Future option Shingles vaccine- Shingrix 2 doses, can do at the pharmacy or here. 2-6 months apart.  Lab results are excellent overall  Cholesterol mild elevated.  Wax on both sides    Please schedule a Follow-up Appointment to: Return in about 1 year (around 12/25/2022) for Return anytime for ear wax flushing. Then 1 year Lab and then Annual Physical.  If you have any other questions or concerns, please feel free to call the office or send a message through [place]. You may also schedule an earlier appointment if necessary.  Additionally, you may be receiving a survey about your experience at our office within a few days to 1 week by e-mail or mail. We value your feedback.  Nobie Putnam, DO [place]

## 2021-12-24 NOTE — Assessment & Plan Note (Signed)
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

## 2021-12-24 NOTE — Progress Notes (Signed)
Subjective:    Patient ID: Mario Silva, male    DOB: [birth year], 56 y.o.   MRN: [id number]  Mario Silva is a 56 y.o. male presenting on 12/24/2021 for Annual Exam   HPI  Living at [facility] several days a week, and then will stay with family on other days.   HPI   Left Knee Pain Worse with inner part of knee. Pain inc with prolonged sedentary seated with knee flexed. Worse with getting up and moving after, some pain in knee inner part radiating into lower leg.   Major Depression recurrent / Anxiety Continues on Abilify 2m daily Has Lorazepam PRN if need   Episodic Vertigo / Inner Ear Problem Episodes of vertigo on occasion, has improved with Meclizine PRN.   GERD Chronic problem has Pantoprazole 459mdaily however he was only taking this 3 times a week. He has upset stomach and digestive problems often. He has taken Gas X PRN for indigestion   HYPERLIPIDEMIA: - Reports no concerns. Last lipid panel 11/2021, mostly normal with LDL mild elevated 116 Not on medication Lifestyle - Diet: Improving - Exercise: Walking regularly  A1c 5.5, not in pre diabetes.   Health Maintenance: UTD reviewed colonoscopy screening, vaccines.  PSA 0.22 negative. 11/2021     12/24/2021    9:05 AM 09/22/2021    8:47 AM 01/13/2021    3:29 PM  Depression screen PHQ 2/9  Decreased Interest 0 0 0  Down, Depressed, Hopeless 0 0 0  PHQ - 2 Score 0 0 0  Altered sleeping 0 0   Tired, decreased energy 0 0   Change in appetite 0 0   Feeling bad or failure about yourself  0 0   Trouble concentrating 0 0   Moving slowly or fidgety/restless 0 0   Suicidal thoughts 0 0   PHQ-9 Score 0 0   Difficult doing work/chores Not difficult at all Not difficult at all     Past Medical History:  Diagnosis Date   Acid reflux    Actinic keratosis 08/30/2019   right eyebrow, bx proven   Anxiety    Depression    Mental retardation    Restless leg syndrome    Sleep apnea    Past Surgical History:   Procedure Laterality Date   COLONOSCOPY     COLONOSCOPY WITH PROPOFOL N/A 02/08/2018   Procedure: COLONOSCOPY WITH PROPOFOL;  Surgeon: ByRobert BellowMD;  Location: [facility] ENDOSCOPY;  Service: General;  Laterality: N/A;   diviated septum     NOSE SURGERY     Social History   Socioeconomic History   Marital status: Single    Spouse name: Not on file   Number of children: Not on file   Years of education: Not on file   Highest education level: Not on file  Occupational History   Not on file  Tobacco Use   Smoking status: Never   Smokeless tobacco: Never  Vaping Use   Vaping Use: Never used  Substance and Sexual Activity   Alcohol use: No   Drug use: No   Sexual activity: Not on file  Other Topics Concern   Not on file  Social History Narrative   ** Merged History Encounter **       Social Determinants of Health   Financial Resource Strain: Low Risk  (01/13/2021)   Overall Financial Resource Strain (CARDIA)    Difficulty of Paying Living Expenses: Not hard at all  Food Insecurity: No  Food Insecurity (01/13/2021)   Hunger Vital Sign    Worried About Running Out of Food in the Last Year: Never true    Ran Out of Food in the Last Year: Never true  Transportation Needs: No Transportation Needs (01/13/2021)   PRAPARE - Hydrologist (Medical): No    Lack of Transportation (Non-Medical): No  Physical Activity: Sufficiently Active (01/13/2021)   Exercise Vital Sign    Days of Exercise per Week: 5 days    Minutes of Exercise per Session: 30 min  Stress: No Stress Concern Present (01/13/2021)   [place]    Feeling of Stress : Not at all  Social Connections: Not on file  Intimate Partner Violence: Not on file   Family History  Problem Relation Age of Onset   Hypertension Mother    Hyperlipidemia Mother    Anxiety disorder Mother    Thyroid disease Mother    Current Outpatient  Medications on File Prior to Visit  Medication Sig   ARIPiprazole (ABILIFY) 5 MG tablet Take 1 tablet daily   azelastine (ASTELIN) 0.1 % nasal spray Place into both nostrils.   b complex vitamins tablet Take 1 tablet by mouth daily.   calcipotriene (DOVONOX) 0.005 % cream Apply topically 2 (two) times daily.   Calcium Polycarbophil (FIBER-CAPS PO) Take by mouth.   Cholecalciferol (VITAMIN D3 PO) Take by mouth.   ciclopirox (LOPROX) 0.77 % cream For fungal infection - apply to feet BID until scale has resolved. Then, continue BID for one more week after that. Afterwards, continue QW to prevent recurrence.   diclofenac Sodium (VOLTAREN) 1 % GEL Apply 2 g topically 4 (four) times daily as needed (knee pain).   LORazepam (ATIVAN) 0.5 MG tablet Take 0.5-1 tablets (0.25-0.5 mg total) by mouth daily as needed for anxiety.   magnesium oxide (MAG-OX) 400 MG tablet Take 400 mg by mouth daily.   meclizine (ANTIVERT) 25 MG tablet Take 1 tablet (25 mg total) by mouth 3 (three) times daily as needed for dizziness.   mupirocin ointment (BACTROBAN) 2 % Apply 1 application topically daily. With dressing changes   pantoprazole (PROTONIX) 40 MG tablet Take 1 tablet (40 mg total) by mouth daily before breakfast. For stomach acid   Probiotic Product (PROBIOTIC DAILY PO) Take by mouth.   triamcinolone ointment (KENALOG) 0.1 % Apply 1 application topically 2 (two) times daily as needed. For up to 2 weeks.Avoid applying to face, groin, and axilla. Use as directed. Long-term use can cause thinning of the skin.   No current facility-administered medications on file prior to visit.    Review of Systems  Constitutional:  Negative for activity change, appetite change, chills, diaphoresis, fatigue and fever.  HENT:  Negative for congestion and hearing loss.   Eyes:  Negative for visual disturbance.  Respiratory:  Negative for cough, chest tightness, shortness of breath and wheezing.   Cardiovascular:  Negative for chest  pain, palpitations and leg swelling.  Gastrointestinal:  Negative for abdominal pain, constipation, diarrhea, nausea and vomiting.  Genitourinary:  Negative for dysuria, frequency and hematuria.  Musculoskeletal:  Negative for arthralgias and neck pain.  Skin:  Negative for rash.  Neurological:  Negative for dizziness, weakness, light-headedness, numbness and headaches.  Hematological:  Negative for adenopathy.  Psychiatric/Behavioral:  Negative for behavioral problems, dysphoric mood and sleep disturbance.    Per HPI unless specifically indicated above     Objective:  BP 133/78   Pulse 84   Ht '6\' 4"'  (1.93 m)   Wt (!) 333 lb (151 kg)   SpO2 95%   BMI 40.53 kg/m   Wt Readings from Last 3 Encounters:  12/24/21 (!) 333 lb (151 kg)  09/22/21 (!) 328 lb 6.4 oz (149 kg)  04/27/21 (!) 323 lb 9.6 oz (146.8 kg)    Physical Exam Vitals and nursing note reviewed.  Constitutional:      General: He is not in acute distress.    Appearance: He is well-developed. He is not diaphoretic.     Comments: Well-appearing, comfortable, cooperative  HENT:     Head: Normocephalic and atraumatic.     Right Ear: There is impacted cerumen.     Left Ear: There is impacted cerumen.  Eyes:     General:        Right eye: No discharge.        Left eye: No discharge.     Conjunctiva/sclera: Conjunctivae normal.     Pupils: Pupils are equal, round, and reactive to light.  Neck:     Thyroid: No thyromegaly.  Cardiovascular:     Rate and Rhythm: Normal rate and regular rhythm.     Pulses: Normal pulses.     Heart sounds: Normal heart sounds. No murmur heard. Pulmonary:     Effort: Pulmonary effort is normal. No respiratory distress.     Breath sounds: Normal breath sounds. No wheezing or rales.  Abdominal:     General: Bowel sounds are normal. There is no distension.     Palpations: Abdomen is soft. There is no mass.     Tenderness: There is no abdominal tenderness.  Musculoskeletal:         General: No tenderness. Normal range of motion.     Cervical back: Normal range of motion and neck supple.     Comments: Upper / Lower Extremities: - Normal muscle tone, strength bilateral upper extremities 5/5, lower extremities 5/5  Lymphadenopathy:     Cervical: No cervical adenopathy.  Skin:    General: Skin is warm and dry.     Findings: No erythema or rash.  Neurological:     Mental Status: He is alert and oriented to person, place, and time.     Comments: Distal sensation intact to light touch all extremities  Psychiatric:        Mood and Affect: Mood normal.        Behavior: Behavior normal.        Thought Content: Thought content normal.     Comments: Well groomed, good eye contact, normal speech and thoughts    Results for orders placed or performed in visit on 12/16/21  TSH  Result Value Ref Range   TSH 1.34 0.40 - 4.50 mIU/L  PSA  Result Value Ref Range   PSA 0.22 < OR = 4.00 ng/mL  Hemoglobin A1c  Result Value Ref Range   Hgb A1c MFr Bld 5.5 <5.7 % of total Hgb   Mean Plasma Glucose 111 mg/dL   eAG (mmol/L) 6.2 mmol/L  Lipid panel  Result Value Ref Range   Cholesterol 186 <200 mg/dL   HDL 42 > OR = 40 mg/dL   Triglycerides 167 (H) <150 mg/dL   LDL Cholesterol (Calc) 116 (H) mg/dL (calc)   Total CHOL/HDL Ratio 4.4 <5.0 (calc)   Non-HDL Cholesterol (Calc) 144 (H) <130 mg/dL (calc)  CBC with Differential/Platelet  Result Value Ref Range   WBC 7.2 3.8 -  10.8 Thousand/uL   RBC 5.57 4.20 - 5.80 Million/uL   Hemoglobin 15.7 13.2 - 17.1 g/dL   HCT 48.7 38.5 - 50.0 %   MCV 87.4 80.0 - 100.0 fL   MCH 28.2 27.0 - 33.0 pg   MCHC 32.2 32.0 - 36.0 g/dL   RDW 13.4 11.0 - 15.0 %   Platelets 260 140 - 400 Thousand/uL   MPV 10.2 7.5 - 12.5 fL   Neutro Abs 3,290 1,500 - 7,800 cells/uL   Lymphs Abs 3,038 850 - 3,900 cells/uL   Absolute Monocytes 655 200 - 950 cells/uL   Eosinophils Absolute 173 15 - 500 cells/uL   Basophils Absolute 43 0 - 200 cells/uL   Neutrophils  Relative % 45.7 %   Total Lymphocyte 42.2 %   Monocytes Relative 9.1 %   Eosinophils Relative 2.4 %   Basophils Relative 0.6 %  COMPLETE METABOLIC PANEL WITH GFR  Result Value Ref Range   Glucose, Bld 96 65 - 99 mg/dL   BUN 13 7 - 25 mg/dL   Creat 0.88 0.70 - 1.30 mg/dL   eGFR 101 > OR = 60 mL/min/1.35m   BUN/Creatinine Ratio NOT APPLICABLE 6 - 22 (calc)   Sodium 142 135 - 146 mmol/L   Potassium 4.6 3.5 - 5.3 mmol/L   Chloride 106 98 - 110 mmol/L   CO2 26 20 - 32 mmol/L   Calcium 9.5 8.6 - 10.3 mg/dL   Total Protein 6.9 6.1 - 8.1 g/dL   Albumin 4.4 3.6 - 5.1 g/dL   Globulin 2.5 1.9 - 3.7 g/dL (calc)   AG Ratio 1.8 1.0 - 2.5 (calc)   Total Bilirubin 0.6 0.2 - 1.2 mg/dL   Alkaline phosphatase (APISO) 56 35 - 144 U/L   AST 21 10 - 35 U/L   ALT 22 9 - 46 U/L   Recent Labs    12/17/21 0757  HGBA1C 5.5        Assessment & Plan:   Problem List Items Addressed This Visit     Recurrent major depressive disorder, in partial remission ([place]    Depression in partial remission Continues on Abilify 537mdaily On Lorazepam PRN      Prediabetes    Well-controlled Pre-DM with A1c 5.5  Plan:  1. Not on any therapy currently  2. Encourage improved lifestyle - low carb, low sugar diet, reduce portion size, continue improving regular exercise      Morbid obesity (HCC)   Mixed hyperlipidemia    LDL 116 The 10-year ASCVD risk score (Arnett DK, et al., 2019) is: 7% Not indicated for statin therapy      Other Visit Diagnoses     Annual physical exam    -  Primary       Updated Health Maintenance information Reviewed recent lab results with patient Encouraged improvement to lifestyle with diet and exercise Goal of weight loss  Completed Form today for documentation of visit.  No orders of the defined types were placed in this encounter.     Follow up plan: Return in about 1 year (around 12/25/2022) for Return anytime for ear wax flushing. Then 1 year Lab and then  Annual Physical.  AlNobie PutnamDO [place] 12/24/2021, 9:07 AM

## 2021-12-24 NOTE — Assessment & Plan Note (Signed)
Well-controlled Pre-DM with A1c 5.5  Plan:  1. Not on any therapy currently  2. Encourage improved lifestyle - low carb, low sugar diet, reduce portion size, continue improving regular exercise

## 2021-12-24 NOTE — Assessment & Plan Note (Signed)
LDL 116 The 10-year ASCVD risk score (Arnett DK, et al., 2019) is: 7% Not indicated for statin therapy

## 2022-01-05 ENCOUNTER — Ambulatory Visit: Payer: Medicare Other

## 2022-01-08 ENCOUNTER — Ambulatory Visit: Payer: Medicare Other | Admitting: Family Medicine

## 2022-01-15 DIAGNOSIS — G4733 Obstructive sleep apnea (adult) (pediatric): Secondary | ICD-10-CM | POA: Diagnosis not present

## 2022-01-27 ENCOUNTER — Ambulatory Visit (INDEPENDENT_AMBULATORY_CARE_PROVIDER_SITE_OTHER): Payer: Medicare Other | Admitting: Dermatology

## 2022-01-27 DIAGNOSIS — D1800 Hemangioma unspecified site: Secondary | ICD-10-CM

## 2022-01-27 DIAGNOSIS — Z1283 Encounter for screening for malignant neoplasm of skin: Secondary | ICD-10-CM | POA: Diagnosis not present

## 2022-01-27 DIAGNOSIS — L814 Other melanin hyperpigmentation: Secondary | ICD-10-CM

## 2022-01-27 DIAGNOSIS — L578 Other skin changes due to chronic exposure to nonionizing radiation: Secondary | ICD-10-CM

## 2022-01-27 DIAGNOSIS — L821 Other seborrheic keratosis: Secondary | ICD-10-CM | POA: Diagnosis not present

## 2022-01-27 DIAGNOSIS — D229 Melanocytic nevi, unspecified: Secondary | ICD-10-CM | POA: Diagnosis not present

## 2022-01-27 DIAGNOSIS — L905 Scar conditions and fibrosis of skin: Secondary | ICD-10-CM | POA: Diagnosis not present

## 2022-01-27 DIAGNOSIS — B079 Viral wart, unspecified: Secondary | ICD-10-CM

## 2022-01-27 DIAGNOSIS — B353 Tinea pedis: Secondary | ICD-10-CM | POA: Diagnosis not present

## 2022-01-27 MED ORDER — CICLOPIROX OLAMINE 0.77 % EX CREA: TOPICAL_CREAM | CUTANEOUS | 11 refills | Status: DC

## 2022-01-27 NOTE — Patient Instructions (Addendum)
Open wound at right toe  Apply  Vaseline and band aid until healed.       Melanoma ABCDEs  Melanoma is the most dangerous type of skin cancer, and is the leading cause of death from skin disease.  You are more likely to develop melanoma if you: Have light-colored skin, light-colored eyes, or red or blond hair Spend a lot of time in the sun Tan regularly, either outdoors or in a tanning bed Have had blistering sunburns, especially during childhood Have a close family member who has had a melanoma Have atypical moles or large birthmarks  Early detection of melanoma is key since treatment is typically straightforward and cure rates are extremely high if we catch it early.   The first sign of melanoma is often a change in a mole or a new dark spot.  The ABCDE system is a way of remembering the signs of melanoma.  A for asymmetry:  The two halves do not match. B for border:  The edges of the growth are irregular. C for color:  A mixture of colors are present instead of an even brown color. D for diameter:  Melanomas are usually (but not always) greater than 84m - the size of a pencil eraser. E for evolution:  The spot keeps changing in size, shape, and color.  Please check your skin once per month between visits. You can use a small mirror in front and a large mirror behind you to keep an eye on the back side or your body.   If you see any new or changing lesions before your next follow-up, please call to schedule a visit.  Please continue daily skin protection including broad spectrum sunscreen SPF 30+ to sun-exposed areas, reapplying every 2 hours as needed when you're outdoors.   Staying in the shade or wearing long sleeves, sun glasses (UVA+UVB protection) and wide brim hats (4-inch brim around the entire circumference of the hat) are also recommended for sun protection.    Due to recent changes in healthcare laws, you may see results of your pathology and/or laboratory studies on  MyChart before the doctors have had a chance to review them. We understand that in some cases there may be results that are confusing or concerning to you. Please understand that not all results are received at the same time and often the doctors may need to interpret multiple results in order to provide you with the best plan of care or course of treatment. Therefore, we ask that you please give u[place]2 business days to thoroughly review all your results before contacting the office for clarification. Should we see a critical lab result, you will be contacted sooner.   If You Need Anything After Your Visit  If you have any questions or concerns for your doctor, please call our main line at [phone] press option 4 to reach your doctor's medical assistant. If no one answers, please leave a voicemail as directed and we will return your call as soon as possible. Messages left after 4 pm will be answered the following business day.   You may also send [place] message via [place] We typically respond to MyChart messages within 1-2 business days.  For prescription refills, please ask your pharmacy to contact our office. Our fax number is [phone]  If you have an urgent issue when the clinic is closed that cannot wait until the next business day, you can page your doctor at the number below.  Please note that while we do our best to be available for urgent issues outside of office hours, we are not available 24/7.   If you have an urgent issue and are unable to reach [place], you may choose to seek medical care at your doctor's office, retail clinic, urgent care center, or emergency room.  If you have a medical emergency, please immediately call 911 or go to the emergency department.  Pager Numbers  - Dr. Nehemiah Massed: [phone]  - Dr. Laurence Ferrari: [phone]  - Dr. Nicole Kindred: [phone]  In the event of inclement weather, please call our main line at [phone] for an update on the status of  any delays or closures.  Dermatology Medication Tips: Please keep the boxes that topical medications come in in order to help keep track of the instructions about where and how to use these. Pharmacies typically print the medication instructions only on the boxes and not directly on the medication tubes.   If your medication is too expensive, please contact our office at [phone] option 4 or send [place] a message through [place].   We are unable to tell what your co-pay for medications will be in advance as this is different depending on your insurance coverage. However, we may be able to find a substitute medication at lower cost or fill out paperwork to get insurance to cover a needed medication.   If a prior authorization is required to get your medication covered by your insurance company, please allow [place] 1-2 business days to complete this process.  Drug prices often vary depending on where the prescription is filled and some pharmacies may offer cheaper prices.  The website www.goodrx.com contains coupons for medications through different pharmacies. The prices here do not account for what the cost may be with help from insurance (it may be cheaper with your insurance), but the website can give you the price if you did not use any insurance.  - You can print the associated coupon and take it with your prescription to the pharmacy.  - You may also stop by our office during regular business hours and pick up a GoodRx coupon card.  - If you need your prescription sent electronically to a different pharmacy, notify our office through [facility] or by phone at [phone] option 4.     Si Usted Necesita Algo Despus de Su Visita  Tambin puede enviarnos un mensaje a travs de Pharmacist, community. Por lo general respondemos a los mensajes de MyChart en el transcurso de 1 a 2 das hbiles.  Para renovar recetas, por favor pida a su farmacia que se ponga en contacto con nuestra oficina. Harland Dingwall de fax es [place] [phone].  Si tiene un asunto urgente cuando la clnica est cerrada y que no puede esperar hasta el siguiente da hbil, puede llamar/localizar a su doctor(a) al nmero que aparece a continuacin.   Por favor, tenga en cuenta que aunque hacemos todo lo posible para estar disponibles para asuntos urgentes fuera del horario de [place], no estamos disponibles las 24 horas del da, los 7 das de la [place].   Si tiene un problema urgente y no puede comunicarse con nosotros, puede optar por buscar atencin mdica  en el consultorio de su doctor(a), en una clnica privada, en un centro de atencin urgente o en una sala de emergencias.  Si tiene Engineering geologist, por favor llame inmediatamente al 911 o vaya a la sala de emergencias.  Nmeros de bper  - Dr. Nehemiah Massed:  [phone]  - Dra. Moye: [phone]  - Dra. Nicole Kindred: [phone]  En caso de inclemencias del [place], por favor llame a Johnsie Kindred principal al [phone] para una actualizacin sobre el [place] de cualquier retraso o cierre.  Consejos para la medicacin en dermatologa: Por favor, guarde las cajas en las que vienen los medicamentos de uso tpico para ayudarle a seguir las instrucciones sobre dnde y cmo usarlos. Las farmacias generalmente imprimen las instrucciones del medicamento slo en las cajas y no directamente en los tubos del [place].   Si su medicamento es muy caro, por favor, pngase en contacto con Zigmund Daniel llamando al [phone] y presione la opcin 4 o envenos un mensaje a travs de Pharmacist, community.   No podemos decirle cul ser su copago por los medicamentos por adelantado ya que esto es diferente dependiendo de la cobertura de su seguro. Sin embargo, es posible que podamos encontrar un medicamento sustituto a Electrical engineer un formulario para que el seguro cubra el medicamento que se considera necesario.   Si se requiere una autorizacin previa para que su compaa de  seguros [place] su medicamento, por favor permtanos de 1 a 2 das hbiles para completar este proceso.  Los precios de los medicamentos varan con frecuencia dependiendo del Environmental consultant de dnde se surte la receta y alguna farmacias pueden ofrecer precios ms baratos.  El sitio web www.goodrx.com tiene cupones para medicamentos de Airline pilot. Los precios aqu no tienen en cuenta lo que podra costar con la ayuda del seguro (puede ser ms barato con su seguro), pero el sitio web puede darle el precio si no utiliz Research scientist (physical sciences).  - Puede imprimir el cupn correspondiente y llevarlo con su receta a la farmacia.  - Tambin puede pasar por nuestra oficina durante el horario de atencin regular y Charity fundraiser una tarjeta de cupones de GoodRx.  - Si necesita que su receta se enve electrnicamente a una farmacia diferente, informe a nuestra oficina a travs de MyChart de [place] o por telfono llamando al [phone] y presione la opcin 4.

## 2022-01-27 NOTE — Progress Notes (Unsigned)
Follow-Up Visit   Subjective  Mario Silva is a 56 y.o. male who presents for the following: Annual Exam (1 year tbse, hx of warts, hx of aks, hx of irritant dermatitis. ). No hx of skin cancer.   The patient presents for Total-Body Skin Exam (TBSE) for skin cancer screening and mole check.  The patient has spots, moles and lesions to be evaluated, some may be new or changing and the patient has concerns that these could be cancer. Right toe open wound vaseline  The following portions of the chart were reviewed this encounter and updated as appropriate:  Tobacco  Allergies  Meds  Problems  Med Hx  Surg Hx  Fam Hx      Review of Systems: No other skin or systemic complaints except as noted in HPI or Assessment and Plan.   Objective  Well appearing patient in no apparent distress; mood and affect are within normal limits.  A full examination was performed including scalp, head, eyes, ears, nose, lips, neck, chest, axillae, abdomen, back, buttocks, bilateral upper extremities, bilateral lower extremities, hands, feet, fingers, toes, fingernails, and toenails. All findings within normal limits unless otherwise noted below.  b/l feet and toenails Scaling and maceration web spaces and over distal and lateral soles.   Right Eyebrow Scaly white patch   Assessment & Plan  Tinea pedis of both feet b/l feet and toenails  With skin breakdown between toes (increasing risk of cellulitis) and with onychomycosis   Chronic and persistent condition with duration or expected duration over one year. Condition is bothersome. Currently flared.  Restart Ciclopirox BID until no longer scaly. Then continue BID for one more week.  Afterwards use weekly to prevent recurrence.     ciclopirox (LOPROX) 0.77 % cream - b/l feet and toenails For fungal infection - apply to feet BID until scale has resolved. Then, continue BID for one more week after that. Afterwards, continue weekly  Scar  conditions and fibrosis of skin Right Eyebrow  Exam c/w scar. Previously treated wart with AK  If anything grows in this area, call for recheck  Lentigines - Scattered tan macules - Due to sun exposure - Benign-appearing, observe - Recommend daily broad spectrum sunscreen SPF 30+ to sun-exposed areas, reapply every 2 hours as needed. - Call for any changes  Seborrheic Keratoses - Stuck-on, waxy, tan-brown papules and/or plaques  - Benign-appearing - Discussed benign etiology and prognosis. - Observe - Call for any changes  Melanocytic Nevi - Tan-brown and/or pink-flesh-colored symmetric macules and papules - Benign appearing on exam today - Observation - Call clinic for new or changing moles - Recommend daily use of broad spectrum spf 30+ sunscreen to sun-exposed areas.   Hemangiomas - Red papules - Discussed benign nature - Observe - Call for any changes  Actinic Damage - Chronic condition, secondary to cumulative UV/sun exposure - diffuse scaly erythematous macules with underlying dyspigmentation - Recommend daily broad spectrum sunscreen SPF 30+ to sun-exposed areas, reapply every 2 hours as needed.  - Staying in the shade or wearing long sleeves, sun glasses (UVA+UVB protection) and wide brim hats (4-inch brim around the entire circumference of the hat) are also recommended for sun protection.  - Call for new or changing lesions.  Skin cancer screening performed today. Return in about 1 year (around 01/28/2023) for TBSE. I, Ruthell Rummage, CMA, am acting as scribe for Forest Gleason, MD.  Documentation: I have reviewed the above documentation for accuracy and completeness, and I  agree with the above.  Forest Gleason, MD

## 2022-01-28 ENCOUNTER — Encounter: Payer: Self-pay | Admitting: Dermatology

## 2022-02-22 ENCOUNTER — Encounter: Payer: Self-pay | Admitting: Podiatry

## 2022-02-22 ENCOUNTER — Ambulatory Visit (INDEPENDENT_AMBULATORY_CARE_PROVIDER_SITE_OTHER): Payer: Medicare Other | Admitting: Podiatry

## 2022-02-22 DIAGNOSIS — M79675 Pain in left toe(s): Secondary | ICD-10-CM | POA: Diagnosis not present

## 2022-02-22 DIAGNOSIS — B351 Tinea unguium: Secondary | ICD-10-CM

## 2022-02-22 DIAGNOSIS — L84 Corns and callosities: Secondary | ICD-10-CM | POA: Diagnosis not present

## 2022-02-22 DIAGNOSIS — I872 Venous insufficiency (chronic) (peripheral): Secondary | ICD-10-CM | POA: Diagnosis not present

## 2022-02-22 DIAGNOSIS — M79674 Pain in right toe(s): Secondary | ICD-10-CM

## 2022-02-22 NOTE — Progress Notes (Signed)
This patient returns to my office for at risk foot care.  This patient requires this care by a professional since this patient will be at risk due to having venous stasis dermatitis  B/L. This patient is accompanied by caregiver.  This patient is unable to cut nails himself since the patient cannot reach his nails.These nails are painful walking and wearing shoes.  He has painful callus both big toes.  This patient presents for at risk foot care today.  General Appearance  Alert, conversant and in no acute stress.  Vascular  Dorsalis pedis and posterior tibial  pulses are palpable  bilaterally.  Capillary return is within normal limits  bilaterally. Temperature is within normal limits  bilaterally.  Neurologic  Senn-Weinstein monofilament wire test within normal limits  bilaterally. Muscle power within normal limits bilaterally.  Nails Thick disfigured discolored nails with subungual debris  from hallux to fifth toes bilaterally. No evidence of bacterial infection or drainage bilaterally.  Orthopedic  No limitations of motion  feet .  No crepitus or effusions noted.  Hallux malleus  B/L.  Hammer toes  B/L.  Skin  normotropic skin with no porokeratosis noted bilaterally.  No signs of infections or ulcers noted.   Pinch callus  B/L  Onychomycosis  Pain in right toes  Pain in left toes  Pinch callus  B/l.   Consent was obtained for treatment procedures.   Mechanical debridement of nails 1-5  bilaterally performed with a nail nipper.  Filed with dremel without incident. Debride callus with # 15 blade.   Return office visit   3 months                  Told patient to return for periodic foot care and evaluation due to potential at risk complications.   Miklos Bidinger DPM  

## 2022-03-18 ENCOUNTER — Ambulatory Visit: Payer: Self-pay

## 2022-03-18 NOTE — Telephone Encounter (Signed)
Chief Complaint: [place] and scraped knee Symptoms: Redness, swelling under right knee with scab Frequency: Onset last Friday Pertinent Negatives: Patient denies fever, other symptoms Disposition: '[]'$ ED /'[]'$ Urgent Care (no appt availability in office) / '[x]'$ Appointment(In office/virtual)/ '[]'$  [facility]/ '[]'$ Home Care/ '[]'$ Refused Recommended Disposition /'[]'$ [place] [facility]/ '[]'$  Follow-up with PCP Additional Notes: Spoke to patient's sister on DPR, Colon Flattery.    Summary: fell scraped knee   Patient's sister called in, states patient fell Friday and scraped knee, has scratch right below knee,no appt avalable until 09/27, pt has another appt 09/25.      Reason for Disposition  [1] After 3 days AND [2] pain not improved  Answer Assessment - Initial Assessment Questions 1. MECHANISM: "How did the injury happen?" (e.g., twisting injury, direct blow)      Fell and scraped below the right knee 2. ONSET: "When did the injury happen?" (Minutes or hours ago)      Friday 3. LOCATION: "Where is the injury located?"      Under the right knee 4. APPEARANCE of INJURY: "What does the injury look like?"      Redness, tenderness, a little swelling 5. SEVERITY: "Can you put weight on that leg?" "Can you walk?"      Yes 6. SIZE: For cuts, bruises, or swelling, ask: "How large is it?" (e.g., inches or centimeters;  entire joint)      Scab, 1.5 inches long 7. PAIN: "Is there pain?" If Yes, ask: "How bad is the pain?"  "What does it keep you from doing?" (e.g., Scale 1-10; or mild, moderate, severe)   -  NONE: (0): no pain   -  MILD (1-3): doesn't interfere with normal activities    -  MODERATE (4-7): interferes with normal activities (e.g., work or school) or awakens from sleep, limping    -  SEVERE (8-10): excruciating pain, unable to do any normal activities, unable to walk     Uncomfortable when walking 8. TETANUS: For any breaks in the skin, ask: "When was the last tetanus booster?"      N/A 9. OTHER SYMPTOMS: "Do you have any other symptoms?"  (e.g., "pop" when knee injured, swelling, locking, buckling)      Redness to area, swelling 10. PREGNANCY: "Is there any chance you are pregnant?" "When was your last menstrual period?"       N/A  Protocols used: Knee Injury-A-AH

## 2022-03-19 ENCOUNTER — Ambulatory Visit (INDEPENDENT_AMBULATORY_CARE_PROVIDER_SITE_OTHER): Payer: Medicare Other | Admitting: Internal Medicine

## 2022-03-19 ENCOUNTER — Encounter: Payer: Self-pay | Admitting: Internal Medicine

## 2022-03-19 VITALS — BP 124/82 | HR 89 | Temp 96.9°F | Wt 341.0 lb

## 2022-03-19 DIAGNOSIS — Z23 Encounter for immunization: Secondary | ICD-10-CM | POA: Diagnosis not present

## 2022-03-19 DIAGNOSIS — H6123 Impacted cerumen, bilateral: Secondary | ICD-10-CM | POA: Diagnosis not present

## 2022-03-19 DIAGNOSIS — S80211A Abrasion, right knee, initial encounter: Secondary | ICD-10-CM | POA: Diagnosis not present

## 2022-03-19 DIAGNOSIS — L089 Local infection of the skin and subcutaneous tissue, unspecified: Secondary | ICD-10-CM | POA: Diagnosis not present

## 2022-03-19 MED ORDER — CEPHALEXIN 500 MG PO CAPS: 500.0000 mg | ORAL_CAPSULE | Freq: Three times a day (TID) | ORAL | 0 refills | Status: DC

## 2022-03-19 NOTE — Patient Instructions (Signed)
Abrasion An abrasion is a cut or a scrape on your skin. You must take care of your wound so germs do not get in it and cause infection. What are the causes? This condition is caused by rubbing your skin on something or falling on a surface, such as the ground. When your skin rubs on something, some layers of skin may rub off. What are the signs or symptoms? A cut or a scrape. Bleeding. A red or pink spot. A bruise under your wound. How is this treated? This condition may be treated with: Cleaning your wound. Putting ointment on your wound. Putting a bandage on your wound. Getting a tetanus shot. Follow these instructions at home: Your doctor may tell you to do these things: Medicines Take or use over-the-counter and prescription medicines only as told by your doctor. If you were prescribed an antibiotic medicine, use it as told by your doctor. Do not stop using it even if you start to feel better. Keep your wound clean Clean your wound 1 or 2 times a day or as often told by your doctor. To do this: Wash your hands for at least 20 seconds with mild soap and water. Do this before and after you clean your wound. Wash your wound with mild soap and water. Rinse off the soap. Pat your wound with a clean towel to dry it. Do not rub your wound. Keep your bandage clean and dry. Take it off and change it as told by your doctor. You may have to change your bandage one or more times a day, or as told by your doctor. Watch for signs of infection Check your wound every day for signs of infection. Check for: A red streak that goes away from your wound. Other redness. Swelling or more pain. Warmth. Blood, fluid, pus, or a bad smell. Treat pain and swelling  If told, put ice on the injured area. To do this: Put ice in a plastic bag. Place a towel between your skin and the bag. Leave the ice on for 20 minutes, 2-3 times a day. Take off the ice if your skin turns bright red. This is very  important. If you cannot feel pain, heat, or cold, you have a greater risk of damage to the area. If you can, raise the injured area above the level of your heart while you are sitting or lying down. General instructions Do not take baths, swim, or use a hot tub. Ask your doctor about taking showers or sponge baths. Keep all follow-up visits. Contact a doctor if: You had a tetanus shot, and you have any of these where the needle went in: Swelling. Very bad pain. Redness. Bleeding. You have a lot of pain, and medicine does not help. You have a fever. You have any of these signs of infection in your wound: Redness, swelling, or more pain. Blood, fluid, pus, or a bad smell. Warmth. Get help right away if: You have a red streak going away from your wound. Summary An abrasion is a cut or a scrape on your skin. Take care of your wound so germs do not get in it. Clean your wound 1 or 2 times a day or as often as told. Change your bandage as told and use medicines as told. Call your doctor if you have a fever or if you have redness, swelling, or more pain in your wound. Call your doctor if you have blood, fluid, pus, or a bad smell in your wound.  Get help right away if you have a red streak going away from your wound. This information is not intended to replace advice given to you by your health care provider. Make sure you discuss any questions you have with your health care provider. Document Revised: 09/13/2019 Document Reviewed: 09/13/2019 Elsevier Patient Education  [place].

## 2022-03-19 NOTE — Progress Notes (Signed)
Subjective:    Patient ID: Mario Silva, male    DOB: October 06, 1965, 56 y.o.   MRN: [id number]  HPI  Pt presents to the clinic today with c/o abrasion to knee. This occurred after a fall on 9/14. He has been cleaning it with soap and water.  He started to notice increased redness and swelling 3 days ago.  He has been applying Neosporin OTC with minimal relief of symptoms.  He would also like his ears flushed today.  He has a history of cerumen impaction and gets this done routinely.  Review of Systems   Past Medical History:  Diagnosis Date   Acid reflux    Actinic keratosis 08/30/2019   right eyebrow, bx proven   Anxiety    Depression    Mental retardation    Restless leg syndrome    Sleep apnea     Current Outpatient Medications  Medication Sig Dispense Refill   ARIPiprazole (ABILIFY) 5 MG tablet Take 1 tablet daily 90 tablet 3   azelastine (ASTELIN) 0.1 % nasal spray Place into both nostrils.     b complex vitamins tablet Take 1 tablet by mouth daily.     calcipotriene (DOVONOX) 0.005 % cream Apply topically 2 (two) times daily. 60 g 0   Calcium Polycarbophil (FIBER-CAPS PO) Take by mouth.     Cholecalciferol (VITAMIN D3 PO) Take by mouth.     ciclopirox (LOPROX) 0.77 % cream For fungal infection - apply to feet BID until scale has resolved. Then, continue BID for one more week after that. Afterwards, continue weekly 90 g 11   diclofenac Sodium (VOLTAREN) 1 % GEL Apply 2 g topically 4 (four) times daily as needed (knee pain). 100 g 2   LORazepam (ATIVAN) 0.5 MG tablet Take 0.5-1 tablets (0.25-0.5 mg total) by mouth daily as needed for anxiety. 30 tablet 1   magnesium oxide (MAG-OX) 400 MG tablet Take 400 mg by mouth daily.     meclizine (ANTIVERT) 25 MG tablet Take 1 tablet (25 mg total) by mouth 3 (three) times daily as needed for dizziness. 90 tablet 1   mupirocin ointment (BACTROBAN) 2 % Apply 1 application topically daily. With dressing changes 22 g 0   pantoprazole  (PROTONIX) 40 MG tablet Take 1 tablet (40 mg total) by mouth daily before breakfast. For stomach acid 90 tablet 3   Probiotic Product (PROBIOTIC DAILY PO) Take by mouth.     triamcinolone ointment (KENALOG) 0.1 % Apply 1 application topically 2 (two) times daily as needed. For up to 2 weeks.Avoid applying to face, groin, and axilla. Use as directed. Long-term use can cause thinning of the skin. 80 g 0   No current facility-administered medications for this visit.    Allergies  Allergen Reactions   Penicillins    Latex Other (See Comments) and Rash    blister   Latex Rash    Family History  Problem Relation Age of Onset   Hypertension Mother    Hyperlipidemia Mother    Anxiety disorder Mother    Thyroid disease Mother     Social History   Socioeconomic History   Marital status: Single    Spouse name: Not on file   Number of children: Not on file   Years of education: Not on file   Highest education level: Not on file  Occupational History   Not on file  Tobacco Use   Smoking status: Never   Smokeless tobacco: Never  Vaping Use  Vaping Use: Never used  Substance and Sexual Activity   Alcohol use: No   Drug use: No   Sexual activity: Not on file  Other Topics Concern   Not on file  Social History Narrative   ** Merged History Encounter **       Social Determinants of Health   Financial Resource Strain: Low Risk  (01/13/2021)   Overall Financial Resource Strain (CARDIA)    Difficulty of Paying Living Expenses: Not hard at all  Food Insecurity: No Food Insecurity (01/13/2021)   Hunger Vital Sign    Worried About Running Out of Food in the Last Year: Never true    Ran Out of Food in the Last Year: Never true  Transportation Needs: No Transportation Needs (01/13/2021)   PRAPARE - Hydrologist (Medical): No    Lack of Transportation (Non-Medical): No  Physical Activity: Sufficiently Active (01/13/2021)   Exercise Vital Sign    Days of  Exercise per Week: 5 days    Minutes of Exercise per Session: 30 min  Stress: No Stress Concern Present (01/13/2021)   [place]    Feeling of Stress : Not at all  Social Connections: Not on file  Intimate Partner Violence: Not on file     Constitutional: Denies fever, malaise, fatigue, headache or abrupt weight changes.  HEENT: Patient reports ear wax buildup.  Denies eye pain, eye redness, ear pain, ringing in the ears, runny nose, nasal congestion, bloody nose, or sore throat. Respiratory: Denies difficulty breathing, shortness of breath, cough or sputum production.   Cardiovascular: Denies chest pain, chest tightness, palpitations or swelling in the hands or feet.  Musculoskeletal: Patient reports right knee swelling.  Denies decrease in range of motion, difficulty with gait, muscle pain or joint pain.  Skin: Pt reports abrasion to right knee. Denies rashes, lesions or ulcercations.    No other specific complaints in a complete review of systems (except as listed in HPI above).  Objective:   Physical Exam  BP 124/82 (BP Location: Right Arm, Patient Position: Sitting, Cuff Size: Normal)   Pulse 89   Temp (!) 96.9 F (36.1 C) (Temporal)   Wt (!) 341 lb (154.7 kg)   SpO2 98%   BMI 41.51 kg/m   Wt Readings from Last 3 Encounters:  12/24/21 (!) 333 lb (151 kg)  09/22/21 (!) 328 lb 6.4 oz (149 kg)  04/27/21 (!) 323 lb 9.6 oz (146.8 kg)    General: Appears his stated age, obese, in NAD. Skin: Abrasion noted to right knee with surrounding redness and warmth concerning for cellulitis. HEENT: Head: normal shape and size;  Ears: Bilateral cerumen impaction. Cardiovascular: Normal rate. Pulmonary/Chest: Normal effort and positive vesicular breath sounds. No respiratory distress. No wheezes, rales or ronchi noted.  Musculoskeletal: Trace swelling noted over the patella of the right knee.  No difficulty with gait.   Neurological: Alert and oriented.   BMET    Component Value Date/Time   NA 142 12/17/2021 0757   NA 140 07/08/2013 0134   K 4.6 12/17/2021 0757   K 3.7 07/08/2013 0134   CL 106 12/17/2021 0757   CL 107 07/08/2013 0134   CO2 26 12/17/2021 0757   CO2 27 07/08/2013 0134   GLUCOSE 96 12/17/2021 0757   GLUCOSE 105 (H) 07/08/2013 0134   BUN 13 12/17/2021 0757   BUN 13 07/08/2013 0134   CREATININE 0.88 12/17/2021 0757   CALCIUM  9.5 12/17/2021 0757   CALCIUM 9.1 07/08/2013 0134   GFRNONAA 101 09/12/2019 1011   GFRAA 117 09/12/2019 1011    Lipid Panel     Component Value Date/Time   CHOL 186 12/17/2021 0757   TRIG 167 (H) 12/17/2021 0757   HDL 42 12/17/2021 0757   CHOLHDL 4.4 12/17/2021 0757   LDLCALC 116 (H) 12/17/2021 0757    CBC    Component Value Date/Time   WBC 7.2 12/17/2021 0757   RBC 5.57 12/17/2021 0757   HGB 15.7 12/17/2021 0757   HGB 14.9 07/08/2013 0134   HCT 48.7 12/17/2021 0757   HCT 44.2 07/08/2013 0134   PLT 260 12/17/2021 0757   PLT 246 07/08/2013 0134   MCV 87.4 12/17/2021 0757   MCV 87 07/08/2013 0134   MCH 28.2 12/17/2021 0757   MCHC 32.2 12/17/2021 0757   RDW 13.4 12/17/2021 0757   RDW 14.1 07/08/2013 0134   LYMPHSABS 3,038 12/17/2021 0757   EOSABS 173 12/17/2021 0757   BASOSABS 43 12/17/2021 0757    Hgb A1C Lab Results  Component Value Date   HGBA1C 5.5 12/17/2021            Assessment & Plan:  Abrasion Right Knee with Cellulitis:  Rx for Keflex 500 mg 3 times daily x7 days Continue to clean with soap and water daily  Bilateral Cerumen Impaction:  Manual lavage by CMA Encourage Debrox 2 times weekly to avoid wax buildup   Follow up with PCP as previously scheduled Webb Silversmith, NP

## 2022-03-22 ENCOUNTER — Ambulatory Visit: Payer: Medicare Other | Admitting: Family Medicine

## 2022-04-14 DIAGNOSIS — G4733 Obstructive sleep apnea (adult) (pediatric): Secondary | ICD-10-CM | POA: Diagnosis not present

## 2022-05-31 ENCOUNTER — Ambulatory Visit (INDEPENDENT_AMBULATORY_CARE_PROVIDER_SITE_OTHER): Payer: Medicare Other | Admitting: Podiatry

## 2022-05-31 ENCOUNTER — Encounter: Payer: Self-pay | Admitting: Podiatry

## 2022-05-31 VITALS — BP 152/92 | HR 85

## 2022-05-31 DIAGNOSIS — B351 Tinea unguium: Secondary | ICD-10-CM

## 2022-05-31 DIAGNOSIS — L84 Corns and callosities: Secondary | ICD-10-CM | POA: Diagnosis not present

## 2022-05-31 DIAGNOSIS — M79674 Pain in right toe(s): Secondary | ICD-10-CM | POA: Diagnosis not present

## 2022-05-31 DIAGNOSIS — M79675 Pain in left toe(s): Secondary | ICD-10-CM

## 2022-05-31 DIAGNOSIS — I872 Venous insufficiency (chronic) (peripheral): Secondary | ICD-10-CM

## 2022-05-31 NOTE — Progress Notes (Signed)
This patient returns to my office for at risk foot care.  This patient requires this care by a professional since this patient will be at risk due to having venous stasis dermatitis  B/L. This patient is accompanied by caregiver.  This patient is unable to cut nails himself since the patient cannot reach his nails.These nails are painful walking and wearing shoes.  He has painful callus both big toes.  This patient presents for at risk foot care today.  General Appearance  Alert, conversant and in no acute stress.  Vascular  Dorsalis pedis and posterior tibial  pulses are palpable  bilaterally.  Capillary return is within normal limits  bilaterally. Temperature is within normal limits  bilaterally.  Neurologic  Senn-Weinstein monofilament wire test within normal limits  bilaterally. Muscle power within normal limits bilaterally.  Nails Thick disfigured discolored nails with subungual debris  from hallux to fifth toes bilaterally. No evidence of bacterial infection or drainage bilaterally.  Orthopedic  No limitations of motion  feet .  No crepitus or effusions noted.  Hallux malleus  B/L.  Hammer toes  B/L.  Skin  normotropic skin with no porokeratosis noted bilaterally.  No signs of infections or ulcers noted.   Pinch callus  B/L  Onychomycosis  Pain in right toes  Pain in left toes  Pinch callus  B/l.   Consent was obtained for treatment procedures.   Mechanical debridement of nails 1-5  bilaterally performed with a nail nipper.  Filed with dremel without incident. Debride callus with # 15 blade.   Return office visit   3 months                  Told patient to return for periodic foot care and evaluation due to potential at risk complications.   Leldon Steege DPM  

## 2022-07-13 DIAGNOSIS — G4733 Obstructive sleep apnea (adult) (pediatric): Secondary | ICD-10-CM | POA: Diagnosis not present

## 2022-09-02 ENCOUNTER — Encounter: Payer: Self-pay | Admitting: Podiatry

## 2022-09-02 ENCOUNTER — Ambulatory Visit (INDEPENDENT_AMBULATORY_CARE_PROVIDER_SITE_OTHER): Payer: Medicare Other | Admitting: Podiatry

## 2022-09-02 DIAGNOSIS — L84 Corns and callosities: Secondary | ICD-10-CM

## 2022-09-02 DIAGNOSIS — B351 Tinea unguium: Secondary | ICD-10-CM | POA: Diagnosis not present

## 2022-09-02 DIAGNOSIS — I872 Venous insufficiency (chronic) (peripheral): Secondary | ICD-10-CM

## 2022-09-02 DIAGNOSIS — M79674 Pain in right toe(s): Secondary | ICD-10-CM

## 2022-09-02 DIAGNOSIS — M79675 Pain in left toe(s): Secondary | ICD-10-CM | POA: Diagnosis not present

## 2022-09-02 NOTE — Progress Notes (Signed)
This patient returns to my office for at risk foot care.  This patient requires this care by a professional since this patient will be at risk due to having venous stasis dermatitis  B/L. This patient is accompanied by caregiver.  This patient is unable to cut nails himself since the patient cannot reach his nails.These nails are painful walking and wearing shoes.  He has painful callus both big toes.  This patient presents for at risk foot care today.  General Appearance  Alert, conversant and in no acute stress.  Vascular  Dorsalis pedis and posterior tibial  pulses are palpable  bilaterally.  Capillary return is within normal limits  bilaterally. Temperature is within normal limits  bilaterally.  Neurologic  Senn-Weinstein monofilament wire test within normal limits  bilaterally. Muscle power within normal limits bilaterally.  Nails Thick disfigured discolored nails with subungual debris  from hallux to fifth toes bilaterally. No evidence of bacterial infection or drainage bilaterally.  Orthopedic  No limitations of motion  feet .  No crepitus or effusions noted.  Hallux malleus  B/L.  Hammer toes  B/L.  Skin  normotropic skin with no porokeratosis noted bilaterally.  No signs of infections or ulcers noted.   Pinch callus  B/L  Onychomycosis  Pain in right toes  Pain in left toes  Pinch callus  B/l.   Consent was obtained for treatment procedures.   Mechanical debridement of nails 1-5  bilaterally performed with a nail nipper.  Filed with dremel without incident. Debride callus with # 15 blade.   Return office visit   3 months                  Told patient to return for periodic foot care and evaluation due to potential at risk complications.   Gardiner Barefoot DPM

## 2022-10-19 DIAGNOSIS — G4733 Obstructive sleep apnea (adult) (pediatric): Secondary | ICD-10-CM | POA: Diagnosis not present

## 2022-11-15 ENCOUNTER — Other Ambulatory Visit: Payer: Self-pay | Admitting: Family Medicine

## 2022-11-15 DIAGNOSIS — F3341 Major depressive disorder, recurrent, in partial remission: Secondary | ICD-10-CM

## 2022-11-15 DIAGNOSIS — K219 Gastro-esophageal reflux disease without esophagitis: Secondary | ICD-10-CM

## 2022-11-16 DIAGNOSIS — G4733 Obstructive sleep apnea (adult) (pediatric): Secondary | ICD-10-CM | POA: Diagnosis not present

## 2022-11-16 NOTE — Telephone Encounter (Signed)
Requested medications are due for refill today.  Yes  Requested medications are on the active medications list.  yes  Last refill. 09/22/2021 #90 3 rf  Future visit scheduled.   yes  Notes to clinic.  Refill not delegated.    Requested Prescriptions  Pending Prescriptions Disp Refills   ARIPiprazole (ABILIFY) 5 MG tablet [Pharmacy Med Name: ARIPIPRAZOLE 5 MG TABLET] 90 tablet 3    Sig: TAKE 1 TABLET BY MOUTH EVERY DAY     Not Delegated - Psychiatry:  Antipsychotics - Second Generation (Atypical) - aripiprazole Failed - 11/15/2022  2:27 AM      Failed - This refill cannot be delegated      Failed - Last BP in normal range    BP Readings from Last 1 Encounters:  05/31/22 (!) 152/92         Failed - Valid encounter within last 6 months    Recent Outpatient Visits           8 months ago Infected abrasion of right knee, initial encounter   [place] [facility] [place], Salvadore Oxford, NP   10 months ago Annual physical exam   [place] [facility] Smitty Cords, DO   1 year ago Morbid obesity [facility])   [place] [facility] Smitty Cords, DO   1 year ago Recurrent major depressive disorder, in partial remission [facility])   [place] [facility] Althea Charon, Netta Neat, DO   2 years ago Annual physical exam   [place] [facility] Trey Sailors, [place]       Future Appointments             In 1 month Althea Charon, Netta Neat, DO  [facility]            Failed - Lipid Panel in normal range within the last 12 months    Cholesterol  Date Value Ref Range Status  12/17/2021 186 <200 mg/dL Final   LDL Cholesterol (Calc)  Date Value Ref Range Status  12/17/2021 116 (H) mg/dL (calc) Final    Comment:    Reference range: <100 . Desirable range <100 mg/dL for primary prevention;   <70 mg/dL for patients with CHD or diabetic patients  with  > or = 2 CHD risk factors. Marland Kitchen LDL-C is now calculated using the Martin-Hopkins  calculation, which is a validated novel method providing  better accuracy than the Friedewald equation in the  estimation of LDL-C.  Horald Pollen et al. Lenox Ahr. 1610;960(45): 2061-2068  (http://education.[facility].com/faq/FAQ164)    HDL  Date Value Ref Range Status  12/17/2021 42 > OR = 40 mg/dL Final   Triglycerides  Date Value Ref Range Status  12/17/2021 167 (H) <150 mg/dL Final         Failed - CMP within normal limits and completed in the last 12 months    Albumin  Date Value Ref Range Status  12/07/2014 4.3 3.5 - 5.0 g/dL Final   Alkaline Phosphatase  Date Value Ref Range Status  12/07/2014 45 38 - 126 U/L Final   Alkaline phosphatase (APISO)  Date Value Ref Range Status  12/17/2021 56 35 - 144 U/L Final   ALT  Date Value Ref Range Status  12/17/2021 22 9 - 46 U/L Final   AST  Date Value Ref Range Status  12/17/2021 21 10 - 35 U/L Final   BUN  Date Value Ref  Range Status  12/17/2021 13 7 - 25 mg/dL Final  45/40/9811 13 7 - 18 mg/dL Final   Calcium  Date Value Ref Range Status  12/17/2021 9.5 8.6 - 10.3 mg/dL Final   Calcium, Total  Date Value Ref Range Status  07/08/2013 9.1 8.5 - 10.1 mg/dL Final   CO2  Date Value Ref Range Status  12/17/2021 26 20 - 32 mmol/L Final   Co2  Date Value Ref Range Status  07/08/2013 27 21 - 32 mmol/L Final   Creat  Date Value Ref Range Status  12/17/2021 0.88 0.70 - 1.30 mg/dL Final   Glucose  Date Value Ref Range Status  07/08/2013 105 (H) 65 - 99 mg/dL Final   Glucose, Bld  Date Value Ref Range Status  12/17/2021 96 65 - 99 mg/dL Final    Comment:    .            Fasting reference interval .    Potassium  Date Value Ref Range Status  12/17/2021 4.6 3.5 - 5.3 mmol/L Final  07/08/2013 3.7 3.5 - 5.1 mmol/L Final   Sodium  Date Value Ref Range Status  12/17/2021 142 135 - 146 mmol/L Final  07/08/2013 140 136 - 145  mmol/L Final   Total Bilirubin  Date Value Ref Range Status  12/17/2021 0.6 0.2 - 1.2 mg/dL Final   Protein, ur  Date Value Ref Range Status  12/07/2014 NEGATIVE NEGATIVE mg/dL Final   Total Protein  Date Value Ref Range Status  12/17/2021 6.9 6.1 - 8.1 g/dL Final   GFR, Est African American  Date Value Ref Range Status  09/12/2019 117 > OR = 60 mL/min/1.65m2 Final   eGFR  Date Value Ref Range Status  12/17/2021 101 > OR = 60 mL/min/1.43m2 Final    Comment:    The eGFR is based on the CKD-EPI 2021 equation. To calculate  the new eGFR from a previous Creatinine or Cystatin C result, go to https://www.kidney.org/professionals/ kdoqi/gfr%5Fcalculator    GFR, Est Non African American  Date Value Ref Range Status  09/12/2019 101 > OR = 60 mL/min/1.89m2 Final         Passed - TSH in normal range and within 360 days    TSH  Date Value Ref Range Status  12/17/2021 1.34 0.40 - 4.50 mIU/L Final         Passed - Completed PHQ-2 or PHQ-9 in the last 360 days      Passed - Last Heart Rate in normal range    Pulse Readings from Last 1 Encounters:  05/31/22 85         Passed - CBC within normal limits and completed in the last 12 months    WBC  Date Value Ref Range Status  12/17/2021 7.2 3.8 - 10.8 Thousand/uL Final   RBC  Date Value Ref Range Status  12/17/2021 5.57 4.20 - 5.80 Million/uL Final   Hemoglobin  Date Value Ref Range Status  12/17/2021 15.7 13.2 - 17.1 g/dL Final   HGB  Date Value Ref Range Status  07/08/2013 14.9 13.0 - 18.0 g/dL Final   HCT  Date Value Ref Range Status  12/17/2021 48.7 38.5 - 50.0 % Final  07/08/2013 44.2 40.0 - 52.0 % Final   MCHC  Date Value Ref Range Status  12/17/2021 32.2 32.0 - 36.0 g/dL Final   [facility]  Date Value Ref Range Status  12/17/2021 28.2 27.0 - 33.0 pg Final   MCV  Date Value Ref Range  Status  12/17/2021 87.4 80.0 - 100.0 fL Final  07/08/2013 87 80 - 100 fL Final   No results found for: "PLTCOUNTKUC",  "LABPLAT", "POCPLA" RDW  Date Value Ref Range Status  12/17/2021 13.4 11.0 - 15.0 % Final  07/08/2013 14.1 11.5 - 14.5 % Final         Signed Prescriptions Disp Refills   pantoprazole (PROTONIX) 40 MG tablet 90 tablet 0    Sig: TAKE 1 TABLET (40 MG TOTAL) BY MOUTH DAILY BEFORE BREAKFAST. FOR STOMACH ACID     Gastroenterology: Proton Pump Inhibitors Passed - 11/15/2022  2:27 AM      Passed - Valid encounter within last 12 months    Recent Outpatient Visits           8 months ago Infected abrasion of right knee, initial encounter   [place] [facility] [place], Salvadore Oxford, NP   10 months ago Annual physical exam   [place] [facility] Smitty Cords, DO   1 year ago Morbid obesity [facility])   [place] [facility] Smitty Cords, DO   1 year ago Recurrent major depressive disorder, in partial remission [facility])   [place] [facility] Smitty Cords, DO   2 years ago Annual physical exam   [place] [facility] Trey Sailors, [place]       Future Appointments             In 1 month Althea Charon, Netta Neat, DO [place] [facility], [place]             s

## 2022-11-16 NOTE — Telephone Encounter (Signed)
Requested Prescriptions  Pending Prescriptions Disp Refills   ARIPiprazole (ABILIFY) 5 MG tablet [Pharmacy Med Name: ARIPIPRAZOLE 5 MG TABLET] 90 tablet 3    Sig: TAKE 1 TABLET BY MOUTH EVERY DAY     Not Delegated - Psychiatry:  Antipsychotics - Second Generation (Atypical) - aripiprazole Failed - 11/15/2022  2:27 AM      Failed - This refill cannot be delegated      Failed - Last BP in normal range    BP Readings from Last 1 Encounters:  05/31/22 (!) 152/92         Failed - Valid encounter within last 6 months    Recent Outpatient Visits           8 months ago Infected abrasion of right knee, initial encounter   [place] [facility] [place], Salvadore Oxford, NP   10 months ago Annual physical exam   [place] [facility] Smitty Cords, DO   1 year ago Morbid obesity [facility])   [place] [facility] Smitty Cords, DO   1 year ago Recurrent major depressive disorder, in partial remission [facility])   [place] [facility] Althea Charon, Netta Neat, DO   2 years ago Annual physical exam   [place] [facility] Trey Sailors, [place]       Future Appointments             In 1 month Althea Charon, Netta Neat, DO  [facility]            Failed - Lipid Panel in normal range within the last 12 months    Cholesterol  Date Value Ref Range Status  12/17/2021 186 <200 mg/dL Final   LDL Cholesterol (Calc)  Date Value Ref Range Status  12/17/2021 116 (H) mg/dL (calc) Final    Comment:    Reference range: <100 . Desirable range <100 mg/dL for primary prevention;   <70 mg/dL for patients with CHD or diabetic patients  with > or = 2 CHD risk factors. Marland Kitchen LDL-C is now calculated using the Martin-Hopkins  calculation, which is a validated novel method providing  better accuracy than the Friedewald equation in the  estimation of LDL-C.  Horald Pollen et al.  Lenox Ahr. 1610;960(45): 2061-2068  (http://education.[facility].com/faq/FAQ164)    HDL  Date Value Ref Range Status  12/17/2021 42 > OR = 40 mg/dL Final   Triglycerides  Date Value Ref Range Status  12/17/2021 167 (H) <150 mg/dL Final         Failed - CMP within normal limits and completed in the last 12 months    Albumin  Date Value Ref Range Status  12/07/2014 4.3 3.5 - 5.0 g/dL Final   Alkaline Phosphatase  Date Value Ref Range Status  12/07/2014 45 38 - 126 U/L Final   Alkaline phosphatase (APISO)  Date Value Ref Range Status  12/17/2021 56 35 - 144 U/L Final   ALT  Date Value Ref Range Status  12/17/2021 22 9 - 46 U/L Final   AST  Date Value Ref Range Status  12/17/2021 21 10 - 35 U/L Final   BUN  Date Value Ref Range Status  12/17/2021 13 7 - 25 mg/dL Final  40/98/1191 13 7 - 18 mg/dL Final   Calcium  Date Value Ref Range Status  12/17/2021 9.5 8.6 - 10.3 mg/dL Final   Calcium, Total  Date Value Ref Range Status  07/08/2013 9.1 8.5 - 10.1 mg/dL Final   CO2  Date Value Ref Range Status  12/17/2021 26 20 - 32 mmol/L Final   Co2  Date Value Ref Range Status  07/08/2013 27 21 - 32 mmol/L Final   Creat  Date Value Ref Range Status  12/17/2021 0.88 0.70 - 1.30 mg/dL Final   Glucose  Date Value Ref Range Status  07/08/2013 105 (H) 65 - 99 mg/dL Final   Glucose, Bld  Date Value Ref Range Status  12/17/2021 96 65 - 99 mg/dL Final    Comment:    .            Fasting reference interval .    Potassium  Date Value Ref Range Status  12/17/2021 4.6 3.5 - 5.3 mmol/L Final  07/08/2013 3.7 3.5 - 5.1 mmol/L Final   Sodium  Date Value Ref Range Status  12/17/2021 142 135 - 146 mmol/L Final  07/08/2013 140 136 - 145 mmol/L Final   Total Bilirubin  Date Value Ref Range Status  12/17/2021 0.6 0.2 - 1.2 mg/dL Final   Protein, ur  Date Value Ref Range Status  12/07/2014 NEGATIVE NEGATIVE mg/dL Final   Total Protein  Date Value Ref Range  Status  12/17/2021 6.9 6.1 - 8.1 g/dL Final   GFR, Est African American  Date Value Ref Range Status  09/12/2019 117 > OR = 60 mL/min/1.54m2 Final   eGFR  Date Value Ref Range Status  12/17/2021 101 > OR = 60 mL/min/1.83m2 Final    Comment:    The eGFR is based on the CKD-EPI 2021 equation. To calculate  the new eGFR from a previous Creatinine or Cystatin C result, go to https://www.kidney.org/professionals/ kdoqi/gfr%5Fcalculator    GFR, Est Non African American  Date Value Ref Range Status  09/12/2019 101 > OR = 60 mL/min/1.34m2 Final         Passed - TSH in normal range and within 360 days    TSH  Date Value Ref Range Status  12/17/2021 1.34 0.40 - 4.50 mIU/L Final         Passed - Completed PHQ-2 or PHQ-9 in the last 360 days      Passed - Last Heart Rate in normal range    Pulse Readings from Last 1 Encounters:  05/31/22 85         Passed - CBC within normal limits and completed in the last 12 months    WBC  Date Value Ref Range Status  12/17/2021 7.2 3.8 - 10.8 Thousand/uL Final   RBC  Date Value Ref Range Status  12/17/2021 5.57 4.20 - 5.80 Million/uL Final   Hemoglobin  Date Value Ref Range Status  12/17/2021 15.7 13.2 - 17.1 g/dL Final   HGB  Date Value Ref Range Status  07/08/2013 14.9 13.0 - 18.0 g/dL Final   HCT  Date Value Ref Range Status  12/17/2021 48.7 38.5 - 50.0 % Final  07/08/2013 44.2 40.0 - 52.0 % Final   MCHC  Date Value Ref Range Status  12/17/2021 32.2 32.0 - 36.0 g/dL Final   [facility]  Date Value Ref Range Status  12/17/2021 28.2 27.0 - 33.0 pg Final   MCV  Date Value Ref Range Status  12/17/2021 87.4 80.0 - 100.0 fL Final  07/08/2013 87 80 - 100 fL Final   No results found for: "PLTCOUNTKUC", "LABPLAT", "POCPLA" RDW  Date Value Ref Range Status  12/17/2021 13.4 11.0 - 15.0 % Final  07/08/2013 14.1 11.5 -  14.5 % Final          pantoprazole (PROTONIX) 40 MG tablet [Pharmacy Med Name: PANTOPRAZOLE SOD DR 40 MG TAB] 90  tablet 0    Sig: TAKE 1 TABLET (40 MG TOTAL) BY MOUTH DAILY BEFORE BREAKFAST. FOR STOMACH ACID     Gastroenterology: Proton Pump Inhibitors Passed - 11/15/2022  2:27 AM      Passed - Valid encounter within last 12 months    Recent Outpatient Visits           8 months ago Infected abrasion of right knee, initial encounter   [place] [facility] [place], Salvadore Oxford, NP   10 months ago Annual physical exam   [place] [facility] Smitty Cords, DO   1 year ago Morbid obesity [facility])   [place] [facility] Smitty Cords, DO   1 year ago Recurrent major depressive disorder, in partial remission [facility])   [place] [facility] Smitty Cords, DO   2 years ago Annual physical exam   [place] [facility] Trey Sailors, [place]       Future Appointments             In 1 month Althea Charon, Netta Neat, DO  [facility], [facility]

## 2022-12-03 ENCOUNTER — Ambulatory Visit (INDEPENDENT_AMBULATORY_CARE_PROVIDER_SITE_OTHER): Payer: Medicare Other | Admitting: Podiatry

## 2022-12-03 ENCOUNTER — Encounter: Payer: Self-pay | Admitting: Podiatry

## 2022-12-03 VITALS — BP 141/84

## 2022-12-03 DIAGNOSIS — B351 Tinea unguium: Secondary | ICD-10-CM

## 2022-12-03 DIAGNOSIS — M79675 Pain in left toe(s): Secondary | ICD-10-CM

## 2022-12-03 DIAGNOSIS — M79674 Pain in right toe(s): Secondary | ICD-10-CM | POA: Diagnosis not present

## 2022-12-03 NOTE — Progress Notes (Signed)
  Subjective:  Patient ID: Harrie Foreman, male    DOB: [birth year],  MRN: [id number]  Shawnie Dapper Basher presents to clinic today for  Chief Complaint  Patient presents with   Nail Problem    RFC,Referring Provider Smitty Cords, DO,LOV:09/23      New problem(s): None.   PCP is Smitty Cords, DO.  Allergies  Allergen Reactions   Penicillins    Latex Other (See Comments) and Rash    blister   Latex Rash    Review of Systems: Negative except as noted in the HPI. Objective:   Vitals:   12/03/22 0830  BP: (!) 141/84   Constitutional ELYJIAH BOROWSKY is a pleasant 57 y.o. male, morbidly obese in NAD. AAO x 3.   Vascular Vascular Examination: Capillary refill time immediate b/l. Vascular status intact b/l with palpable pedal pulses. Pedal hair sparse. No edema. No pain with calf compression b/l. Skin temperature gradient WNL b/l.   Neurological Examination: Sensation grossly intact b/l with 10 gram monofilament. Vibratory sensation intact b/l.   Dermatological Examination: Pedal skin with normal turgor, texture and tone b/l.  No open wounds. No interdigital macerations.   Toenails 1-5 b/l thick, discolored, elongated with subungual debris and pain on dorsal palpation.   Hyperkeratotic lesion(s) right heel, bilateral great toes, and R 4th toe.  No erythema, no edema, no drainage, no fluctuance.  Musculoskeletal Examination: Muscle strength 5/5 to all lower extremity muscle groups bilaterally. Hallux malleus b/l. Hammertoe deformity noted 2-5 b/l.  Radiographs: None  Last A1c:      Latest Ref Rng & Units 12/17/2021    7:57 AM  Hemoglobin A1C  Hemoglobin-A1c <5.7 % of total Hgb 5.5      Assessment:   1. Pain due to onychomycosis of toenails of both feet    Plan:  Patient was evaluated and treated and all questions answered. Consent given for treatment as described below: -Patient was evaluated and treated. All patient's and/or POA's questions/concerns  answered on today's visit. -Will submit preauthorization to insurance company for paring of corns/calluses. -Patient to continue soft, supportive shoe gear daily. -Toenails 1-5 b/l were debrided in length and girth with sterile nail nippers and dremel without iatrogenic bleeding.  -As a courtesy, corn(s) R 4th toe pared utilizing sterile scalpel blade without complication or incident. Total number pared=1. -As a courtesy, callus(es) right heel and bilateral great toes gently filed without complication or incident. Total number pared=3. -Patient/POA to call should there be question/concern in the interim.  Return in about 3 months (around 03/05/2023).  Freddie Breech, DPM

## 2023-01-07 ENCOUNTER — Other Ambulatory Visit: Payer: Medicare Other

## 2023-01-14 ENCOUNTER — Encounter: Payer: Medicare Other | Admitting: Family Medicine

## 2023-01-26 ENCOUNTER — Encounter: Payer: Medicare Other | Admitting: Dermatology

## 2023-02-02 ENCOUNTER — Encounter: Payer: Self-pay | Admitting: Dermatology

## 2023-02-02 ENCOUNTER — Ambulatory Visit (INDEPENDENT_AMBULATORY_CARE_PROVIDER_SITE_OTHER): Payer: Medicare Other | Admitting: Dermatology

## 2023-02-02 DIAGNOSIS — L814 Other melanin hyperpigmentation: Secondary | ICD-10-CM | POA: Diagnosis not present

## 2023-02-02 DIAGNOSIS — L570 Actinic keratosis: Secondary | ICD-10-CM

## 2023-02-02 DIAGNOSIS — W908XXA Exposure to other nonionizing radiation, initial encounter: Secondary | ICD-10-CM | POA: Diagnosis not present

## 2023-02-02 DIAGNOSIS — D229 Melanocytic nevi, unspecified: Secondary | ICD-10-CM

## 2023-02-02 DIAGNOSIS — D2372 Other benign neoplasm of skin of left lower limb, including hip: Secondary | ICD-10-CM

## 2023-02-02 DIAGNOSIS — Z1283 Encounter for screening for malignant neoplasm of skin: Secondary | ICD-10-CM

## 2023-02-02 DIAGNOSIS — L578 Other skin changes due to chronic exposure to nonionizing radiation: Secondary | ICD-10-CM

## 2023-02-02 DIAGNOSIS — Z872 Personal history of diseases of the skin and subcutaneous tissue: Secondary | ICD-10-CM | POA: Diagnosis not present

## 2023-02-02 DIAGNOSIS — D239 Other benign neoplasm of skin, unspecified: Secondary | ICD-10-CM

## 2023-02-02 DIAGNOSIS — L821 Other seborrheic keratosis: Secondary | ICD-10-CM

## 2023-02-02 DIAGNOSIS — D1801 Hemangioma of skin and subcutaneous tissue: Secondary | ICD-10-CM | POA: Diagnosis not present

## 2023-02-02 NOTE — Patient Instructions (Addendum)
Actinic Keratosis at right eyebrow.  Treatment options are to freeze with liquid nitrogen, treat topically with a cream or biopsy. No urgency and can wait until follow up unless it becomes painful or bleeds.  Melanoma ABCDEs  Melanoma is the most dangerous type of skin cancer, and is the leading cause of death from skin disease.  You are more likely to develop melanoma if you: Have light-colored skin, light-colored eyes, or red or blond hair Spend a lot of time in the sun Tan regularly, either outdoors or in a tanning bed Have had blistering sunburns, especially during childhood Have a close family member who has had a melanoma Have atypical moles or large birthmarks  Early detection of melanoma is key since treatment is typically straightforward and cure rates are extremely high if we catch it early.   The first sign of melanoma is often a change in a mole or a new dark spot.  The ABCDE system is a way of remembering the signs of melanoma.  A for asymmetry:  The two halves do not match. B for border:  The edges of the growth are irregular. C for color:  A mixture of colors are present instead of an even brown color. D for diameter:  Melanomas are usually (but not always) greater than 6mm - the size of a pencil eraser. E for evolution:  The spot keeps changing in size, shape, and color.  Please check your skin once per month between visits. You can use a small mirror in front and a large mirror behind you to keep an eye on the back side or your body.   If you see any new or changing lesions before your next follow-up, please call to schedule a visit.  Please continue daily skin protection including broad spectrum sunscreen SPF 30+ to sun-exposed areas, reapplying every 2 hours as needed when you're outdoors.    Due to recent changes in healthcare laws, you may see results of your pathology and/or laboratory studies on MyChart before the doctors have had a chance to review them. We  understand that in some cases there may be results that are confusing or concerning to you. Please understand that not all results are received at the same time and often the doctors may need to interpret multiple results in order to provide you with the best plan of care or course of treatment. Therefore, we ask that you please give [place] 2 business days to thoroughly review all your results before contacting the office for clarification. Should we see a critical lab result, you will be contacted sooner.   If You Need Anything After Your Visit  If you have any questions or concerns for your doctor, please call our main line at [phone] and press option 4 to reach your doctor's medical assistant. If no one answers, please leave a voicemail as directed and we will return your call as soon as possible. Messages left after 4 pm will be answered the following business day.   You may also send [place] a message via MyChart. We typically respond to MyChart messages within 1-2 business days.  For prescription refills, please ask your pharmacy to contact our office. Our fax number is [phone].  If you have an urgent issue when the clinic is closed that cannot wait until the next business day, you can page your doctor at the number below.    Please note that while we do our best to be available for urgent issues outside of  office hours, we are not available 24/7.   If you have an urgent issue and are unable to reach [place], you may choose to seek medical care at your doctor's office, retail clinic, urgent care center, or emergency room.  If you have a medical emergency, please immediately call 911 or go to the emergency department.  Pager Numbers  - Dr. Gwen Pounds: [phone]  - Dr. Roseanne Reno: [phone]  In the event of inclement weather, please call our main line at [phone] for an update on the status of any delays or closures.  Dermatology Medication Tips: Please keep the boxes that topical  medications come in in order to help keep track of the instructions about where and how to use these. Pharmacies typically print the medication instructions only on the boxes and not directly on the medication tubes.   If your medication is too expensive, please contact our office at [phone] option 4 or send [place] a message through MyChart.   We are unable to tell what your co-pay for medications will be in advance as this is different depending on your insurance coverage. However, we may be able to find a substitute medication at lower cost or fill out paperwork to get insurance to cover a needed medication.   If a prior authorization is required to get your medication covered by your insurance company, please allow [place] 1-2 business days to complete this process.  Drug prices often vary depending on where the prescription is filled and some pharmacies may offer cheaper prices.  The website www.goodrx.com contains coupons for medications through different pharmacies. The prices here do not account for what the cost may be with help from insurance (it may be cheaper with your insurance), but the website can give you the price if you did not use any insurance.  - You can print the associated coupon and take it with your prescription to the pharmacy.  - You may also stop by our office during regular business hours and pick up a GoodRx coupon card.  - If you need your prescription sent electronically to a different pharmacy, notify our office through [facility] or by phone at [phone] option 4.

## 2023-02-02 NOTE — Progress Notes (Signed)
Follow-Up Visit   Subjective  Mario Silva is a 57 y.o. male who presents for the following: Skin Cancer Screening and Full Body Skin Exam  The patient presents for Total-Body Skin Exam (TBSE) for skin cancer screening and mole check. The patient has spots, moles and lesions to be evaluated, some may be new or changing and the patient may have concern these could be cancer.  Hx AK.   The following portions of the chart were reviewed this encounter and updated as appropriate: medications, allergies, medical history  Review of Systems:  No other skin or systemic complaints except as noted in HPI or Assessment and Plan.  Objective  Well appearing patient in no apparent distress; mood and affect are within normal limits.  A full examination was performed including scalp, head, eyes, ears, nose, lips, neck, chest, axillae, abdomen, back, buttocks, bilateral upper extremities, bilateral lower extremities, hands, feet, fingers, toes, fingernails, and toenails. All findings within normal limits unless otherwise noted below.   Relevant physical exam findings are noted in the Assessment and Plan.  right brow Erythematous thin papules/macules with gritty scale.     Assessment & Plan   SKIN CANCER SCREENING PERFORMED TODAY.  ACTINIC DAMAGE - Chronic condition, secondary to cumulative UV/sun exposure - diffuse scaly erythematous macules with underlying dyspigmentation - Recommend daily broad spectrum sunscreen SPF 30+ to sun-exposed areas, reapply every 2 hours as needed.  - Staying in the shade or wearing long sleeves, sun glasses (UVA+UVB protection) and wide brim hats (4-inch brim around the entire circumference of the hat) are also recommended for sun protection.  - Call for new or changing lesions.  LENTIGINES, SEBORRHEIC KERATOSES, HEMANGIOMAS - Benign normal skin lesions - Benign-appearing - Call for any changes  MELANOCYTIC NEVI - Tan-brown and/or pink-flesh-colored symmetric  macules and papules - Benign appearing on exam today - Observation - Call clinic for new or changing moles - Recommend daily use of broad spectrum spf 30+ sunscreen to sun-exposed areas.   HISTORY OF PRECANCEROUS ACTINIC KERATOSIS - site(s) of PreCancerous Actinic Keratosis clear today. - these may recur and new lesions may form requiring treatment to prevent transformation into skin cancer - observe for new or changing spots and contact [name] for appointment if occur - photoprotection with sun protective clothing; sunglasses and broad spectrum sunscreen with SPF of at least 30 + and frequent self skin exams recommended - yearly exams by a dermatologist recommended for persons with history of PreCancerous Actinic Keratoses     AK (actinic keratosis) right brow  Actinic keratoses are precancerous spots that appear secondary to cumulative UV radiation exposure/sun exposure over time. They are chronic with expected duration over 1 year. A portion of actinic keratoses will progress to squamous cell carcinoma of the skin. It is not possible to reliably predict which spots will progress to skin cancer and so treatment is recommended to prevent development of skin cancer.  Recommend daily broad spectrum sunscreen SPF 30+ to sun-exposed areas, reapply every 2 hours as needed.  Recommend staying in the shade or wearing long sleeves, sun glasses (UVA+UVB protection) and wide brim hats (4-inch brim around the entire circumference of the hat). Call for new or changing lesions.  Patient and patient's caretaker will discuss with patient's sister about treatment, options are to treat with LN2, biopsy or treat topically.   Multiple benign nevi  Seborrheic keratoses  Cherry angioma  Actinic elastosis  Dermatofibroma  Lentigo  DERMATOFIBROMA Exam: Firm pink/brown papulenodule with  dimple sign at left anterior and left posterior thigh. Treatment Plan: A dermatofibroma is a  benign growth possibly related to trauma, such as an insect bite, cut from shaving, or inflamed acne-type bump.  Treatment options to remove include shave or excision with resulting scar and risk of recurrence.  Since benign-appearing and not bothersome, will observe for now.   Return in about 1 year (around 02/02/2024) for TBSE, Hx AK.  Mario Silva, RMA, am acting as scribe for Elie Goody, MD .   Documentation: I have reviewed the above documentation for accuracy and completeness, and I agree with the above.  Elie Goody, MD

## 2023-02-03 ENCOUNTER — Other Ambulatory Visit: Payer: Self-pay

## 2023-02-03 DIAGNOSIS — Z0000 Encounter for general adult medical examination without abnormal findings: Secondary | ICD-10-CM

## 2023-02-03 DIAGNOSIS — R7303 Prediabetes: Secondary | ICD-10-CM

## 2023-02-03 DIAGNOSIS — E782 Mixed hyperlipidemia: Secondary | ICD-10-CM

## 2023-02-03 DIAGNOSIS — F419 Anxiety disorder, unspecified: Secondary | ICD-10-CM

## 2023-02-03 DIAGNOSIS — Z125 Encounter for screening for malignant neoplasm of prostate: Secondary | ICD-10-CM

## 2023-02-04 ENCOUNTER — Other Ambulatory Visit: Payer: Medicare Other

## 2023-02-04 DIAGNOSIS — R7303 Prediabetes: Secondary | ICD-10-CM | POA: Diagnosis not present

## 2023-02-04 DIAGNOSIS — E782 Mixed hyperlipidemia: Secondary | ICD-10-CM | POA: Diagnosis not present

## 2023-02-04 DIAGNOSIS — Z0000 Encounter for general adult medical examination without abnormal findings: Secondary | ICD-10-CM | POA: Diagnosis not present

## 2023-02-10 ENCOUNTER — Encounter: Payer: Medicare Other | Admitting: Dermatology

## 2023-02-11 ENCOUNTER — Encounter: Payer: Medicare Other | Admitting: Family Medicine

## 2023-02-22 ENCOUNTER — Encounter: Payer: Medicare Other | Admitting: Family Medicine

## 2023-02-22 DIAGNOSIS — G4733 Obstructive sleep apnea (adult) (pediatric): Secondary | ICD-10-CM | POA: Diagnosis not present

## 2023-03-04 ENCOUNTER — Encounter: Payer: Medicare Other | Admitting: Family Medicine

## 2023-03-11 ENCOUNTER — Encounter: Payer: Self-pay | Admitting: Podiatry

## 2023-03-11 ENCOUNTER — Ambulatory Visit (INDEPENDENT_AMBULATORY_CARE_PROVIDER_SITE_OTHER): Payer: Medicare Other | Admitting: Podiatry

## 2023-03-11 VITALS — BP 140/83 | HR 78

## 2023-03-11 DIAGNOSIS — M79672 Pain in left foot: Secondary | ICD-10-CM

## 2023-03-11 DIAGNOSIS — Q828 Other specified congenital malformations of skin: Secondary | ICD-10-CM | POA: Diagnosis not present

## 2023-03-11 DIAGNOSIS — L84 Corns and callosities: Secondary | ICD-10-CM

## 2023-03-11 DIAGNOSIS — B351 Tinea unguium: Secondary | ICD-10-CM | POA: Diagnosis not present

## 2023-03-11 DIAGNOSIS — M79671 Pain in right foot: Secondary | ICD-10-CM | POA: Diagnosis not present

## 2023-03-11 DIAGNOSIS — M79675 Pain in left toe(s): Secondary | ICD-10-CM | POA: Diagnosis not present

## 2023-03-11 DIAGNOSIS — M79674 Pain in right toe(s): Secondary | ICD-10-CM | POA: Diagnosis not present

## 2023-03-11 NOTE — Progress Notes (Unsigned)
Subjective:  Patient ID: Mario Silva, male    DOB: December 31, 1965,  MRN: [id number]  Mario Silva presents to clinic today for: {jgcomplaint:23593}  Chief Complaint  Patient presents with   Nail Problem    "Cut my toenails."    PCP is Smitty Cords, DO.  Allergies  Allergen Reactions   Penicillins    Latex Other (See Comments) and Rash    blister   Latex Rash    Review of Systems: Negative except as noted in the HPI.  Objective: No changes noted in today's physical examination. Vitals:   03/11/23 0849  BP: (!) 140/83  Pulse: 78    Mario Silva is a pleasant 57 y.o. male in NAD. AAO x 3.  Vascular Examination: Capillary refill time <3 seconds b/l LE. Palpable pedal pulses b/l LE. Digital hair present b/l. No pedal edema b/l. Skin temperature gradient WNL b/l. No varicosities b/l. {jgvascular:23595}.  Dermatological Examination: Pedal skin with normal turgor, texture and tone b/l. No open wounds. No interdigital macerations b/l. Toenails 1-5 b/l thickened, discolored, dystrophic with subungual debris. There is pain on palpation to dorsal aspect of nailplates. {jgderm:23598}.  Neurological Examination: Protective sensation intact with 10 gram monofilament b/l LE. Vibratory sensation intact b/l LE. {jgneuro:23601::"Protective sensation intact 5/5 intact bilaterally with 10g monofilament b/l.","Vibratory sensation intact b/l.","Proprioception intact bilaterally."}  Musculoskeletal Examination: {jgmsk:23600}     Latest Ref Rng & Units 02/04/2023    8:38 AM  Hemoglobin A1C  Hemoglobin-A1c <5.7 % of total Hgb 5.8    Assessment/Plan: 1. Pain due to onychomycosis of toenails of both feet   2. Callus   3. Porokeratosis     No orders of the defined types were placed in this encounter.  None {Jgplan:23602::"-Patient/POA to call should there be question/concern in the interim."}   Return in about 12 weeks (around 06/03/2023).  Freddie Breech, DPM

## 2023-05-20 ENCOUNTER — Encounter: Payer: Medicare Other | Admitting: Family Medicine

## 2023-06-03 ENCOUNTER — Encounter: Payer: Self-pay | Admitting: Podiatry

## 2023-06-03 ENCOUNTER — Ambulatory Visit (INDEPENDENT_AMBULATORY_CARE_PROVIDER_SITE_OTHER): Payer: Medicare Other | Admitting: Podiatry

## 2023-06-03 DIAGNOSIS — M79672 Pain in left foot: Secondary | ICD-10-CM | POA: Diagnosis not present

## 2023-06-03 DIAGNOSIS — M79675 Pain in left toe(s): Secondary | ICD-10-CM | POA: Diagnosis not present

## 2023-06-03 DIAGNOSIS — M79674 Pain in right toe(s): Secondary | ICD-10-CM | POA: Diagnosis not present

## 2023-06-03 DIAGNOSIS — L84 Corns and callosities: Secondary | ICD-10-CM | POA: Diagnosis not present

## 2023-06-03 DIAGNOSIS — B351 Tinea unguium: Secondary | ICD-10-CM | POA: Diagnosis not present

## 2023-06-03 DIAGNOSIS — M79671 Pain in right foot: Secondary | ICD-10-CM

## 2023-06-03 NOTE — Progress Notes (Unsigned)
  Subjective:  Patient ID: Mario Silva, male    DOB: [birth year],  MRN: [id number]  57 y.o. male presents to clinic with  {jgcomplaint:23593} No chief complaint on file.    New problem(s): None {jgcomplaint:23593}  PCP is Smitty Cords, DO.  Allergies  Allergen Reactions   Penicillins    Latex Other (See Comments) and Rash    blister   Latex Rash    Review of Systems: Negative except as noted in the HPI.   Objective:  Mario Silva is a pleasant 57 y.o. male {jgbodyhabitus:24098}. AAO x 3.  Vascular Examination: Capillary refill time <3 seconds b/l LE. Palpable pedal pulses b/l LE. Digital hair present b/l. No pedal edema b/l. Skin temperature gradient WNL b/l. No varicosities b/l. Marland Kitchen  Dermatological Examination: Pedal skin with normal turgor, texture and tone b/l. No open wounds. No interdigital macerations b/l. Toenails 1-5 b/l thickened, discolored, dystrophic with subungual debris. There is pain on palpation to dorsal aspect of nailplates. Hyperkeratotic lesion(s) right heel, bilateral great toes, and right fourth digit.  No erythema, no edema, no drainage, no fluctuance..  Neurological Examination: Protective sensation intact with 10 gram monofilament b/l LE. Vibratory sensation intact b/l LE.   Musculoskeletal Examination: Muscle strength 5/5 to all lower extremity muscle groups bilaterally. Hallux hammertoe noted b/l LE. Hammertoe deformity noted 2-5 b/l.  Radiographs: None  Last A1c:      Latest Ref Rng & Units 02/04/2023    8:38 AM  Hemoglobin A1C  Hemoglobin-A1c <5.7 % of total Hgb 5.8      Assessment:   1. Pain due to onychomycosis of toenails of both feet   2. Corns and callosities   3. Pain in both feet    Plan:  {jgplan:23602::"-Patient/POA to call should there be question/concern in the interim."}  Return in about 3 months (around 09/01/2023).  Freddie Breech, DPM      [place] LOCATION: [address], [place] [address]                   Office [phone]   [facility] LOCATION: [address] [place], [place] [address] Office [phone]

## 2023-06-08 ENCOUNTER — Ambulatory Visit (INDEPENDENT_AMBULATORY_CARE_PROVIDER_SITE_OTHER): Payer: Medicare Other | Admitting: Family Medicine

## 2023-06-08 ENCOUNTER — Encounter: Payer: Self-pay | Admitting: Family Medicine

## 2023-06-08 VITALS — BP 130/84 | HR 78 | Ht 76.0 in | Wt 349.0 lb

## 2023-06-08 DIAGNOSIS — J0110 Acute frontal sinusitis, unspecified: Secondary | ICD-10-CM

## 2023-06-08 DIAGNOSIS — F819 Developmental disorder of scholastic skills, unspecified: Secondary | ICD-10-CM

## 2023-06-08 DIAGNOSIS — E782 Mixed hyperlipidemia: Secondary | ICD-10-CM | POA: Diagnosis not present

## 2023-06-08 DIAGNOSIS — M25562 Pain in left knee: Secondary | ICD-10-CM | POA: Diagnosis not present

## 2023-06-08 DIAGNOSIS — G8929 Other chronic pain: Secondary | ICD-10-CM

## 2023-06-08 DIAGNOSIS — Z0000 Encounter for general adult medical examination without abnormal findings: Secondary | ICD-10-CM | POA: Diagnosis not present

## 2023-06-08 DIAGNOSIS — R42 Dizziness and giddiness: Secondary | ICD-10-CM | POA: Diagnosis not present

## 2023-06-08 DIAGNOSIS — F70 Mild intellectual disabilities: Secondary | ICD-10-CM

## 2023-06-08 DIAGNOSIS — K219 Gastro-esophageal reflux disease without esophagitis: Secondary | ICD-10-CM

## 2023-06-08 DIAGNOSIS — F419 Anxiety disorder, unspecified: Secondary | ICD-10-CM

## 2023-06-08 DIAGNOSIS — R7303 Prediabetes: Secondary | ICD-10-CM

## 2023-06-08 DIAGNOSIS — F3341 Major depressive disorder, recurrent, in partial remission: Secondary | ICD-10-CM

## 2023-06-08 MED ORDER — LORAZEPAM 0.5 MG PO TABS: 0.2500 mg | ORAL_TABLET | Freq: Every day | ORAL | 1 refills | Status: AC | PRN

## 2023-06-08 MED ORDER — PANTOPRAZOLE SODIUM 40 MG PO TBEC: 40.0000 mg | DELAYED_RELEASE_TABLET | Freq: Every day | ORAL | 3 refills | Status: DC

## 2023-06-08 MED ORDER — MECLIZINE HCL 25 MG PO TABS
25.0000 mg | ORAL_TABLET | Freq: Three times a day (TID) | ORAL | 1 refills | Status: AC | PRN
Start: 2023-06-08 — End: ?

## 2023-06-08 MED ORDER — AZITHROMYCIN 250 MG PO TABS
ORAL_TABLET | ORAL | 0 refills | Status: AC
Start: 2023-06-08 — End: 2023-06-13

## 2023-06-08 NOTE — Assessment & Plan Note (Signed)
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 

## 2023-06-08 NOTE — Progress Notes (Signed)
Subjective:    Patient ID: Mario Silva, male    DOB: Sep 26, 1965, 57 y.o.   MRN: [id number]  Mario Silva is a 57 y.o. male presenting on 06/08/2023 for Annual Exam (Yearly check up )   HPI  Discussed the use of AI scribe software for clinical note transcription with the patient, who gave verbal consent to proceed.  Living at Group Home several days a week, and then will stay with family on other days.    HPI     Major Depression recurrent / Anxiety Continues on Abilify 5mg  daily Has Lorazepam PRN if need. Infrequent. Needs re order.   Episodic Vertigo / Inner Ear Problem Episodes of vertigo on occasion, has improved with Meclizine PRN. Rarely taking. Needs re order   GERD Chronic problem has Pantoprazole 40mg  daily however he was only taking this 3 times a week. He has upset stomach and digestive problems often. He has taken Gas X PRN for indigestion   HYPERLIPIDEMIA: - Reports no concerns. Last lipid panel 01/2023, mostly normal with LDL 99 Not on medication Lifestyle - Diet: Improving - Exercise: Walking regularly  Pre-Diabetes Elevated A1c 5.8 Prior range 5.0 to 5.5  Additionall updates  Cough URI Sick contacts recently Cough productive  Cat allergy exposure Has not tried much OTC yet     Health Maintenance: UTD reviewed colonoscopy screening, vaccines.   PSA 0.29  negative. 01/2023      06/08/2023    9:17 AM 03/19/2022    2:34 PM 12/24/2021    9:05 AM  Depression screen PHQ 2/9  Decreased Interest 0 0 0  Down, Depressed, Hopeless 0 0 0  PHQ - 2 Score 0 0 0  Altered sleeping  0 0  Tired, decreased energy  0 0  Change in appetite  0 0  Feeling bad or failure about yourself   0 0  Trouble concentrating  0 0  Moving slowly or fidgety/restless  0 0  Suicidal thoughts  0 0  PHQ-9 Score  0 0  Difficult doing work/chores  Not difficult at all Not difficult at all       06/08/2023    9:17 AM 03/19/2022    3:59 PM 09/22/2021    8:48 AM  09/12/2019    9:29 AM  GAD 7 : Generalized Anxiety Score  Nervous, Anxious, on Edge 0 0 0 0  Control/stop worrying 0 0 0 1  Worry too much - different things 0 0 0 0  Trouble relaxing 0 0 0 0  Restless 0 0 0 0  Easily annoyed or irritable 1 0 0 3  Afraid - awful might happen 0 0 0 0  Total GAD 7 Score 1 0 0 4  Anxiety Difficulty  Not difficult at all Not difficult at all Not difficult at all     Past Medical History:  Diagnosis Date   Acid reflux    Actinic keratosis 08/30/2019   right eyebrow, bx proven   Anxiety    Depression    Mental retardation    Restless leg syndrome    Sleep apnea    Past Surgical History:  Procedure Laterality Date   COLONOSCOPY     COLONOSCOPY WITH PROPOFOL N/A 02/08/2018   Procedure: COLONOSCOPY WITH PROPOFOL;  Surgeon: Earline Mayotte, MD;  Location: [facility] ENDOSCOPY;  Service: General;  Laterality: N/A;   diviated septum     NOSE SURGERY     Social History   Socioeconomic History  Marital status: Single    Spouse name: Not on file   Number of children: Not on file   Years of education: Not on file   Highest education level: Not on file  Occupational History   Not on file  Tobacco Use   Smoking status: Never   Smokeless tobacco: Never  Vaping Use   Vaping status: Never Used  Substance and Sexual Activity   Alcohol use: No   Drug use: No   Sexual activity: Not on file  Other Topics Concern   Not on file  Social History Narrative   ** Merged History Encounter **       Social Determinants of Health   Financial Resource Strain: Low Risk  (01/13/2021)   Overall Financial Resource Strain (CARDIA)    Difficulty of Paying Living Expenses: Not hard at all  Food Insecurity: No Food Insecurity (01/13/2021)   Hunger Vital Sign    Worried About Running Out of Food in the Last Year: Never true    Ran Out of Food in the Last Year: Never true  Transportation Needs: No Transportation Needs (01/13/2021)   PRAPARE - Doctor, general practice (Medical): No    Lack of Transportation (Non-Medical): No  Physical Activity: Sufficiently Active (01/13/2021)   Exercise Vital Sign    Days of Exercise per Week: 5 days    Minutes of Exercise per Session: 30 min  Stress: No Stress Concern Present (01/13/2021)   [employer] of Occupational Health - Occupational Stress Questionnaire    Feeling of Stress : Not at all  Social Connections: Not on file  Intimate Partner Violence: Not on file   Family History  Problem Relation Age of Onset   Hypertension Mother    Hyperlipidemia Mother    Anxiety disorder Mother    Thyroid disease Mother    Current Outpatient Medications on File Prior to Visit  Medication Sig   ARIPiprazole (ABILIFY) 5 MG tablet TAKE 1 TABLET BY MOUTH EVERY DAY   b complex vitamins tablet Take 1 tablet by mouth daily.   magnesium oxide (MAG-OX) 400 MG tablet Take 400 mg by mouth daily.   Probiotic Product (PROBIOTIC DAILY PO) Take by mouth.   Calcium Polycarbophil (FIBER-CAPS PO) Take by mouth. (Patient not taking: Reported on 06/08/2023)   Cholecalciferol (VITAMIN D3 PO) Take by mouth. (Patient not taking: Reported on 06/08/2023)   ciclopirox (LOPROX) 0.77 % cream For fungal infection - apply to feet BID until scale has resolved. Then, continue BID for one more week after that. Afterwards, continue weekly (Patient not taking: Reported on 06/08/2023)   diclofenac Sodium (VOLTAREN) 1 % GEL Apply 2 g topically 4 (four) times daily as needed (knee pain). (Patient not taking: Reported on 06/08/2023)   triamcinolone ointment (KENALOG) 0.1 % Apply 1 application topically 2 (two) times daily as needed. For up to 2 weeks.Avoid applying to face, groin, and axilla. Use as directed. Long-term use can cause thinning of the skin. (Patient not taking: Reported on 06/08/2023)   No current facility-administered medications on file prior to visit.    Review of Systems  Constitutional:  Negative for activity change,  appetite change, chills, diaphoresis, fatigue and fever.  HENT:  Negative for congestion and hearing loss.   Eyes:  Negative for visual disturbance.  Respiratory:  Negative for cough, chest tightness, shortness of breath and wheezing.   Cardiovascular:  Negative for chest pain, palpitations and leg swelling.  Gastrointestinal:  Negative for abdominal pain,  constipation, diarrhea, nausea and vomiting.  Genitourinary:  Negative for dysuria, frequency and hematuria.  Musculoskeletal:  Negative for arthralgias and neck pain.  Skin:  Negative for rash.  Neurological:  Negative for dizziness, weakness, light-headedness, numbness and headaches.  Hematological:  Negative for adenopathy.  Psychiatric/Behavioral:  Negative for behavioral problems, dysphoric mood and sleep disturbance.    Per HPI unless specifically indicated above     Objective:    BP 130/84   Pulse 78   Ht 6\' 4"  (1.93 m)   Wt (!) 349 lb (158.3 kg)   BMI 42.48 kg/m   Wt Readings from Last 3 Encounters:  06/08/23 (!) 349 lb (158.3 kg)  03/19/22 (!) 341 lb (154.7 kg)  12/24/21 (!) 333 lb (151 kg)    Physical Exam Vitals and nursing note reviewed.  Constitutional:      General: He is not in acute distress.    Appearance: He is well-developed. He is not diaphoretic.     Comments: Well-appearing, comfortable, cooperative  HENT:     Head: Normocephalic and atraumatic.  Eyes:     General:        Right eye: No discharge.        Left eye: No discharge.     Conjunctiva/sclera: Conjunctivae normal.     Pupils: Pupils are equal, round, and reactive to light.  Neck:     Thyroid: No thyromegaly.  Cardiovascular:     Rate and Rhythm: Normal rate and regular rhythm.     Pulses: Normal pulses.     Heart sounds: Normal heart sounds. No murmur heard. Pulmonary:     Effort: Pulmonary effort is normal. No respiratory distress.     Breath sounds: Normal breath sounds. No wheezing or rales.  Abdominal:     General: Bowel sounds  are normal. There is no distension.     Palpations: Abdomen is soft. There is no mass.     Tenderness: There is no abdominal tenderness.  Musculoskeletal:        General: No tenderness. Normal range of motion.     Cervical back: Normal range of motion and neck supple.     Right lower leg: No edema.     Left lower leg: No edema.     Comments: Upper / Lower Extremities: - Normal muscle tone, strength bilateral upper extremities 5/5, lower extremities 5/5  Lymphadenopathy:     Cervical: No cervical adenopathy.  Skin:    General: Skin is warm and dry.     Findings: No erythema or rash.  Neurological:     Mental Status: He is alert and oriented to person, place, and time.     Comments: Distal sensation intact to light touch all extremities  Psychiatric:        Mood and Affect: Mood normal.        Behavior: Behavior normal.        Thought Content: Thought content normal.     Comments: Well groomed, good eye contact, normal speech and thoughts     Results for orders placed or performed in visit on 02/03/23  CBC with Differential/Platelet  Result Value Ref Range   WBC 7.9 3.8 - 10.8 Thousand/uL   RBC 5.64 4.20 - 5.80 Million/uL   Hemoglobin 16.1 13.2 - 17.1 g/dL   HCT 16.1 (H) 09.6 - 04.5 %   MCV 89.7 80.0 - 100.0 fL   MCH 28.5 27.0 - 33.0 pg   MCHC 31.8 (L) 32.0 - 36.0 g/dL  RDW 13.7 11.0 - 15.0 %   Platelets 251 140 - 400 Thousand/uL   MPV 10.2 7.5 - 12.5 fL   Neutro Abs 3,460 1,500 - 7,800 cells/uL   Lymphs Abs 3,437 850 - 3,900 cells/uL   Absolute Monocytes 672 200 - 950 cells/uL   Eosinophils Absolute 284 15 - 500 cells/uL   Basophils Absolute 47 0 - 200 cells/uL   Neutrophils Relative % 43.8 %   Total Lymphocyte 43.5 %   Monocytes Relative 8.5 %   Eosinophils Relative 3.6 %   Basophils Relative 0.6 %  Comprehensive metabolic panel  Result Value Ref Range   Glucose, Bld 103 (H) 65 - 99 mg/dL   BUN 14 7 - 25 mg/dL   Creat 0.98 1.19 - 1.47 mg/dL   BUN/Creatinine  Ratio SEE NOTE: 6 - 22 (calc)   Sodium 142 135 - 146 mmol/L   Potassium 4.6 3.5 - 5.3 mmol/L   Chloride 107 98 - 110 mmol/L   CO2 27 20 - 32 mmol/L   Calcium 9.8 8.6 - 10.3 mg/dL   Total Protein 6.8 6.1 - 8.1 g/dL   Albumin 4.3 3.6 - 5.1 g/dL   Globulin 2.5 1.9 - 3.7 g/dL (calc)   AG Ratio 1.7 1.0 - 2.5 (calc)   Total Bilirubin 0.6 0.2 - 1.2 mg/dL   Alkaline phosphatase (APISO) 52 35 - 144 U/L   AST 13 10 - 35 U/L   ALT 15 9 - 46 U/L  Lipid panel  Result Value Ref Range   Cholesterol 172 <200 mg/dL   HDL 39 (L) > OR = 40 mg/dL   Triglycerides 829 (H) <150 mg/dL   LDL Cholesterol (Calc) 99 mg/dL (calc)   Total CHOL/HDL Ratio 4.4 <5.0 (calc)   Non-HDL Cholesterol (Calc) 133 (H) <130 mg/dL (calc)  PSA  Result Value Ref Range   PSA 0.29 < OR = 4.00 ng/mL  Hemoglobin A1c  Result Value Ref Range   Hgb A1c MFr Bld 5.8 (H) <5.7 % of total Hgb   Mean Plasma Glucose 120 mg/dL   eAG (mmol/L) 6.6 mmol/L  TSH  Result Value Ref Range   TSH 1.71 0.40 - 4.50 mIU/L      Assessment & Plan:   Problem List Items Addressed This Visit     Anxiety   Relevant Medications   LORazepam (ATIVAN) 0.5 MG tablet   Cognitive developmental delay   Gastroesophageal reflux disease   Relevant Medications   meclizine (ANTIVERT) 25 MG tablet   pantoprazole (PROTONIX) 40 MG tablet   Mild intellectual disability   Mixed hyperlipidemia   Morbid obesity (HCC)   Prediabetes   Recurrent major depressive disorder, in partial remission (HCC)    Depression in partial remission Continues on Abilify 5mg  daily On Lorazepam PRN      Relevant Medications   LORazepam (ATIVAN) 0.5 MG tablet   Other Visit Diagnoses     Annual physical exam    -  Primary   Dizziness       Relevant Medications   meclizine (ANTIVERT) 25 MG tablet   Chronic pain of left knee       Acute non-recurrent frontal sinusitis       Relevant Medications   azithromycin (ZITHROMAX) 250 MG tablet        Updated Health Maintenance  information Reviewed recent lab results with patient Encouraged improvement to lifestyle with diet and exercise Goal of weight loss   Prediabetes HbA1c of 5.8, indicating prediabetes. Recent weight  gain of 7-8 pounds likely contributing to elevated blood sugars. -Advise dietary modifications, focusing on limiting excess carbohydrates, starches, and sweets, and increasing intake of proteins and vegetables.  Upper Respiratory Symptoms Cough for about a week, worse in the evening, possibly related to exposure to cats. No signs of infection on examination. -Continue over-the-counter cough suppressants and consider an antihistamine for potential allergy-related symptoms. -Provide prescription for Azithromycin PRINTED Back up Plan to be filled only if symptoms do not improve over the weekend.  Medication Refills Current medications include Abilify, Pantoprazole, Ativan, and Meclizine. -Refill prescriptions for Ativan, Meclizine, and Pantoprazole.  General Health Maintenance Up to date on flu vaccine and colon cancer screening. Shingles vaccine available at the pharmacy. -Consider shingles vaccine at the pharmacy.         No orders of the defined types were placed in this encounter.   Meds ordered this encounter  Medications   meclizine (ANTIVERT) 25 MG tablet    Sig: Take 1 tablet (25 mg total) by mouth 3 (three) times daily as needed for dizziness.    Dispense:  90 tablet    Refill:  1   LORazepam (ATIVAN) 0.5 MG tablet    Sig: Take 0.5-1 tablets (0.25-0.5 mg total) by mouth daily as needed for anxiety.    Dispense:  30 tablet    Refill:  1   pantoprazole (PROTONIX) 40 MG tablet    Sig: Take 1 tablet (40 mg total) by mouth daily before breakfast. For stomach acid    Dispense:  90 tablet    Refill:  3   azithromycin (ZITHROMAX) 250 MG tablet    Sig: Take 2 tablets on day 1, then 1 tablet daily on days 2 through 5    Dispense:  6 tablet    Refill:  0     Follow up  plan: Return in about 1 year (around 06/07/2024) for 1 year fasting lab > 1 week later Annual Physical.  Saralyn Pilar, DO [facility] 06/08/2023, 9:35 AM

## 2023-06-08 NOTE — Patient Instructions (Addendum)
Thank you for coming to the office today.  Recent Labs    02/04/23 0838  HGBA1C 5.8*   Pre Diabetes  Goal to improve diet see below.  If worsening sinuses can take Azithromycin Zpak antibiotic  Start Azithromycin Z pak (antibiotic) 2 tabs day 1, then 1 tab x 4 days, complete entire course even if improved    DUE for FASTING BLOOD WORK (no food or drink after midnight before the lab appointment, only water or coffee without cream/sugar on the morning of)  SCHEDULE "Lab Only" visit in the morning at the clinic for lab draw in 1 YEAR  - Make sure Lab Only appointment is at about 1 week before your next appointment, so that results will be available  For Lab Results, once available within 2-3 days of blood draw, you can can log in to MyChart online to view your results and a brief explanation. Also, we can discuss results at next follow-up visit.   Please schedule a Follow-up Appointment to: Return in about 1 year (around 06/07/2024) for 1 year fasting lab > 1 week later Annual Physical.  If you have any other questions or concerns, please feel free to call the office or send a message through MyChart. You may also schedule an earlier appointment if necessary.  Additionally, you may be receiving a survey about your experience at our office within a few days to 1 week by e-mail or mail. We value your feedback.  Saralyn Pilar, DO [facility], [place]

## 2023-06-24 ENCOUNTER — Encounter: Payer: Medicare Other | Admitting: Family Medicine

## 2023-08-26 ENCOUNTER — Ambulatory Visit: Payer: Medicare Other

## 2023-08-26 DIAGNOSIS — Z0000 Encounter for general adult medical examination without abnormal findings: Secondary | ICD-10-CM

## 2023-08-26 NOTE — Patient Instructions (Addendum)
 [name] , Thank you for taking time to come for your Medicare Wellness Visit. I appreciate your ongoing commitment to your health goals. Please review the following plan we discussed and let me know if I can assist you in the future.   Referrals/Orders/Follow-Ups/Clinician Recommendations: NONE  This is a list of the screening recommended for you and due dates:  Health Maintenance  Topic Date Due   Zoster (Shingles) Vaccine (1 of 2) Never done   COVID-19 Vaccine (4 - 2024-25 season) 02/27/2023   Medicare Annual Wellness Visit  08/25/2024   DTaP/Tdap/Td vaccine (2 - Td or Tdap) 02/03/2028   Colon Cancer Screening  02/09/2028   Flu Shot  Completed   Hepatitis C Screening  Completed   HIV Screening  Completed   HPV Vaccine  Aged Out    Advanced directives: (ACP Link)Information on Advanced Care Planning can be found at [facility] Advance Health Care Directives Advance Health Care Directives ([email or web address]   Next Medicare Annual Wellness Visit scheduled for next year: Yes   08/31/24 @ 8:10 AM BY PHONE

## 2023-08-26 NOTE — Progress Notes (Signed)
 Subjective:   Mario Silva is a 58 y.o. who presents for a Medicare Wellness preventive visit.  Visit Complete: Virtual I connected with  [name] on 08/26/23 by a audio enabled telemedicine application and verified that I am speaking with the correct person using two identifiers.  Patient Location: Home  Provider Location: Office/Clinic  I discussed the limitations of evaluation and management by telemedicine. The patient expressed understanding and agreed to proceed.  Vital Signs: Because this visit was a virtual/telehealth visit, some criteria may be missing or patient reported. Any vitals not documented were not able to be obtained and vitals that have been documented are patient reported.  VideoDeclined- This patient declined Librarian, academic. Therefore the visit was completed with audio only.  AWV Questionnaire: No: Patient Medicare AWV questionnaire was not completed prior to this visit.  Cardiac Risk Factors include: advanced age (>53men, >44 women);sedentary lifestyle;male gender;dyslipidemia;obesity (BMI >30kg/m2)     Objective:    There were no vitals filed for this visit. There is no height or weight on file to calculate BMI.     08/26/2023    9:02 AM 01/13/2021    3:27 PM 12/11/2019   11:26 AM 06/25/2019    3:32 PM 02/08/2018    6:57 AM 12/07/2014    5:21 PM  Advanced Directives  Does Patient Have a Medical Advance Directive? No Yes No No Yes No  Type of Sales promotion account executive of [employer];Living will   Copy of Healthcare Power of Attorney in Chart?  No - copy requested   No - copy requested   Would patient like information on creating a medical advance directive? No - Patient declined     No - patient declined information    Current Medications (verified) Outpatient Encounter Medications as of 08/26/2023  Medication Sig   ARIPiprazole (ABILIFY) 5 MG tablet TAKE 1 TABLET BY MOUTH EVERY  DAY   b complex vitamins tablet Take 1 tablet by mouth daily.   Calcium Polycarbophil (FIBER-CAPS PO) Take by mouth.   Cholecalciferol (VITAMIN D3 PO) Take by mouth.   LORazepam (ATIVAN) 0.5 MG tablet Take 0.5-1 tablets (0.25-0.5 mg total) by mouth daily as needed for anxiety.   magnesium oxide (MAG-OX) 400 MG tablet Take 400 mg by mouth daily.   meclizine (ANTIVERT) 25 MG tablet Take 1 tablet (25 mg total) by mouth 3 (three) times daily as needed for dizziness.   pantoprazole (PROTONIX) 40 MG tablet Take 1 tablet (40 mg total) by mouth daily before breakfast. For stomach acid   Probiotic Product (PROBIOTIC DAILY PO) Take by mouth.   triamcinolone ointment (KENALOG) 0.1 % Apply 1 application topically 2 (two) times daily as needed. For up to 2 weeks.Avoid applying to face, groin, and axilla. Use as directed. Long-term use can cause thinning of the skin.   ciclopirox (LOPROX) 0.77 % cream For fungal infection - apply to feet BID until scale has resolved. Then, continue BID for one more week after that. Afterwards, continue weekly (Patient not taking: Reported on 08/26/2023)   diclofenac Sodium (VOLTAREN) 1 % GEL Apply 2 g topically 4 (four) times daily as needed (knee pain). (Patient not taking: Reported on 08/26/2023)   No facility-administered encounter medications on file as of 08/26/2023.    Allergies (verified) Penicillins, Latex, and Latex   History: Past Medical History:  Diagnosis Date   Acid reflux    Actinic keratosis 08/30/2019   right  eyebrow, bx proven   Anxiety    Depression    Mental retardation    Restless leg syndrome    Sleep apnea    Past Surgical History:  Procedure Laterality Date   COLONOSCOPY     COLONOSCOPY WITH PROPOFOL N/A 02/08/2018   Procedure: COLONOSCOPY WITH PROPOFOL;  Surgeon: Earline Mayotte, MD;  Location: [facility] ENDOSCOPY;  Service: General;  Laterality: N/A;   diviated septum     NOSE SURGERY     Family History  Problem Relation Age of Onset    Hypertension Mother    Hyperlipidemia Mother    Anxiety disorder Mother    Thyroid disease Mother    Social History   Socioeconomic History   Marital status: Single    Spouse name: Not on file   Number of children: Not on file   Years of education: Not on file   Highest education level: Not on file  Occupational History   Not on file  Tobacco Use   Smoking status: Never   Smokeless tobacco: Never  Vaping Use   Vaping status: Never Used  Substance and Sexual Activity   Alcohol use: No   Drug use: No   Sexual activity: Not on file  Other Topics Concern   Not on file  Social History Narrative   ** Merged History Encounter **       Social Drivers of Health   Financial Resource Strain: Low Risk  (08/26/2023)   Overall Financial Resource Strain (CARDIA)    Difficulty of Paying Living Expenses: Not hard at all  Food Insecurity: No Food Insecurity (08/26/2023)   Hunger Vital Sign    Worried About Running Out of Food in the Last Year: Never true    Ran Out of Food in the Last Year: Never true  Transportation Needs: No Transportation Needs (08/26/2023)   PRAPARE - Administrator, Civil Service (Medical): No    Lack of Transportation (Non-Medical): No  Physical Activity: Insufficiently Active (08/26/2023)   Exercise Vital Sign    Days of Exercise per Week: 5 days    Minutes of Exercise per Session: 20 min  Stress: No Stress Concern Present (08/26/2023)   [employer] of Occupational Health - Occupational Stress Questionnaire    Feeling of Stress : Not at all  Social Connections: Moderately Isolated (08/26/2023)   Social Connection and Isolation Panel [NHANES]    Frequency of Communication with Friends and Family: More than three times a week    Frequency of Social Gatherings with Friends and Family: More than three times a week    Attends Religious Services: More than 4 times per year    Active Member of [employer] or Organizations: No    Attends Hospital doctor: Never    Marital Status: Never married    Tobacco Counseling Counseling given: Not Answered    Clinical Intake:  Pre-visit preparation completed: Yes  Pain : No/denies pain     BMI - recorded: 42.5 Nutritional Status: BMI > 30  Obese Nutritional Risks: None Diabetes: No  How often do you need to have someone help you when you read instructions, pamphlets, or other written materials from your doctor or pharmacy?: 1 - Never  Interpreter Needed?: No  Information entered by :: Kennedy Bucker, LPN   Activities of Daily Living     08/26/2023    9:04 AM  In your present state of health, do you have any difficulty performing the following activities:  Hearing? 0  Vision? 0  Difficulty concentrating or making decisions? 1  Walking or climbing stairs? 0  Dressing or bathing? 0  Doing errands, shopping? 1  Preparing Food and eating ? N  Using the Toilet? N  In the past six months, have you accidently leaked urine? N  Do you have problems with loss of bowel control? N  Managing your Medications? N  Managing your Finances? N  Housekeeping or managing your Housekeeping? N    Patient Care Team: Smitty Cords, DO as PCP - General (Family Medicine) Lyndon Code, MD (Internal Medicine) Carlean Jews, NP as Nurse Practitioner (Family Medicine) Lemar Livings Merrily Pew, MD (General Surgery) Pa, [facility])  Indicate any recent Medical Services you may have received from other than [facility] providers in the past year (date may be approximate).     Assessment:   This is a routine wellness examination for [facility].  Hearing/Vision screen Hearing Screening - Comments:: NO AIDS Vision Screening - Comments:: WEARS GLASSES ALL THE TIME- [facility]   Goals Addressed             This Visit's Progress    DIET - EAT MORE FRUITS AND VEGETABLES         Depression Screen     08/26/2023    9:00 AM 08/26/2023    8:59 AM 06/08/2023    9:17 AM  03/19/2022    2:34 PM 12/24/2021    9:05 AM 09/22/2021    8:47 AM 01/13/2021    3:29 PM  PHQ 2/9 Scores  PHQ - 2 Score 3 0 0 0 0 0 0  PHQ- 9 Score 3 0  0 0 0     Fall Risk     08/26/2023    9:03 AM 06/08/2023    9:17 AM 03/19/2022    2:34 PM 12/24/2021   10:18 AM 09/22/2021    8:47 AM  Fall Risk   Falls in the past year? 1 0 1 0 0  Number falls in past yr: 0  0 0 0  Injury with Fall? 0  0 0 0  Risk for fall due to :   No Fall Risks No Fall Risks No Fall Risks  Follow up Falls evaluation completed;Falls prevention discussed   Falls evaluation completed Falls evaluation completed    MEDICARE RISK AT HOME:  Medicare Risk at Home Any stairs in or around the home?: Yes If so, are there any without handrails?: No Home free of loose throw rugs in walkways, pet beds, electrical cords, etc?: Yes Adequate lighting in your home to reduce risk of falls?: Yes Life alert?: No Use of a cane, walker or w/c?: Yes (CANE  W/ GOUT FLARES) Grab bars in the bathroom?: No Shower chair or bench in shower?: No Elevated toilet seat or a handicapped toilet?: Yes  TIMED UP AND GO:  Was the test performed?  No  Cognitive Function: 6CIT completed        08/26/2023    9:06 AM  6CIT Screen  What Year? 0 points  What month? 0 points  What time? 0 points  Count back from 20 4 points  Months in reverse 0 points  Repeat phrase 8 points  Total Score 12 points    Immunizations Immunization History  Administered Date(s) Administered   Influenza,inj,Quad PF,6+ Mos 02/23/2019, 04/27/2021, 03/19/2022   Influenza-Unspecified 04/14/2023   PFIZER(Purple Top)SARS-COV-2 Vaccination 08/27/2019, 09/17/2019, 04/24/2020   Tdap 02/02/2018    Screening Tests  Health Maintenance  Topic Date Due   Zoster Vaccines- Shingrix (1 of 2) Never done   COVID-19 Vaccine (4 - 2024-25 season) 02/27/2023   Medicare Annual Wellness (AWV)  08/25/2024   DTaP/Tdap/Td (2 - Td or Tdap) 02/03/2028   Colonoscopy  02/09/2028    INFLUENZA VACCINE  Completed   Hepatitis C Screening  Completed   HIV Screening  Completed   HPV VACCINES  Aged Out    Health Maintenance  Health Maintenance Due  Topic Date Due   Zoster Vaccines- Shingrix (1 of 2) Never done   COVID-19 Vaccine (4 - 2024-25 season) 02/27/2023   Health Maintenance Items Addressed: UP TO DATE W/ COLONOSCOPY  Additional Screening:  Vision Screening: Recommended annual ophthalmology exams for early detection of glaucoma and other disorders of the eye.  Dental Screening: Recommended annual dental exams for proper oral hygiene  Community Resource Referral / Chronic Care Management: CRR required this visit?  No   CCM required this visit?  No     Plan:     I have personally reviewed and noted the following in the patient's chart:   Medical and social history Use of alcohol, tobacco or illicit drugs  Current medications and supplements including opioid prescriptions. Patient is not currently taking opioid prescriptions. Functional ability and status Nutritional status Physical activity Advanced directives List of other physicians Hospitalizations, surgeries, and ER visits in previous 12 months Vitals Screenings to include cognitive, depression, and falls Referrals and appointments  In addition, I have reviewed and discussed with patient certain preventive protocols, quality metrics, and best practice recommendations. A written personalized care plan for preventive services as well as general preventive health recommendations were provided to patient.     Hal Hope, LPN   03/11/7828   After Visit Summary: (MyChart) Due to this being a telephonic visit, the after visit summary with patients personalized plan was offered to patient via MyChart   Notes: Nothing significant to report at this time.

## 2023-09-02 ENCOUNTER — Encounter: Payer: Self-pay | Admitting: Podiatry

## 2023-09-02 ENCOUNTER — Ambulatory Visit (INDEPENDENT_AMBULATORY_CARE_PROVIDER_SITE_OTHER): Payer: Medicare Other | Admitting: Podiatry

## 2023-09-02 VITALS — Ht 76.0 in | Wt 349.0 lb

## 2023-09-02 DIAGNOSIS — M79672 Pain in left foot: Secondary | ICD-10-CM | POA: Diagnosis not present

## 2023-09-02 DIAGNOSIS — M79674 Pain in right toe(s): Secondary | ICD-10-CM

## 2023-09-02 DIAGNOSIS — M79671 Pain in right foot: Secondary | ICD-10-CM

## 2023-09-02 DIAGNOSIS — L84 Corns and callosities: Secondary | ICD-10-CM | POA: Diagnosis not present

## 2023-09-02 DIAGNOSIS — M79675 Pain in left toe(s): Secondary | ICD-10-CM

## 2023-09-02 DIAGNOSIS — B351 Tinea unguium: Secondary | ICD-10-CM

## 2023-09-02 NOTE — Progress Notes (Unsigned)
  Subjective:  Patient ID: Mario Silva, male    DOB: 17-Jul-1965,  MRN: [id number]  58 y.o. male presents to clinic with  {jgcomplaint:23593}  Chief Complaint  Patient presents with   Nail Problem    Pt is here for [facility] PCP is Dr Althea Charon and LOV was in December.     New problem(s): None   PCP is Smitty Cords, DO.  Allergies  Allergen Reactions   Penicillins    Latex Other (See Comments) and Rash    blister   Latex Rash    Review of Systems: Negative except as noted in the HPI.   Objective:  Mario Silva is a pleasant 58 y.o. male morbidly obese in NAD. AAO x 3.  Vascular Examination: Vascular status intact b/l with palpable pedal pulses. CFT immediate b/l. No edema. No pain with calf compression b/l. Skin temperature gradient WNL b/l. No ischemia or gangrene noted b/l LE. No cyanosis or clubbing noted b/l LE.  Neurological Examination: Sensation grossly intact b/l with 10 gram monofilament. Vibratory sensation intact b/l.   Dermatological Examination: Pedal skin with normal turgor, texture and tone b/l. Toenails 1-5 b/l thick, discolored, elongated with subungual debris and pain on dorsal palpation. Hyperkeratotic lesion(s) bilateral great toes, submet head 1 right foot, and right fourth digit.  No erythema, no edema, no drainage, no fluctuance.   Musculoskeletal Examination: Muscle strength 5/5 to b/l LE. Hallux hammertoe noted b/l LE. Hammertoe deformity noted 2-5 b/l.  Radiographs: None  Last A1c:      Latest Ref Rng & Units 02/04/2023    8:38 AM  Hemoglobin A1C  Hemoglobin-A1c <5.7 % of total Hgb 5.8      Assessment:   1. Pain due to onychomycosis of toenails of both feet   2. Corns and callosities   3. Pain in both feet    Plan:  -Consent given for treatment as described below: -Examined patient. -Mycotic toenails 1-5 bilaterally were debrided in length and girth with sterile nail nippers and dremel without incident. -Corn(s)  {jgPodToeLocator:23637} pared utilizing sterile scalpel blade without complication or incident. Total number debrided=***. -Callus(es) {jgPodToeLocator:23637} pared utilizing sterile scalpel blade without complication or incident. Total number debrided =***. -Patient/POA to call should there be question/concern in the interim.  Return in about 3 months (around 12/03/2023).  Freddie Breech, DPM      [place] LOCATION: [address], [place] [address]                   Office [phone]   [facility] LOCATION: [address] [place], [place] [address] Office [phone]

## 2023-11-11 DIAGNOSIS — G4733 Obstructive sleep apnea (adult) (pediatric): Secondary | ICD-10-CM | POA: Diagnosis not present

## 2023-11-23 ENCOUNTER — Other Ambulatory Visit: Payer: Self-pay | Admitting: Family Medicine

## 2023-11-23 DIAGNOSIS — F3341 Major depressive disorder, recurrent, in partial remission: Secondary | ICD-10-CM

## 2023-11-25 NOTE — Telephone Encounter (Signed)
 Requested medications are due for refill today.  yes  Requested medications are on the active medications list.  yes  Last refill. 11/16/2022 #90 3 rf  Future visit scheduled.   yes  Notes to clinic.  Refill not delegated.    Requested Prescriptions  Pending Prescriptions Disp Refills   ARIPiprazole  (ABILIFY ) 5 MG tablet [Pharmacy Med Name: ARIPIPRAZOLE  5 MG TABLET] 90 tablet 3    Sig: TAKE 1 TABLET BY MOUTH EVERY DAY     Not Delegated - Psychiatry:  Antipsychotics - Second Generation (Atypical) - aripiprazole  Failed - 11/25/2023  1:39 PM      Failed - This refill cannot be delegated      Failed - Lipid Panel in normal range within the last 12 months    Cholesterol  Date Value Ref Range Status  02/04/2023 172 <200 mg/dL Final   LDL Cholesterol (Calc)  Date Value Ref Range Status  02/04/2023 99 mg/dL (calc) Final    Comment:    Reference range: <100 . Desirable range <100 mg/dL for primary prevention;   <70 mg/dL for patients with CHD or diabetic patients  with > or = 2 CHD risk factors. Aaron Aas LDL-C is now calculated using the Martin-Hopkins  calculation, which is a validated novel method providing  better accuracy than the Friedewald equation in the  estimation of LDL-C.  Melinda Sprawls et al. Erroll Heard. 1610;960(45): 2061-2068  (http://education.[facility].com/faq/FAQ164)    HDL  Date Value Ref Range Status  02/04/2023 39 (L) > OR = 40 mg/dL Final   Triglycerides  Date Value Ref Range Status  02/04/2023 219 (H) <150 mg/dL Final    Comment:    . If a non-fasting specimen was collected, consider repeat triglyceride testing on a fasting specimen if clinically indicated.  Imagene Mam et al. J. of Clin. Lipidol. 2015;9:129-169. Aaron Aas          Passed - TSH in normal range and within 360 days    TSH  Date Value Ref Range Status  02/04/2023 1.71 0.40 - 4.50 mIU/L Final         Passed - Completed PHQ-2 or PHQ-9 in the last 360 days      Passed - Last BP in normal range     BP Readings from Last 1 Encounters:  06/08/23 130/84         Passed - Last Heart Rate in normal range    Pulse Readings from Last 1 Encounters:  06/08/23 78         Passed - Valid encounter within last 6 months    Recent Outpatient Visits   None     Future Appointments             In 6 months Karamalegos, Kayleen Party, DO [facility]            Passed - CBC within normal limits and completed in the last 12 months    WBC  Date Value Ref Range Status  02/04/2023 7.9 3.8 - 10.8 Thousand/uL Final   RBC  Date Value Ref Range Status  02/04/2023 5.64 4.20 - 5.80 Million/uL Final   Hemoglobin  Date Value Ref Range Status  02/04/2023 16.1 13.2 - 17.1 g/dL Final   HGB  Date Value Ref Range Status  07/08/2013 14.9 13.0 - 18.0 g/dL Final   HCT  Date Value Ref Range Status  02/04/2023 50.6 (H) 38.5 - 50.0 % Final  07/08/2013 44.2 40.0 - 52.0 % Final  MCHC  Date Value Ref Range Status  02/04/2023 31.8 (L) 32.0 - 36.0 g/dL Final   [facility]  Date Value Ref Range Status  02/04/2023 28.5 27.0 - 33.0 pg Final   MCV  Date Value Ref Range Status  02/04/2023 89.7 80.0 - 100.0 fL Final  07/08/2013 87 80 - 100 fL Final   No results found for: "PLTCOUNTKUC", "LABPLAT", "POCPLA" RDW  Date Value Ref Range Status  02/04/2023 13.7 11.0 - 15.0 % Final  07/08/2013 14.1 11.5 - 14.5 % Final         Passed - CMP within normal limits and completed in the last 12 months    Albumin  Date Value Ref Range Status  12/07/2014 4.3 3.5 - 5.0 g/dL Final   Alkaline Phosphatase  Date Value Ref Range Status  12/07/2014 45 38 - 126 U/L Final   Alkaline phosphatase (APISO)  Date Value Ref Range Status  02/04/2023 52 35 - 144 U/L Final   ALT  Date Value Ref Range Status  02/04/2023 15 9 - 46 U/L Final   AST  Date Value Ref Range Status  02/04/2023 13 10 - 35 U/L Final   BUN  Date Value Ref Range Status  02/04/2023 14 7 - 25 mg/dL Final  25/95/6387  13 7 - 18 mg/dL Final   Calcium  Date Value Ref Range Status  02/04/2023 9.8 8.6 - 10.3 mg/dL Final   Calcium, Total  Date Value Ref Range Status  07/08/2013 9.1 8.5 - 10.1 mg/dL Final   CO2  Date Value Ref Range Status  02/04/2023 27 20 - 32 mmol/L Final   Co2  Date Value Ref Range Status  07/08/2013 27 21 - 32 mmol/L Final   Creat  Date Value Ref Range Status  02/04/2023 0.85 0.70 - 1.30 mg/dL Final   Glucose  Date Value Ref Range Status  07/08/2013 105 (H) 65 - 99 mg/dL Final   Glucose, Bld  Date Value Ref Range Status  02/04/2023 103 (H) 65 - 99 mg/dL Final    Comment:    .            Fasting reference interval . For someone without known diabetes, a glucose value between 100 and 125 mg/dL is consistent with prediabetes and should be confirmed with a follow-up test. .    Potassium  Date Value Ref Range Status  02/04/2023 4.6 3.5 - 5.3 mmol/L Final  07/08/2013 3.7 3.5 - 5.1 mmol/L Final   Sodium  Date Value Ref Range Status  02/04/2023 142 135 - 146 mmol/L Final  07/08/2013 140 136 - 145 mmol/L Final   Total Bilirubin  Date Value Ref Range Status  02/04/2023 0.6 0.2 - 1.2 mg/dL Final   Protein, ur  Date Value Ref Range Status  12/07/2014 NEGATIVE NEGATIVE mg/dL Final   Total Protein  Date Value Ref Range Status  02/04/2023 6.8 6.1 - 8.1 g/dL Final   GFR, Est African American  Date Value Ref Range Status  09/12/2019 117 > OR = 60 mL/min/1.92m2 Final   eGFR  Date Value Ref Range Status  12/17/2021 101 > OR = 60 mL/min/1.41m2 Final    Comment:    The eGFR is based on the CKD-EPI 2021 equation. To calculate  the new eGFR from a previous Creatinine or Cystatin C result, go to https://www.kidney.org/professionals/ kdoqi/gfr%5Fcalculator    GFR, Est Non African American  Date Value Ref Range Status  09/12/2019 101 > OR = 60 mL/min/1.40m2 Final

## 2023-12-09 ENCOUNTER — Ambulatory Visit (INDEPENDENT_AMBULATORY_CARE_PROVIDER_SITE_OTHER): Admitting: Podiatry

## 2023-12-09 DIAGNOSIS — B351 Tinea unguium: Secondary | ICD-10-CM | POA: Diagnosis not present

## 2023-12-09 DIAGNOSIS — M79675 Pain in left toe(s): Secondary | ICD-10-CM

## 2023-12-09 DIAGNOSIS — M79674 Pain in right toe(s): Secondary | ICD-10-CM | POA: Diagnosis not present

## 2023-12-15 ENCOUNTER — Encounter: Payer: Self-pay | Admitting: Podiatry

## 2023-12-15 NOTE — Progress Notes (Signed)
  Subjective:  Patient ID: Mario Silva, male    DOB: April 23, 1966,  MRN: [id number]  Mario Silva presents to clinic today for callus(es) of both feet and painful mycotic toenails that are difficult to trim. Painful toenails interfere with ambulation. Aggravating factors include wearing enclosed shoe gear. Pain is relieved with periodic professional debridement. Painful calluses are aggravated when weightbearing with and without shoegear. Pain is relieved with periodic professional debridement.   He is a resident of [facility].  New problem(s): None.   PCP is Raina Bunting, DO. LOV 06/08/2023.  Allergies  Allergen Reactions   Penicillins    Latex Other (See Comments) and Rash    blister   Latex Rash    Review of Systems: Negative except as noted in the HPI.  Objective: No changes noted in today's physical examination. There were no vitals filed for this visit. Mario Silva is a pleasant 58 y.o. male obese in NAD. AAO x 3.  Vascular Examination: Capillary refill time immediate b/l. Palpable pedal pulses. Pedal hair present b/l. No pain with calf compression b/l. Skin temperature gradient WNL b/l. No cyanosis or clubbing b/l. No ischemia or gangrene noted b/l. No edema noted b/l LE.  Neurological Examination: Sensation grossly intact b/l with 10 gram monofilament. Vibratory sensation intact b/l.   Dermatological Examination: Pedal skin with normal turgor, texture and tone b/l.  No open wounds. No interdigital macerations.   Toenails 1-5 b/l thick, discolored, elongated with subungual debris and pain on dorsal palpation.   Hyperkeratotic lesion(s) medial IPJ of right great toe and plantar IPJ of left great toe.  No erythema, no edema, no drainage, no fluctuance.  Musculoskeletal Examination: Muscle strength 5/5 to all lower extremity muscle groups bilaterally. Hammertoe deformity noted 2-5 b/l.Mario Silva No pain, crepitus or joint limitation noted with ROM b/l  LE.  Patient ambulates independently without assistive aids.  Radiographs: None  Last A1c:      Latest Ref Rng & Units 02/04/2023    8:38 AM  Hemoglobin A1C  Hemoglobin-A1c <5.7 % of total Hgb 5.8    Assessment/Plan: 1. Pain due to onychomycosis of toenails of both feet   Patient was evaluated and treated. All patient's and/or POA's questions/concerns addressed on today's visit. Toenails 1-5 debrided in length and girth without incident. Continue soft, supportive shoe gear daily. Report any pedal injuries to medical professional. Call office if there are any questions/concerns. -Will submit preauthorization to insurance company for paring of calluses IPJ of bilateral great toes. -As a courtesy, callus(es) medial IPJ of left great toe and medial IPJ of right great toe pared utilizing sterile scalpel blade without complication or incident. Total number pared=2. -Patient/POA to call should there be question/concern in the interim.   Return in about 3 months (around 03/10/2024).  Mario Silva, DPM      [place] LOCATION: [address], [place] [address]                   Office [phone]   [facility] LOCATION: [address] [place], [place] [address] Office [phone]

## 2024-01-18 DIAGNOSIS — H93293 Other abnormal auditory perceptions, bilateral: Secondary | ICD-10-CM | POA: Diagnosis not present

## 2024-01-18 DIAGNOSIS — H6123 Impacted cerumen, bilateral: Secondary | ICD-10-CM | POA: Diagnosis not present

## 2024-02-07 ENCOUNTER — Encounter: Payer: Self-pay | Admitting: Dermatology

## 2024-02-07 ENCOUNTER — Ambulatory Visit: Payer: Medicare Other | Admitting: Dermatology

## 2024-02-07 ENCOUNTER — Encounter: Payer: Medicare Other | Admitting: Dermatology

## 2024-02-07 DIAGNOSIS — L570 Actinic keratosis: Secondary | ICD-10-CM | POA: Diagnosis not present

## 2024-02-07 DIAGNOSIS — L814 Other melanin hyperpigmentation: Secondary | ICD-10-CM | POA: Diagnosis not present

## 2024-02-07 DIAGNOSIS — B353 Tinea pedis: Secondary | ICD-10-CM

## 2024-02-07 DIAGNOSIS — L821 Other seborrheic keratosis: Secondary | ICD-10-CM | POA: Diagnosis not present

## 2024-02-07 DIAGNOSIS — I872 Venous insufficiency (chronic) (peripheral): Secondary | ICD-10-CM

## 2024-02-07 DIAGNOSIS — W908XXA Exposure to other nonionizing radiation, initial encounter: Secondary | ICD-10-CM

## 2024-02-07 DIAGNOSIS — B351 Tinea unguium: Secondary | ICD-10-CM

## 2024-02-07 DIAGNOSIS — D1801 Hemangioma of skin and subcutaneous tissue: Secondary | ICD-10-CM

## 2024-02-07 DIAGNOSIS — D229 Melanocytic nevi, unspecified: Secondary | ICD-10-CM

## 2024-02-07 DIAGNOSIS — L578 Other skin changes due to chronic exposure to nonionizing radiation: Secondary | ICD-10-CM | POA: Diagnosis not present

## 2024-02-07 DIAGNOSIS — D239 Other benign neoplasm of skin, unspecified: Secondary | ICD-10-CM

## 2024-02-07 DIAGNOSIS — Z1283 Encounter for screening for malignant neoplasm of skin: Secondary | ICD-10-CM

## 2024-02-07 NOTE — Patient Instructions (Signed)

## 2024-02-07 NOTE — Progress Notes (Signed)
 Follow-Up Visit   Subjective  Mario Silva is a 58 y.o. male who presents for the following: Skin Cancer Screening and Full Body Skin Exam  The patient presents for Total-Body Skin Exam (TBSE) for skin cancer screening and mole check. The patient has spots, moles and lesions to be evaluated, some may be new or changing and the patient may have concern these could be cancer.  The following portions of the chart were reviewed this encounter and updated as appropriate: medications, allergies, medical history  Review of Systems:  No other skin or systemic complaints except as noted in HPI or Assessment and Plan.  Objective  Well appearing patient in no apparent distress; mood and affect are within normal limits.  A full examination was performed including scalp, head, eyes, ears, nose, lips, neck, chest, axillae, abdomen, back, buttocks, bilateral upper extremities, bilateral lower extremities, hands, feet, fingers, toes, fingernails, and toenails. All findings within normal limits unless otherwise noted below.   Relevant physical exam findings are noted in the Assessment and Plan.  right brow Erythematous thin papules/macules with gritty scale.   Assessment & Plan   SKIN CANCER SCREENING PERFORMED TODAY.  ACTINIC DAMAGE - Chronic condition, secondary to cumulative UV/sun exposure - diffuse scaly erythematous macules with underlying dyspigmentation - Recommend daily broad spectrum sunscreen SPF 30+ to sun-exposed areas, reapply every 2 hours as needed.  - Staying in the shade or wearing long sleeves, sun glasses (UVA+UVB protection) and wide brim hats (4-inch brim around the entire circumference of the hat) are also recommended for sun protection.  - Call for new or changing lesions.  LENTIGINES, SEBORRHEIC KERATOSES, HEMANGIOMAS - Benign normal skin lesions - Benign-appearing - Call for any changes  MELANOCYTIC NEVI - Tan-brown and/or pink-flesh-colored symmetric macules and  papules - Benign appearing on exam today - Observation - Call clinic for new or changing moles - Recommend daily use of broad spectrum spf 30+ sunscreen to sun-exposed areas.   DERMATOFIBROMA Exam: Firm pink/brown papulenodule with dimple sign at left anterior and left posterior thigh. Treatment Plan: A dermatofibroma is a benign growth possibly related to trauma, such as an insect bite, cut from shaving, or inflamed acne-type bump.  Treatment options to remove include shave or excision with resulting scar and risk of recurrence.  Since benign-appearing and not bothersome, will observe for now.   STASIS DERMATITIS Exam: Erythematous, scaly patches involving the ankle and distal lower leg with associated lower leg edema.  Stasis in the legs causes chronic leg swelling, which may result in itchy or painful rashes, skin discoloration, skin texture changes, and sometimes ulceration.  Recommend daily graduated compression hose/stockings- easiest to put on first thing in morning, remove at bedtime.  Elevate legs as much as possible. Avoid salt/sodium rich foods.  ONYCHOMYCOSIS Exam: Thickened toenails with subungal debris c/w onychomycosis  Chronic and persistent condition with duration or expected duration over one year. Condition is symptomatic/ bothersome to patient. Not currently at goal.  Treatment Plan: None, not bothersome to patient.  AK (ACTINIC KERATOSIS) right brow Actinic keratoses are precancerous spots that appear secondary to cumulative UV radiation exposure/sun exposure over time. They are chronic with expected duration over 1 year. A portion of actinic keratoses will progress to squamous cell carcinoma of the skin. It is not possible to reliably predict which spots will progress to skin cancer and so treatment is recommended to prevent development of skin cancer.  Recommend daily broad spectrum sunscreen SPF 30+ to sun-exposed areas, reapply every  2 hours as needed.  Recommend  staying in the shade or wearing long sleeves, sun glasses (UVA+UVB protection) and wide brim hats (4-inch brim around the entire circumference of the hat). Call for new or changing lesions.  Patient and patient's caretaker will discuss with patient's sister about treatment, options are to treat with LN2, biopsy or treat topically.  Destruction of lesion - right brow Complexity: simple   Destruction method: cryotherapy   Informed consent: discussed and consent obtained   Timeout:  patient name, date of birth, surgical site, and procedure verified Lesion destroyed using liquid nitrogen: Yes   Region frozen until ice ball extended beyond lesion: Yes   Outcome: patient tolerated procedure well with no complications   Post-procedure details: wound care instructions given    MULTIPLE BENIGN NEVI   LENTIGINES   ACTINIC ELASTOSIS   SEBORRHEIC KERATOSES   CHERRY ANGIOMA   ONYCHOMYCOSIS   TINEA PEDIS OF BOTH FEET   VENOUS STASIS DERMATITIS OF BOTH LOWER EXTREMITIES   DERMATOFIBROMA    Return in about 1 year (around 02/06/2025) for TBSE - Hx AK.  LILLETTE Rosina Mayans, CMA, am acting as scribe for Boneta Sharps, MD .   Documentation: I have reviewed the above documentation for accuracy and completeness, and I agree with the above.  Boneta Sharps, MD

## 2024-03-16 ENCOUNTER — Ambulatory Visit: Admitting: Podiatry

## 2024-03-16 DIAGNOSIS — Z91198 Patient's noncompliance with other medical treatment and regimen for other reason: Secondary | ICD-10-CM

## 2024-03-16 NOTE — Progress Notes (Signed)
 1. Failure to attend appointment with reason given    Appointment rescheduled by [facility].

## 2024-04-03 DIAGNOSIS — B353 Tinea pedis: Secondary | ICD-10-CM | POA: Diagnosis not present

## 2024-04-03 DIAGNOSIS — M79674 Pain in right toe(s): Secondary | ICD-10-CM | POA: Diagnosis not present

## 2024-04-03 DIAGNOSIS — B351 Tinea unguium: Secondary | ICD-10-CM | POA: Diagnosis not present

## 2024-04-03 DIAGNOSIS — M79675 Pain in left toe(s): Secondary | ICD-10-CM | POA: Diagnosis not present

## 2024-04-03 DIAGNOSIS — L600 Ingrowing nail: Secondary | ICD-10-CM | POA: Diagnosis not present

## 2024-04-19 ENCOUNTER — Ambulatory Visit: Admitting: Podiatry

## 2024-05-07 ENCOUNTER — Ambulatory Visit: Admitting: Podiatry

## 2024-06-06 ENCOUNTER — Other Ambulatory Visit: Payer: Self-pay | Admitting: Family Medicine

## 2024-06-06 DIAGNOSIS — R7303 Prediabetes: Secondary | ICD-10-CM

## 2024-06-06 DIAGNOSIS — F419 Anxiety disorder, unspecified: Secondary | ICD-10-CM

## 2024-06-06 DIAGNOSIS — N400 Enlarged prostate without lower urinary tract symptoms: Secondary | ICD-10-CM

## 2024-06-06 DIAGNOSIS — Z0000 Encounter for general adult medical examination without abnormal findings: Secondary | ICD-10-CM

## 2024-06-06 DIAGNOSIS — E782 Mixed hyperlipidemia: Secondary | ICD-10-CM

## 2024-06-06 DIAGNOSIS — F70 Mild intellectual disabilities: Secondary | ICD-10-CM

## 2024-06-07 ENCOUNTER — Other Ambulatory Visit: Payer: Self-pay

## 2024-06-14 ENCOUNTER — Encounter: Payer: Self-pay | Admitting: Family Medicine

## 2024-06-15 ENCOUNTER — Other Ambulatory Visit: Payer: Self-pay | Admitting: Family Medicine

## 2024-06-15 DIAGNOSIS — K219 Gastro-esophageal reflux disease without esophagitis: Secondary | ICD-10-CM

## 2024-06-19 NOTE — Telephone Encounter (Signed)
 Requested medication (s) are due for refill today - yes  Requested medication (s) are on the active medication list -yes  Future visit scheduled -no  Last refill: 06/08/23  Notes to clinic: Last OV 06/08/23- multiple canceled- fails visit protocol  Requested Prescriptions  Pending Prescriptions Disp Refills   pantoprazole  (PROTONIX ) 40 MG tablet [Pharmacy Med Name: PANTOPRAZOLE  SOD DR 40 MG TAB] 90 tablet 3    Sig: Take 1 tablet (40 mg total) by mouth daily before breakfast. For stomach acid     Gastroenterology: Proton Pump Inhibitors Passed - 06/19/2024  9:56 AM      Passed - Valid encounter within last 12 months    Recent Outpatient Visits   None     Future Appointments             In 7 months Claudene Lehmann, MD [facility]               Requested Prescriptions  Pending Prescriptions Disp Refills   pantoprazole  (PROTONIX ) 40 MG tablet [Pharmacy Med Name: PANTOPRAZOLE  SOD DR 40 MG TAB] 90 tablet 3    Sig: Take 1 tablet (40 mg total) by mouth daily before breakfast. For stomach acid     Gastroenterology: Proton Pump Inhibitors Passed - 06/19/2024  9:56 AM      Passed - Valid encounter within last 12 months    Recent Outpatient Visits   None     Future Appointments             In 7 months Claudene Lehmann, MD [facility]

## 2024-07-19 ENCOUNTER — Encounter: Payer: Self-pay | Admitting: Family Medicine

## 2024-07-19 ENCOUNTER — Ambulatory Visit: Admitting: Family Medicine

## 2024-07-19 VITALS — BP 130/82 | HR 90 | Temp 98.1°F | Ht 76.0 in | Wt 346.3 lb

## 2024-07-19 DIAGNOSIS — S80812A Abrasion, left lower leg, initial encounter: Secondary | ICD-10-CM

## 2024-07-19 DIAGNOSIS — L03116 Cellulitis of left lower limb: Secondary | ICD-10-CM

## 2024-07-19 DIAGNOSIS — R21 Rash and other nonspecific skin eruption: Secondary | ICD-10-CM

## 2024-07-19 DIAGNOSIS — Z6841 Body Mass Index (BMI) 40.0 and over, adult: Secondary | ICD-10-CM | POA: Diagnosis not present

## 2024-07-19 MED ORDER — PREDNISONE 10 MG PO TABS: ORAL_TABLET | ORAL | 0 refills | Status: AC

## 2024-07-19 MED ORDER — DOXYCYCLINE HYCLATE 100 MG PO TABS: 100.0000 mg | ORAL_TABLET | Freq: Two times a day (BID) | ORAL | 0 refills | Status: AC

## 2024-07-19 NOTE — Progress Notes (Signed)
 "  Subjective:    Patient ID: Mario Silva, male    DOB: 1965/08/01, 59 y.o.   MRN: [id number]  Mario Silva is a 59 y.o. male presenting on 07/19/2024 for Bleeding/Bruising (Bilateral leg redness and bruising )  Patient presents for a same day appointment.  HPI  Discussed the use of AI scribe software for clinical note transcription with the patient, who gave verbal consent to proceed.  History of Present Illness   Mario Silva is a 59 year old male who presents with a rash on his legs and other parts of his body.  Non specific Papular Cutaneous eruption - Rash onset this past weekend, initially on both legs, subsequently spreading to abdomen, chest, arms, and back - Rash is patchy, initially painful, now slightly pruritic - No associated fever, chills, or systemic symptoms - No new medications or exposures identified - No topical treatments used; Tylenol taken for discomfort  Left Lower Ext Abrasion Lower extremity skin changes - History of cellulitis with residual bruising on legs - Chronic darker spots on legs attributed to poor circulation, unchanged in appearance - Recent accidental abrasion with wound present L lower extremity - Patient has concern for cellulitis  Allergies and prior treatments - Allergic to penicillin and latex - Uncertain prior steroid use - Previous use of antifungal cream for athlete's foot (prescribed October)     Morbid Obesity BMI >42 Chronic problem with abnormal weight     07/20/2024   12:42 AM 08/26/2023    9:00 AM 08/26/2023    8:59 AM  Depression screen PHQ 2/9  Decreased Interest 1 1 0  Down, Depressed, Hopeless 1 2 0  PHQ - 2 Score 2 3 0  Altered sleeping 0 0 0  Tired, decreased energy 0 0 0  Change in appetite 0 0 0  Feeling bad or failure about yourself  0 0 0  Trouble concentrating 0 0 0  Moving slowly or fidgety/restless 0 0 0  Suicidal thoughts 0 0 0  PHQ-9 Score 2 3  0   Difficult doing work/chores Not difficult at  all Not difficult at all Not difficult at all     Data saved with a previous flowsheet row definition       06/08/2023    9:17 AM 03/19/2022    3:59 PM 09/22/2021    8:48 AM 09/12/2019    9:29 AM  GAD 7 : Generalized Anxiety Score  Nervous, Anxious, on Edge 0  0  0  0   Control/stop worrying 0  0  0  1   Worry too much - different things 0  0  0  0   Trouble relaxing 0  0  0  0   Restless 0  0  0  0   Easily annoyed or irritable 1  0  0  3   Afraid - awful might happen 0  0  0  0   Total GAD 7 Score 1 0 0 4  Anxiety Difficulty  Not difficult at all Not difficult at all Not difficult at all     Data saved with a previous flowsheet row definition    Social History[1]  Review of Systems Per HPI unless specifically indicated above     Objective:    BP 130/82 (BP Location: Left Arm, Patient Position: Sitting, Cuff Size: Large)   Pulse 90   Temp 98.1 F (36.7 C) (Oral)   Ht 6' 4 (1.93 m)  Wt (!) 346 lb 5 oz (157.1 kg)   SpO2 96%   BMI 42.15 kg/m   Wt Readings from Last 3 Encounters:  07/19/24 (!) 346 lb 5 oz (157.1 kg)  09/02/23 (!) 349 lb (158.3 kg)  06/08/23 (!) 349 lb (158.3 kg)    Physical Exam Vitals and nursing note reviewed.  Constitutional:      General: He is not in acute distress.    Appearance: Normal appearance. He is well-developed. He is not diaphoretic.     Comments: Well-appearing, comfortable, cooperative  HENT:     Head: Normocephalic and atraumatic.  Eyes:     General:        Right eye: No discharge.        Left eye: No discharge.     Conjunctiva/sclera: Conjunctivae normal.  Cardiovascular:     Rate and Rhythm: Normal rate.  Pulmonary:     Effort: Pulmonary effort is normal.  Skin:    General: Skin is warm and dry.     Findings: Rash (generalized rash most prominent on lower extremities bilateral. faint papular erythematous rash elsewhere on trunk back and upper extremities.) present. No erythema.  Neurological:     Mental Status: He is  alert and oriented to person, place, and time.  Psychiatric:        Mood and Affect: Mood normal.        Behavior: Behavior normal.        Thought Content: Thought content normal.     Comments: Well groomed, good eye contact, normal speech and thoughts              Results for orders placed or performed in visit on 07/19/24  Comprehensive metabolic panel with GFR   Collection Time: 07/19/24 10:44 AM  Result Value Ref Range   Glucose, Bld 96 65 - 139 mg/dL   BUN 17 7 - 25 mg/dL   Creat 9.12 9.29 - 8.69 mg/dL   eGFR 99 > OR = 60 fO/fpw/8.26f7   BUN/Creatinine Ratio SEE NOTE: 6 - 22 (calc)   Sodium 140 135 - 146 mmol/L   Potassium 4.6 3.5 - 5.3 mmol/L   Chloride 104 98 - 110 mmol/L   CO2 28 20 - 32 mmol/L   Calcium 9.3 8.6 - 10.3 mg/dL   Total Protein 6.9 6.1 - 8.1 g/dL   Albumin 4.3 3.6 - 5.1 g/dL   Globulin 2.6 1.9 - 3.7 g/dL (calc)   AG Ratio 1.7 1.0 - 2.5 (calc)   Total Bilirubin 0.4 0.2 - 1.2 mg/dL   Alkaline phosphatase (APISO) 74 35 - 144 U/L   AST 37 (H) 10 - 35 U/L   ALT 54 (H) 9 - 46 U/L  CBC with Differential/Platelet   Collection Time: 07/19/24 10:44 AM  Result Value Ref Range   WBC 7.2 3.8 - 10.8 Thousand/uL   RBC 5.68 4.20 - 5.80 Million/uL   Hemoglobin 15.9 13.2 - 17.1 g/dL   HCT 50.1 60.5 - 48.8 %   MCV 87.7 81.4 - 101.7 fL   MCH 28.0 27.0 - 33.0 pg   MCHC 31.9 31.6 - 35.4 g/dL   RDW 86.7 88.9 - 84.9 %   Platelets 220 140 - 400 Thousand/uL   MPV 11.0 7.5 - 12.5 fL   Neutro Abs 2,988 1,500 - 7,800 cells/uL   Absolute Lymphocytes 2,434 850 - 3,900 cells/uL   Absolute Monocytes 886 200 - 950 cells/uL   Eosinophils Absolute 835 (H) 15 - 500 cells/uL  Basophils Absolute 58 0 - 200 cells/uL   Neutrophils Relative % 41.5 %   Total Lymphocyte 33.8 %   Monocytes Relative 12.3 %   Eosinophils Relative 11.6 %   Basophils Relative 0.8 %      Assessment & Plan:   Problem List Items Addressed This Visit     Morbid obesity with BMI of 40.0-44.9,  adult (HCC)   Other Visit Diagnoses       Rash and nonspecific skin eruption    -  Primary   Relevant Medications   predniSONE  (DELTASONE ) 10 MG tablet   Other Relevant Orders   Comprehensive metabolic panel with GFR (Completed)   CBC with Differential/Platelet (Completed)     Abrasion of left lower extremity, initial encounter         Cellulitis of left lower extremity       Relevant Medications   doxycycline  (VIBRA -TABS) 100 MG tablet        Generalized rash of unclear etiology Generalized rash with unclear differential; macular rash with wide distribution primarily on lower extremity, no papular aspect to rash. No ulceration or blistering. No itching. Not consistent with hives. Seems to be improving gradually No bacterial infection or drug reaction - Prescribed 6-day steroid taper empiric therapy - Ordered blood panels to rule out systemic issues. - Advised use of moisturizer. - Instructed to contact dermatologist if no improvement in 7-10 days.  Left Lower Extremity Abrasion / Possible early cellulitis - Concern for LLE anterior leg abrasion with wound present still, concern for site of potential infection cellulitis, some erythema in this region. He has discoloration of lower extremities from prior cellulitis reported. - Prescribed doxycycline  for 7 days with specific administration instructions.  Chronic venous stasis dermatitis Chronic venous stasis dermatitis with itching, dryness, and pain. No acute changes. - Advised use of moisturizers, elevation, and compression.     Morbid Obesity BMI >42 Encouraged lifestyle modification for weight loss   Orders Placed This Encounter  Procedures   Comprehensive metabolic panel with GFR   CBC with Differential/Platelet    Meds ordered this encounter  Medications   predniSONE  (DELTASONE ) 10 MG tablet    Sig: Take 6 tabs with breakfast Day 1, 5 tabs Day 2, 4 tabs Day 3, 3 tabs Day 4, 2 tabs Day 5, 1 tab Day 6.    Dispense:   21 tablet    Refill:  0   doxycycline  (VIBRA -TABS) 100 MG tablet    Sig: Take 1 tablet (100 mg total) by mouth 2 (two) times daily. For 7 day. Take with full glass of water, stay upright 30 min after taking.    Dispense:  14 tablet    Refill:  0    Follow up plan: Return if symptoms worsen or fail to improve.  Marsa Officer, DO [facility] 07/19/2024, 10:16 AM     [1]  Social History Tobacco Use   Smoking status: Never   Smokeless tobacco: Never  Vaping Use   Vaping status: Never Used  Substance Use Topics   Alcohol use: No   Drug use: No   "

## 2024-07-19 NOTE — Patient Instructions (Addendum)
 Thank you for coming to the office today.  Possible early skin infection, sending antibiotic course for prevention.  Start taking Doxycycline  antibiotic 100mg  twice daily for 7 days. Take with full glass of water and stay upright for at least 30 min after taking, may be seated or standing, but should NOT lay down. This is just a safety precaution, if this medicine does not go all the way down throat well it could cause some burning discomfort to throat and esophagus. Also NOTE - do not take medicine within 2 hours (before or after) consuming dairy or foods / vitamins containing high calcium or iron.   Trial on steroid taper for the generalized rash.  Can use moisturizer cream or ointment.  Labs today  Call the Dermatologist office if not improving in 7-10 more days  [facility]   [address] [place], [place] [address] Phone: [phone]   Please schedule a Follow-up Appointment to: Return if symptoms worsen or fail to improve.  If you have any other questions or concerns, please feel free to call the office or send a message through MyChart. You may also schedule an earlier appointment if necessary.  Additionally, you may be receiving a survey about your experience at our office within a few days to 1 week by e-mail or mail. We value your feedback.  Marsa Officer, DO [facility], [place]

## 2024-07-20 ENCOUNTER — Ambulatory Visit: Payer: Self-pay | Admitting: Family Medicine

## 2024-07-20 LAB — CBC WITH DIFFERENTIAL/PLATELET
Absolute Lymphocytes: 2434 {cells}/uL (ref 850–3900)
Absolute Monocytes: 886 {cells}/uL (ref 200–950)
Basophils Absolute: 58 {cells}/uL (ref 0–200)
Basophils Relative: 0.8 %
Eosinophils Absolute: 835 {cells}/uL — ABNORMAL HIGH (ref 15–500)
Eosinophils Relative: 11.6 %
HCT: 49.8 % (ref 39.4–51.1)
Hemoglobin: 15.9 g/dL (ref 13.2–17.1)
MCH: 28 pg (ref 27.0–33.0)
MCHC: 31.9 g/dL (ref 31.6–35.4)
MCV: 87.7 fL (ref 81.4–101.7)
MPV: 11 fL (ref 7.5–12.5)
Monocytes Relative: 12.3 %
Neutro Abs: 2988 {cells}/uL (ref 1500–7800)
Neutrophils Relative %: 41.5 %
Platelets: 220 Thousand/uL (ref 140–400)
RBC: 5.68 Million/uL (ref 4.20–5.80)
RDW: 13.2 % (ref 11.0–15.0)
Total Lymphocyte: 33.8 %
WBC: 7.2 Thousand/uL (ref 3.8–10.8)

## 2024-07-20 LAB — COMPREHENSIVE METABOLIC PANEL WITH GFR
AG Ratio: 1.7 (calc) (ref 1.0–2.5)
ALT: 54 U/L — ABNORMAL HIGH (ref 9–46)
AST: 37 U/L — ABNORMAL HIGH (ref 10–35)
Albumin: 4.3 g/dL (ref 3.6–5.1)
Alkaline phosphatase (APISO): 74 U/L (ref 35–144)
BUN: 17 mg/dL (ref 7–25)
CO2: 28 mmol/L (ref 20–32)
Calcium: 9.3 mg/dL (ref 8.6–10.3)
Chloride: 104 mmol/L (ref 98–110)
Creat: 0.87 mg/dL (ref 0.70–1.30)
Globulin: 2.6 g/dL (ref 1.9–3.7)
Glucose, Bld: 96 mg/dL (ref 65–139)
Potassium: 4.6 mmol/L (ref 3.5–5.3)
Sodium: 140 mmol/L (ref 135–146)
Total Bilirubin: 0.4 mg/dL (ref 0.2–1.2)
Total Protein: 6.9 g/dL (ref 6.1–8.1)
eGFR: 99 mL/min/1.73m2

## 2024-08-31 ENCOUNTER — Ambulatory Visit: Payer: Medicare Other

## 2024-09-05 ENCOUNTER — Ambulatory Visit

## 2025-02-07 ENCOUNTER — Ambulatory Visit: Admitting: Dermatology
